# Patient Record
Sex: Male | Born: 1935 | Race: Black or African American | Hispanic: No | Marital: Married | Smoking: Never smoker
Health system: Southern US, Community
[De-identification: ages and names within clinical notes are randomized; demographics above are authoritative.]

## PROBLEM LIST (undated history)

## (undated) DIAGNOSIS — E785 Hyperlipidemia, unspecified: Secondary | ICD-10-CM

## (undated) DIAGNOSIS — N400 Enlarged prostate without lower urinary tract symptoms: Secondary | ICD-10-CM

## (undated) DIAGNOSIS — Z8679 Personal history of other diseases of the circulatory system: Secondary | ICD-10-CM

## (undated) DIAGNOSIS — F419 Anxiety disorder, unspecified: Secondary | ICD-10-CM

## (undated) DIAGNOSIS — I4892 Unspecified atrial flutter: Secondary | ICD-10-CM

## (undated) DIAGNOSIS — I471 Supraventricular tachycardia: Secondary | ICD-10-CM

## (undated) DIAGNOSIS — D649 Anemia, unspecified: Secondary | ICD-10-CM

## (undated) DIAGNOSIS — F0150 Vascular dementia without behavioral disturbance: Secondary | ICD-10-CM

## (undated) DIAGNOSIS — D132 Benign neoplasm of duodenum: Secondary | ICD-10-CM

## (undated) DIAGNOSIS — E119 Type 2 diabetes mellitus without complications: Secondary | ICD-10-CM

## (undated) DIAGNOSIS — T7840XA Allergy, unspecified, initial encounter: Secondary | ICD-10-CM

## (undated) DIAGNOSIS — Z9889 Other specified postprocedural states: Secondary | ICD-10-CM

## (undated) DIAGNOSIS — I10 Essential (primary) hypertension: Secondary | ICD-10-CM

## (undated) DIAGNOSIS — I5031 Acute diastolic (congestive) heart failure: Secondary | ICD-10-CM

## (undated) DIAGNOSIS — K219 Gastro-esophageal reflux disease without esophagitis: Secondary | ICD-10-CM

## (undated) DIAGNOSIS — M1990 Unspecified osteoarthritis, unspecified site: Secondary | ICD-10-CM

## (undated) DIAGNOSIS — I2510 Atherosclerotic heart disease of native coronary artery without angina pectoris: Secondary | ICD-10-CM

## (undated) HISTORY — DX: Benign prostatic hyperplasia without lower urinary tract symptoms: N40.0

## (undated) HISTORY — DX: Benign neoplasm of duodenum: D13.2

## (undated) HISTORY — DX: Hyperlipidemia, unspecified: E78.5

## (undated) HISTORY — PX: ANGIOPLASTY: SHX39

## (undated) HISTORY — DX: Supraventricular tachycardia: I47.1

## (undated) HISTORY — DX: Essential (primary) hypertension: I10

## (undated) HISTORY — DX: Type 2 diabetes mellitus without complications: E11.9

## (undated) HISTORY — DX: Unspecified atrial flutter: I48.92

## (undated) HISTORY — DX: Anxiety disorder, unspecified: F41.9

## (undated) HISTORY — DX: Anemia, unspecified: D64.9

## (undated) HISTORY — DX: Acute diastolic (congestive) heart failure: I50.31

## (undated) HISTORY — DX: Allergy, unspecified, initial encounter: T78.40XA

## (undated) HISTORY — DX: Personal history of other diseases of the circulatory system: Z86.79

## (undated) HISTORY — DX: Unspecified osteoarthritis, unspecified site: M19.90

## (undated) HISTORY — DX: Vascular dementia, unspecified severity, without behavioral disturbance, psychotic disturbance, mood disturbance, and anxiety: F01.50

## (undated) HISTORY — DX: Gastro-esophageal reflux disease without esophagitis: K21.9

## (undated) HISTORY — DX: Atherosclerotic heart disease of native coronary artery without angina pectoris: I25.10

## (undated) HISTORY — DX: Other specified postprocedural states: Z98.890

---

## 1997-05-21 ENCOUNTER — Inpatient Hospital Stay (HOSPITAL_COMMUNITY): Admission: EM | Admit: 1997-05-21 | Discharge: 1997-05-22 | Payer: Self-pay | Admitting: *Deleted

## 1998-03-09 ENCOUNTER — Encounter: Payer: Self-pay | Admitting: Internal Medicine

## 1998-03-09 LAB — CONVERTED CEMR LAB: Hgb A1c MFr Bld: 8.5 %

## 1999-07-01 DIAGNOSIS — I4892 Unspecified atrial flutter: Secondary | ICD-10-CM

## 1999-07-01 HISTORY — DX: Unspecified atrial flutter: I48.92

## 1999-07-16 ENCOUNTER — Inpatient Hospital Stay (HOSPITAL_COMMUNITY): Admission: EM | Admit: 1999-07-16 | Discharge: 1999-07-21 | Payer: Self-pay | Admitting: Emergency Medicine

## 1999-07-16 ENCOUNTER — Encounter: Payer: Self-pay | Admitting: Emergency Medicine

## 1999-07-17 ENCOUNTER — Encounter: Payer: Self-pay | Admitting: Cardiology

## 1999-07-21 ENCOUNTER — Encounter: Payer: Self-pay | Admitting: Cardiology

## 2000-01-31 HISTORY — PX: CORONARY ARTERY BYPASS GRAFT: SHX141

## 2000-02-18 ENCOUNTER — Inpatient Hospital Stay (HOSPITAL_COMMUNITY): Admission: EM | Admit: 2000-02-18 | Discharge: 2000-03-01 | Payer: Self-pay | Admitting: Emergency Medicine

## 2000-02-18 ENCOUNTER — Encounter: Payer: Self-pay | Admitting: *Deleted

## 2000-02-24 ENCOUNTER — Encounter: Payer: Self-pay | Admitting: Thoracic Surgery (Cardiothoracic Vascular Surgery)

## 2000-02-25 ENCOUNTER — Encounter: Payer: Self-pay | Admitting: Thoracic Surgery (Cardiothoracic Vascular Surgery)

## 2000-02-26 ENCOUNTER — Encounter: Payer: Self-pay | Admitting: Thoracic Surgery (Cardiothoracic Vascular Surgery)

## 2000-02-27 ENCOUNTER — Encounter: Payer: Self-pay | Admitting: Thoracic Surgery (Cardiothoracic Vascular Surgery)

## 2000-02-29 ENCOUNTER — Encounter: Payer: Self-pay | Admitting: Thoracic Surgery (Cardiothoracic Vascular Surgery)

## 2000-03-02 DIAGNOSIS — I4719 Other supraventricular tachycardia: Secondary | ICD-10-CM

## 2000-03-02 DIAGNOSIS — I471 Supraventricular tachycardia: Secondary | ICD-10-CM

## 2000-03-02 HISTORY — DX: Other supraventricular tachycardia: I47.19

## 2000-03-02 HISTORY — DX: Supraventricular tachycardia: I47.1

## 2000-03-18 ENCOUNTER — Encounter: Payer: Self-pay | Admitting: *Deleted

## 2000-03-18 ENCOUNTER — Encounter: Payer: Self-pay | Admitting: Cardiology

## 2000-03-18 ENCOUNTER — Inpatient Hospital Stay (HOSPITAL_COMMUNITY): Admission: EM | Admit: 2000-03-18 | Discharge: 2000-03-22 | Payer: Self-pay | Admitting: *Deleted

## 2000-03-28 ENCOUNTER — Encounter: Payer: Self-pay | Admitting: Thoracic Surgery (Cardiothoracic Vascular Surgery)

## 2000-03-28 ENCOUNTER — Encounter
Admission: RE | Admit: 2000-03-28 | Discharge: 2000-03-28 | Payer: Self-pay | Admitting: Thoracic Surgery (Cardiothoracic Vascular Surgery)

## 2000-06-14 ENCOUNTER — Encounter: Payer: Self-pay | Admitting: Internal Medicine

## 2000-06-14 LAB — CONVERTED CEMR LAB: Hgb A1c MFr Bld: 7 %

## 2000-07-28 ENCOUNTER — Encounter: Payer: Self-pay | Admitting: *Deleted

## 2000-07-28 ENCOUNTER — Emergency Department (HOSPITAL_COMMUNITY): Admission: EM | Admit: 2000-07-28 | Discharge: 2000-07-28 | Payer: Self-pay | Admitting: Emergency Medicine

## 2002-03-25 ENCOUNTER — Emergency Department (HOSPITAL_COMMUNITY): Admission: EM | Admit: 2002-03-25 | Discharge: 2002-03-26 | Payer: Self-pay | Admitting: Emergency Medicine

## 2002-03-26 ENCOUNTER — Encounter: Payer: Self-pay | Admitting: Emergency Medicine

## 2002-03-28 ENCOUNTER — Emergency Department (HOSPITAL_COMMUNITY): Admission: EM | Admit: 2002-03-28 | Discharge: 2002-03-29 | Payer: Self-pay | Admitting: Emergency Medicine

## 2002-03-28 ENCOUNTER — Encounter: Payer: Self-pay | Admitting: Emergency Medicine

## 2002-06-10 ENCOUNTER — Encounter: Payer: Self-pay | Admitting: Internal Medicine

## 2002-06-10 LAB — CONVERTED CEMR LAB: Hgb A1c MFr Bld: 11.2 %

## 2002-07-09 ENCOUNTER — Encounter: Payer: Self-pay | Admitting: Internal Medicine

## 2002-12-01 ENCOUNTER — Encounter: Payer: Self-pay | Admitting: Internal Medicine

## 2002-12-01 LAB — CONVERTED CEMR LAB: Hgb A1c MFr Bld: 9.8 %

## 2003-06-05 ENCOUNTER — Encounter: Payer: Self-pay | Admitting: Internal Medicine

## 2003-06-05 LAB — CONVERTED CEMR LAB: Hgb A1c MFr Bld: 9 %

## 2003-07-24 ENCOUNTER — Inpatient Hospital Stay (HOSPITAL_BASED_OUTPATIENT_CLINIC_OR_DEPARTMENT_OTHER): Admission: RE | Admit: 2003-07-24 | Discharge: 2003-07-24 | Payer: Self-pay | Admitting: Cardiology

## 2003-07-31 ENCOUNTER — Ambulatory Visit (HOSPITAL_COMMUNITY): Admission: RE | Admit: 2003-07-31 | Discharge: 2003-08-01 | Payer: Self-pay | Admitting: Cardiology

## 2003-07-31 HISTORY — PX: CORONARY STENT PLACEMENT: SHX1402

## 2003-11-24 ENCOUNTER — Inpatient Hospital Stay (HOSPITAL_COMMUNITY): Admission: EM | Admit: 2003-11-24 | Discharge: 2003-11-26 | Payer: Self-pay

## 2003-11-26 ENCOUNTER — Ambulatory Visit: Payer: Self-pay | Admitting: Cardiology

## 2003-12-02 ENCOUNTER — Ambulatory Visit: Payer: Self-pay | Admitting: Internal Medicine

## 2003-12-02 LAB — CONVERTED CEMR LAB: Hgb A1c MFr Bld: 8.8 %

## 2003-12-11 ENCOUNTER — Ambulatory Visit: Payer: Self-pay | Admitting: Cardiology

## 2003-12-15 ENCOUNTER — Ambulatory Visit: Payer: Self-pay | Admitting: *Deleted

## 2003-12-22 ENCOUNTER — Ambulatory Visit: Payer: Self-pay | Admitting: Internal Medicine

## 2003-12-26 ENCOUNTER — Emergency Department (HOSPITAL_COMMUNITY): Admission: EM | Admit: 2003-12-26 | Discharge: 2003-12-26 | Payer: Self-pay | Admitting: Emergency Medicine

## 2003-12-29 ENCOUNTER — Ambulatory Visit: Payer: Self-pay | Admitting: Cardiology

## 2003-12-31 ENCOUNTER — Ambulatory Visit: Payer: Self-pay | Admitting: Cardiology

## 2004-01-05 ENCOUNTER — Ambulatory Visit: Payer: Self-pay | Admitting: Cardiology

## 2004-01-11 ENCOUNTER — Ambulatory Visit: Payer: Self-pay | Admitting: Cardiology

## 2004-01-15 ENCOUNTER — Ambulatory Visit: Payer: Self-pay

## 2004-01-15 ENCOUNTER — Ambulatory Visit: Payer: Self-pay | Admitting: Cardiology

## 2004-01-18 ENCOUNTER — Ambulatory Visit: Payer: Self-pay | Admitting: Internal Medicine

## 2004-01-28 ENCOUNTER — Ambulatory Visit: Payer: Self-pay | Admitting: *Deleted

## 2004-02-03 ENCOUNTER — Ambulatory Visit: Payer: Self-pay | Admitting: Family Medicine

## 2004-02-15 ENCOUNTER — Ambulatory Visit: Payer: Self-pay | Admitting: Cardiovascular Disease

## 2004-03-10 ENCOUNTER — Ambulatory Visit: Payer: Self-pay | Admitting: Cardiology

## 2004-03-14 ENCOUNTER — Ambulatory Visit: Payer: Self-pay | Admitting: Internal Medicine

## 2004-04-14 ENCOUNTER — Ambulatory Visit: Payer: Self-pay | Admitting: Cardiology

## 2004-04-14 ENCOUNTER — Ambulatory Visit: Payer: Self-pay | Admitting: *Deleted

## 2004-04-19 ENCOUNTER — Ambulatory Visit: Payer: Self-pay | Admitting: Internal Medicine

## 2004-04-19 ENCOUNTER — Ambulatory Visit: Payer: Self-pay | Admitting: Cardiology

## 2004-04-20 ENCOUNTER — Inpatient Hospital Stay (HOSPITAL_BASED_OUTPATIENT_CLINIC_OR_DEPARTMENT_OTHER): Admission: RE | Admit: 2004-04-20 | Discharge: 2004-04-20 | Payer: Self-pay | Admitting: Cardiology

## 2004-04-20 ENCOUNTER — Ambulatory Visit: Payer: Self-pay | Admitting: Cardiology

## 2004-04-27 ENCOUNTER — Ambulatory Visit: Payer: Self-pay | Admitting: *Deleted

## 2004-05-09 ENCOUNTER — Ambulatory Visit: Payer: Self-pay | Admitting: Cardiology

## 2004-05-17 ENCOUNTER — Ambulatory Visit: Payer: Self-pay | Admitting: Internal Medicine

## 2004-05-23 ENCOUNTER — Ambulatory Visit: Payer: Self-pay | Admitting: Cardiology

## 2004-06-01 ENCOUNTER — Ambulatory Visit: Payer: Self-pay | Admitting: Cardiology

## 2004-06-13 ENCOUNTER — Ambulatory Visit: Payer: Self-pay | Admitting: Cardiology

## 2004-06-28 ENCOUNTER — Ambulatory Visit: Payer: Self-pay | Admitting: *Deleted

## 2004-07-21 ENCOUNTER — Ambulatory Visit: Payer: Self-pay | Admitting: Cardiology

## 2004-08-22 ENCOUNTER — Ambulatory Visit: Payer: Self-pay | Admitting: Cardiology

## 2004-09-08 ENCOUNTER — Ambulatory Visit: Payer: Self-pay | Admitting: Internal Medicine

## 2004-09-19 ENCOUNTER — Ambulatory Visit: Payer: Self-pay | Admitting: Cardiology

## 2004-09-29 ENCOUNTER — Ambulatory Visit: Payer: Self-pay | Admitting: Internal Medicine

## 2004-10-20 ENCOUNTER — Ambulatory Visit: Payer: Self-pay | Admitting: Cardiology

## 2004-10-28 ENCOUNTER — Ambulatory Visit: Payer: Self-pay | Admitting: Cardiology

## 2004-11-25 ENCOUNTER — Ambulatory Visit: Payer: Self-pay

## 2004-11-25 ENCOUNTER — Ambulatory Visit: Payer: Self-pay | Admitting: Internal Medicine

## 2004-12-12 ENCOUNTER — Ambulatory Visit: Payer: Self-pay | Admitting: Cardiology

## 2004-12-28 ENCOUNTER — Ambulatory Visit: Payer: Self-pay | Admitting: Cardiology

## 2005-01-04 ENCOUNTER — Ambulatory Visit: Payer: Self-pay | Admitting: Cardiology

## 2005-01-13 ENCOUNTER — Ambulatory Visit: Payer: Self-pay | Admitting: Internal Medicine

## 2005-01-19 ENCOUNTER — Ambulatory Visit: Payer: Self-pay | Admitting: Cardiology

## 2005-01-31 ENCOUNTER — Ambulatory Visit: Payer: Self-pay | Admitting: *Deleted

## 2005-02-14 ENCOUNTER — Ambulatory Visit: Payer: Self-pay | Admitting: Cardiology

## 2005-02-14 ENCOUNTER — Inpatient Hospital Stay (HOSPITAL_COMMUNITY): Admission: EM | Admit: 2005-02-14 | Discharge: 2005-02-17 | Payer: Self-pay | Admitting: Emergency Medicine

## 2005-02-20 ENCOUNTER — Ambulatory Visit: Payer: Self-pay | Admitting: Internal Medicine

## 2005-03-01 ENCOUNTER — Ambulatory Visit: Payer: Self-pay | Admitting: Cardiology

## 2005-03-01 ENCOUNTER — Ambulatory Visit: Payer: Self-pay

## 2005-03-16 ENCOUNTER — Ambulatory Visit: Payer: Self-pay | Admitting: Cardiology

## 2005-03-16 LAB — CONVERTED CEMR LAB
BUN: 30 mg/dL — ABNORMAL HIGH (ref 6–23)
CO2: 28 meq/L (ref 19–32)
Calcium: 9.6 mg/dL (ref 8.4–10.5)
Chloride: 102 meq/L (ref 96–112)
Creatinine, Ser: 2.1 mg/dL — ABNORMAL HIGH (ref 0.5–1.7)
Glucose, Bld: 154 mg/dL — ABNORMAL HIGH (ref 70–99)
Potassium: 4.5 meq/L (ref 3.5–5.5)
Sodium: 136 meq/L (ref 135–145)

## 2005-03-17 ENCOUNTER — Ambulatory Visit: Payer: Self-pay | Admitting: Cardiology

## 2005-03-17 ENCOUNTER — Inpatient Hospital Stay (HOSPITAL_COMMUNITY): Admission: EM | Admit: 2005-03-17 | Discharge: 2005-03-21 | Payer: Self-pay | Admitting: Emergency Medicine

## 2005-03-20 ENCOUNTER — Ambulatory Visit: Payer: Self-pay | Admitting: Internal Medicine

## 2005-03-24 ENCOUNTER — Ambulatory Visit: Payer: Self-pay | Admitting: Internal Medicine

## 2005-03-29 ENCOUNTER — Ambulatory Visit: Payer: Self-pay | Admitting: Cardiology

## 2005-04-13 ENCOUNTER — Ambulatory Visit: Payer: Self-pay | Admitting: Cardiology

## 2005-04-21 ENCOUNTER — Ambulatory Visit: Payer: Self-pay | Admitting: Internal Medicine

## 2005-04-26 ENCOUNTER — Encounter: Payer: Self-pay | Admitting: Internal Medicine

## 2005-04-26 ENCOUNTER — Ambulatory Visit: Payer: Self-pay | Admitting: Internal Medicine

## 2005-06-02 ENCOUNTER — Ambulatory Visit: Payer: Self-pay | Admitting: Internal Medicine

## 2005-07-01 ENCOUNTER — Observation Stay (HOSPITAL_COMMUNITY): Admission: EM | Admit: 2005-07-01 | Discharge: 2005-07-01 | Payer: Self-pay | Admitting: Emergency Medicine

## 2005-07-01 ENCOUNTER — Ambulatory Visit: Payer: Self-pay | Admitting: Cardiology

## 2005-07-06 ENCOUNTER — Ambulatory Visit: Payer: Self-pay

## 2005-07-14 ENCOUNTER — Ambulatory Visit: Payer: Self-pay | Admitting: Family Medicine

## 2005-08-11 ENCOUNTER — Ambulatory Visit: Payer: Self-pay | Admitting: Internal Medicine

## 2005-09-22 ENCOUNTER — Ambulatory Visit: Payer: Self-pay | Admitting: Cardiology

## 2005-09-25 ENCOUNTER — Ambulatory Visit: Payer: Self-pay | Admitting: Cardiology

## 2005-09-27 ENCOUNTER — Ambulatory Visit: Payer: Self-pay | Admitting: Cardiology

## 2005-09-28 ENCOUNTER — Ambulatory Visit: Payer: Self-pay | Admitting: Internal Medicine

## 2005-10-20 ENCOUNTER — Ambulatory Visit: Payer: Self-pay | Admitting: Cardiology

## 2005-12-30 HISTORY — PX: CIRCUMCISION: SUR203

## 2006-01-18 ENCOUNTER — Ambulatory Visit: Payer: Self-pay | Admitting: Family Medicine

## 2006-02-08 ENCOUNTER — Ambulatory Visit: Payer: Self-pay | Admitting: Internal Medicine

## 2006-02-09 ENCOUNTER — Encounter: Payer: Self-pay | Admitting: Internal Medicine

## 2006-02-09 LAB — CONVERTED CEMR LAB
ALT: 58 units/L — ABNORMAL HIGH (ref 0–40)
Albumin: 3.3 g/dL — ABNORMAL LOW (ref 3.5–5.2)
BUN: 17 mg/dL (ref 6–23)
CO2: 29 meq/L (ref 19–32)
Calcium: 8.9 mg/dL (ref 8.4–10.5)
Chloride: 111 meq/L (ref 96–112)
Chol/HDL Ratio, serum: 5
Cholesterol: 162 mg/dL (ref 0–200)
Creatinine, Ser: 1.8 mg/dL — ABNORMAL HIGH (ref 0.4–1.5)
GFR calc non Af Amer: 40 mL/min
Glomerular Filtration Rate, Af Am: 48 mL/min/{1.73_m2}
Glucose, Bld: 97 mg/dL (ref 70–99)
HDL: 32.3 mg/dL — ABNORMAL LOW (ref >39.0)
Hgb A1c MFr Bld: 8.1 %
Hgb A1c MFr Bld: 8.1 % — ABNORMAL HIGH (ref 4.6–6.0)
LDL Cholesterol: 104 mg/dL — ABNORMAL HIGH (ref 0–99)
Phosphorus Concentration: 2.7 mg/dL (ref 2.3–4.6)
Potassium: 4.7 meq/L (ref 3.5–5.1)
Sodium: 143 meq/L (ref 135–145)
Triglyceride fasting, serum: 129 mg/dL (ref 0–149)
VLDL: 26 mg/dL (ref 0–40)

## 2006-03-19 ENCOUNTER — Ambulatory Visit: Payer: Self-pay | Admitting: Internal Medicine

## 2006-04-19 ENCOUNTER — Ambulatory Visit: Payer: Self-pay | Admitting: Internal Medicine

## 2006-05-28 ENCOUNTER — Ambulatory Visit: Payer: Self-pay | Admitting: Internal Medicine

## 2006-05-28 DIAGNOSIS — I25810 Atherosclerosis of coronary artery bypass graft(s) without angina pectoris: Secondary | ICD-10-CM | POA: Insufficient documentation

## 2006-05-28 LAB — CONVERTED CEMR LAB
INR: 1.3
INR: 1.3
Prothrombin Time: 13.9 s
Prothrombin Time: 13.9 s

## 2006-05-29 ENCOUNTER — Encounter: Payer: Self-pay | Admitting: Internal Medicine

## 2006-05-29 DIAGNOSIS — D126 Benign neoplasm of colon, unspecified: Secondary | ICD-10-CM

## 2006-05-29 DIAGNOSIS — K219 Gastro-esophageal reflux disease without esophagitis: Secondary | ICD-10-CM | POA: Insufficient documentation

## 2006-05-29 DIAGNOSIS — F411 Generalized anxiety disorder: Secondary | ICD-10-CM | POA: Insufficient documentation

## 2006-05-29 DIAGNOSIS — E119 Type 2 diabetes mellitus without complications: Secondary | ICD-10-CM | POA: Insufficient documentation

## 2006-05-29 DIAGNOSIS — E7800 Pure hypercholesterolemia, unspecified: Secondary | ICD-10-CM

## 2006-05-29 DIAGNOSIS — M479 Spondylosis, unspecified: Secondary | ICD-10-CM | POA: Insufficient documentation

## 2006-05-29 DIAGNOSIS — I10 Essential (primary) hypertension: Secondary | ICD-10-CM | POA: Insufficient documentation

## 2006-05-29 DIAGNOSIS — J309 Allergic rhinitis, unspecified: Secondary | ICD-10-CM | POA: Insufficient documentation

## 2006-06-05 ENCOUNTER — Ambulatory Visit: Payer: Self-pay | Admitting: Internal Medicine

## 2006-06-05 ENCOUNTER — Ambulatory Visit: Payer: Self-pay | Admitting: *Deleted

## 2006-06-05 DIAGNOSIS — I80299 Phlebitis and thrombophlebitis of other deep vessels of unspecified lower extremity: Secondary | ICD-10-CM

## 2006-06-11 ENCOUNTER — Telehealth (INDEPENDENT_AMBULATORY_CARE_PROVIDER_SITE_OTHER): Payer: Self-pay | Admitting: *Deleted

## 2006-06-12 ENCOUNTER — Ambulatory Visit: Payer: Self-pay | Admitting: Internal Medicine

## 2006-06-12 LAB — CONVERTED CEMR LAB
INR: 2.2
Prothrombin Time: 18.3 s

## 2006-06-18 ENCOUNTER — Ambulatory Visit: Payer: Self-pay | Admitting: Internal Medicine

## 2006-06-18 DIAGNOSIS — M1990 Unspecified osteoarthritis, unspecified site: Secondary | ICD-10-CM | POA: Insufficient documentation

## 2006-06-27 ENCOUNTER — Ambulatory Visit: Payer: Self-pay | Admitting: Internal Medicine

## 2006-06-27 LAB — CONVERTED CEMR LAB
INR: 2.6
Prothrombin Time: 19.5 s

## 2006-07-25 ENCOUNTER — Ambulatory Visit: Payer: Self-pay | Admitting: Internal Medicine

## 2006-07-25 LAB — CONVERTED CEMR LAB
INR: 2.3
Prothrombin Time: 18.5 s

## 2006-08-27 ENCOUNTER — Ambulatory Visit: Payer: Self-pay | Admitting: Internal Medicine

## 2006-08-27 LAB — CONVERTED CEMR LAB
INR: 2.6
Prothrombin Time: 19.4 s

## 2006-10-22 ENCOUNTER — Ambulatory Visit: Payer: Self-pay | Admitting: Internal Medicine

## 2006-11-20 ENCOUNTER — Ambulatory Visit: Payer: Self-pay | Admitting: Internal Medicine

## 2006-11-20 LAB — CONVERTED CEMR LAB
INR: 3
Prothrombin Time: 21 s

## 2006-12-07 ENCOUNTER — Ambulatory Visit: Payer: Self-pay | Admitting: Internal Medicine

## 2006-12-17 ENCOUNTER — Ambulatory Visit: Payer: Self-pay | Admitting: Internal Medicine

## 2006-12-17 LAB — CONVERTED CEMR LAB
INR: 2.3
Prothrombin Time: 18.5 s

## 2007-01-21 ENCOUNTER — Ambulatory Visit: Payer: Self-pay | Admitting: Internal Medicine

## 2007-01-21 LAB — CONVERTED CEMR LAB
INR: 2.5
Prothrombin Time: 19.3 s

## 2007-01-23 LAB — CONVERTED CEMR LAB
ALT: 109 units/L — ABNORMAL HIGH (ref 0–53)
Albumin: 3.4 g/dL — ABNORMAL LOW (ref 3.5–5.2)
BUN: 19 mg/dL (ref 6–23)
CO2: 29 meq/L (ref 19–32)
Calcium: 9.2 mg/dL (ref 8.4–10.5)
Chloride: 104 meq/L (ref 96–112)
Cholesterol: 181 mg/dL (ref 0–200)
Creatinine, Ser: 1.4 mg/dL (ref 0.4–1.5)
GFR calc non Af Amer: 53 mL/min
Hgb A1c MFr Bld: 8 % — ABNORMAL HIGH (ref 4.6–6.0)
Phosphorus: 2.7 mg/dL (ref 2.3–4.6)
Total CHOL/HDL Ratio: 4.8

## 2007-02-11 ENCOUNTER — Ambulatory Visit: Payer: Self-pay | Admitting: Internal Medicine

## 2007-02-11 DIAGNOSIS — R945 Abnormal results of liver function studies: Secondary | ICD-10-CM | POA: Insufficient documentation

## 2007-02-12 ENCOUNTER — Encounter (INDEPENDENT_AMBULATORY_CARE_PROVIDER_SITE_OTHER): Payer: Self-pay | Admitting: *Deleted

## 2007-02-12 LAB — CONVERTED CEMR LAB
ALT: 141 units/L — ABNORMAL HIGH (ref 0–53)
Albumin: 3.5 g/dL (ref 3.5–5.2)
Alkaline Phosphatase: 90 units/L (ref 39–117)
Total Protein: 7.1 g/dL (ref 6.0–8.3)

## 2007-02-13 ENCOUNTER — Ambulatory Visit: Payer: Self-pay | Admitting: Cardiology

## 2007-02-22 ENCOUNTER — Ambulatory Visit: Payer: Self-pay | Admitting: Cardiology

## 2007-03-01 ENCOUNTER — Ambulatory Visit: Payer: Self-pay | Admitting: Internal Medicine

## 2007-03-01 LAB — CONVERTED CEMR LAB: INR: 2

## 2007-03-04 ENCOUNTER — Encounter: Payer: Self-pay | Admitting: Internal Medicine

## 2007-03-29 ENCOUNTER — Ambulatory Visit: Payer: Self-pay | Admitting: Internal Medicine

## 2007-03-29 LAB — CONVERTED CEMR LAB
INR: 1.5
Prothrombin Time: 15.1 s

## 2007-04-08 ENCOUNTER — Ambulatory Visit: Payer: Self-pay | Admitting: Internal Medicine

## 2007-04-08 LAB — CONVERTED CEMR LAB: INR: 1.8

## 2007-04-15 ENCOUNTER — Ambulatory Visit: Payer: Self-pay | Admitting: Internal Medicine

## 2007-04-15 ENCOUNTER — Telehealth (INDEPENDENT_AMBULATORY_CARE_PROVIDER_SITE_OTHER): Payer: Self-pay | Admitting: *Deleted

## 2007-04-22 ENCOUNTER — Telehealth (INDEPENDENT_AMBULATORY_CARE_PROVIDER_SITE_OTHER): Payer: Self-pay | Admitting: *Deleted

## 2007-05-01 LAB — HM DIABETES EYE EXAM: HM Diabetic Eye Exam: NORMAL

## 2007-05-31 ENCOUNTER — Ambulatory Visit: Payer: Self-pay | Admitting: Internal Medicine

## 2007-06-01 LAB — CONVERTED CEMR LAB
Albumin: 3.9 g/dL (ref 3.5–5.2)
BUN: 17 mg/dL (ref 6–23)
CO2: 23 meq/L (ref 19–32)
Chloride: 106 meq/L (ref 96–112)
Creatinine, Ser: 1.76 mg/dL — ABNORMAL HIGH (ref 0.40–1.50)
Phosphorus: 2.8 mg/dL (ref 2.3–4.6)

## 2007-06-03 LAB — CONVERTED CEMR LAB
ALT: 81 units/L — ABNORMAL HIGH (ref 0–53)
AST: 82 units/L — ABNORMAL HIGH (ref 0–37)
Albumin: 3.9 g/dL (ref 3.5–5.2)
Alkaline Phosphatase: 85 units/L (ref 39–117)
Hgb A1c MFr Bld: 8 % — ABNORMAL HIGH (ref 4.6–6.1)
Total Bilirubin: 0.4 mg/dL (ref 0.3–1.2)

## 2007-06-27 ENCOUNTER — Telehealth (INDEPENDENT_AMBULATORY_CARE_PROVIDER_SITE_OTHER): Payer: Self-pay | Admitting: *Deleted

## 2007-07-01 ENCOUNTER — Telehealth (INDEPENDENT_AMBULATORY_CARE_PROVIDER_SITE_OTHER): Payer: Self-pay | Admitting: *Deleted

## 2007-07-19 ENCOUNTER — Encounter: Payer: Self-pay | Admitting: Internal Medicine

## 2007-08-15 ENCOUNTER — Ambulatory Visit: Payer: Self-pay | Admitting: Family Medicine

## 2007-08-30 ENCOUNTER — Ambulatory Visit: Payer: Self-pay | Admitting: Cardiology

## 2007-08-30 LAB — CONVERTED CEMR LAB
AST: 60 units/L — ABNORMAL HIGH (ref 0–37)
Alkaline Phosphatase: 81 units/L (ref 39–117)
Bilirubin, Direct: 0.1 mg/dL (ref 0.0–0.3)
Lipase: 32 units/L (ref 11.0–59.0)
Total CHOL/HDL Ratio: 6.8
VLDL: 23 mg/dL (ref 0–40)

## 2007-09-02 ENCOUNTER — Ambulatory Visit (HOSPITAL_COMMUNITY): Admission: RE | Admit: 2007-09-02 | Discharge: 2007-09-02 | Payer: Self-pay | Admitting: Cardiology

## 2007-09-02 ENCOUNTER — Ambulatory Visit: Payer: Self-pay | Admitting: Cardiology

## 2007-09-02 ENCOUNTER — Inpatient Hospital Stay (HOSPITAL_COMMUNITY): Admission: EM | Admit: 2007-09-02 | Discharge: 2007-09-06 | Payer: Self-pay | Admitting: Emergency Medicine

## 2007-09-12 DIAGNOSIS — R1011 Right upper quadrant pain: Secondary | ICD-10-CM

## 2007-09-12 DIAGNOSIS — K5730 Diverticulosis of large intestine without perforation or abscess without bleeding: Secondary | ICD-10-CM | POA: Insufficient documentation

## 2007-09-12 DIAGNOSIS — N259 Disorder resulting from impaired renal tubular function, unspecified: Secondary | ICD-10-CM | POA: Insufficient documentation

## 2007-09-13 ENCOUNTER — Ambulatory Visit: Payer: Self-pay | Admitting: Family Medicine

## 2007-09-13 LAB — CONVERTED CEMR LAB: Prothrombin Time: 13.6 s

## 2007-09-17 ENCOUNTER — Ambulatory Visit: Payer: Self-pay | Admitting: Internal Medicine

## 2007-10-03 ENCOUNTER — Ambulatory Visit: Payer: Self-pay | Admitting: Internal Medicine

## 2007-10-09 ENCOUNTER — Ambulatory Visit: Payer: Self-pay | Admitting: Cardiology

## 2007-10-17 ENCOUNTER — Ambulatory Visit: Payer: Self-pay | Admitting: Internal Medicine

## 2007-10-17 LAB — CONVERTED CEMR LAB
INR: 1.5
Prothrombin Time: 15.1 s

## 2007-10-25 ENCOUNTER — Ambulatory Visit: Payer: Self-pay | Admitting: Internal Medicine

## 2007-10-31 ENCOUNTER — Ambulatory Visit: Payer: Self-pay | Admitting: Internal Medicine

## 2007-10-31 LAB — CONVERTED CEMR LAB: Prothrombin Time: 22 s

## 2007-11-28 ENCOUNTER — Ambulatory Visit: Payer: Self-pay | Admitting: Internal Medicine

## 2007-11-28 LAB — CONVERTED CEMR LAB: INR: 2.2

## 2007-12-30 ENCOUNTER — Ambulatory Visit: Payer: Self-pay | Admitting: Internal Medicine

## 2007-12-30 LAB — CONVERTED CEMR LAB
INR: 2.1
Prothrombin Time: 17.8 s

## 2007-12-31 DIAGNOSIS — D132 Benign neoplasm of duodenum: Secondary | ICD-10-CM

## 2007-12-31 HISTORY — DX: Benign neoplasm of duodenum: D13.2

## 2008-01-07 ENCOUNTER — Ambulatory Visit: Payer: Self-pay | Admitting: Internal Medicine

## 2008-01-21 ENCOUNTER — Ambulatory Visit: Payer: Self-pay | Admitting: Internal Medicine

## 2008-01-21 ENCOUNTER — Encounter: Payer: Self-pay | Admitting: Internal Medicine

## 2008-01-21 LAB — HM COLONOSCOPY

## 2008-01-22 ENCOUNTER — Encounter: Payer: Self-pay | Admitting: Internal Medicine

## 2008-01-22 ENCOUNTER — Telehealth: Payer: Self-pay | Admitting: Internal Medicine

## 2008-01-22 ENCOUNTER — Telehealth (INDEPENDENT_AMBULATORY_CARE_PROVIDER_SITE_OTHER): Payer: Self-pay | Admitting: *Deleted

## 2008-01-27 ENCOUNTER — Ambulatory Visit: Payer: Self-pay | Admitting: Internal Medicine

## 2008-01-27 ENCOUNTER — Telehealth (INDEPENDENT_AMBULATORY_CARE_PROVIDER_SITE_OTHER): Payer: Self-pay | Admitting: *Deleted

## 2008-01-27 ENCOUNTER — Telehealth: Payer: Self-pay | Admitting: Internal Medicine

## 2008-01-27 LAB — CONVERTED CEMR LAB
INR: 1.7
Prothrombin Time: 15.9 s

## 2008-02-03 ENCOUNTER — Ambulatory Visit: Payer: Self-pay | Admitting: Internal Medicine

## 2008-02-07 ENCOUNTER — Ambulatory Visit: Payer: Self-pay | Admitting: Internal Medicine

## 2008-02-07 LAB — CONVERTED CEMR LAB: INR: 2.1

## 2008-02-12 ENCOUNTER — Ambulatory Visit: Payer: Self-pay | Admitting: Internal Medicine

## 2008-02-13 ENCOUNTER — Encounter: Payer: Self-pay | Admitting: Internal Medicine

## 2008-02-13 LAB — CONVERTED CEMR LAB
AST: 51 units/L — ABNORMAL HIGH (ref 0–37)
Alkaline Phosphatase: 74 units/L (ref 39–117)
Basophils Absolute: 0 10*3/uL (ref 0.0–0.1)
Basophils Relative: 0.6 % (ref 0.0–3.0)
CO2: 29 meq/L (ref 19–32)
Calcium: 9.6 mg/dL (ref 8.4–10.5)
Creatinine, Ser: 1.6 mg/dL — ABNORMAL HIGH (ref 0.4–1.5)
Direct LDL: 172.4 mg/dL
Glucose, Bld: 198 mg/dL — ABNORMAL HIGH (ref 70–99)
HDL: 37.2 mg/dL — ABNORMAL LOW (ref >39.0)
Hgb A1c MFr Bld: 8.7 % — ABNORMAL HIGH (ref 4.6–6.0)
Lymphocytes Relative: 21.2 % (ref 12.0–46.0)
MCHC: 34.8 g/dL (ref 30.0–36.0)
Neutrophils Relative %: 67.3 % (ref 43.0–77.0)
RBC: 4.24 M/uL (ref 4.22–5.81)
Total Bilirubin: 0.7 mg/dL (ref 0.3–1.2)
Total CHOL/HDL Ratio: 6.5
VLDL: 29 mg/dL (ref 0–40)

## 2008-02-21 ENCOUNTER — Ambulatory Visit: Payer: Self-pay | Admitting: Internal Medicine

## 2008-02-21 ENCOUNTER — Telehealth: Payer: Self-pay | Admitting: Internal Medicine

## 2008-02-21 LAB — CONVERTED CEMR LAB
INR: 2.6
Prothrombin Time: 19.5 s

## 2008-03-02 ENCOUNTER — Telehealth: Payer: Self-pay | Admitting: Internal Medicine

## 2008-03-03 ENCOUNTER — Encounter: Payer: Self-pay | Admitting: Internal Medicine

## 2008-03-13 ENCOUNTER — Encounter (INDEPENDENT_AMBULATORY_CARE_PROVIDER_SITE_OTHER): Payer: Self-pay | Admitting: *Deleted

## 2008-03-27 ENCOUNTER — Ambulatory Visit: Payer: Self-pay | Admitting: Internal Medicine

## 2008-04-05 DIAGNOSIS — I5032 Chronic diastolic (congestive) heart failure: Secondary | ICD-10-CM

## 2008-04-06 ENCOUNTER — Ambulatory Visit: Payer: Self-pay | Admitting: Cardiology

## 2008-04-06 ENCOUNTER — Encounter: Payer: Self-pay | Admitting: Cardiology

## 2008-04-24 ENCOUNTER — Telehealth: Payer: Self-pay | Admitting: Internal Medicine

## 2008-04-24 ENCOUNTER — Ambulatory Visit: Payer: Self-pay | Admitting: Internal Medicine

## 2008-05-25 ENCOUNTER — Telehealth: Payer: Self-pay | Admitting: Internal Medicine

## 2008-05-25 ENCOUNTER — Ambulatory Visit: Payer: Self-pay | Admitting: Internal Medicine

## 2008-05-26 ENCOUNTER — Encounter: Payer: Self-pay | Admitting: Internal Medicine

## 2008-06-04 ENCOUNTER — Encounter: Payer: Self-pay | Admitting: Internal Medicine

## 2008-06-08 ENCOUNTER — Ambulatory Visit: Payer: Self-pay | Admitting: Internal Medicine

## 2008-06-08 LAB — CONVERTED CEMR LAB
INR: 4.9
Prothrombin Time: 26.7 s

## 2008-06-12 ENCOUNTER — Ambulatory Visit: Payer: Self-pay | Admitting: Internal Medicine

## 2008-06-15 LAB — CONVERTED CEMR LAB
Albumin: 3.4 g/dL — ABNORMAL LOW (ref 3.5–5.2)
BUN: 19 mg/dL (ref 6–23)
CO2: 31 meq/L (ref 19–32)
Chloride: 107 meq/L (ref 96–112)
Cholesterol: 154 mg/dL (ref 0–200)
Total CHOL/HDL Ratio: 4
Triglycerides: 67 mg/dL (ref 0.0–149.0)

## 2008-07-01 ENCOUNTER — Ambulatory Visit: Payer: Self-pay | Admitting: Internal Medicine

## 2008-07-01 LAB — CONVERTED CEMR LAB: Prothrombin Time: 22 s

## 2008-07-14 ENCOUNTER — Ambulatory Visit: Payer: Self-pay | Admitting: Family Medicine

## 2008-07-14 LAB — CONVERTED CEMR LAB
Basophils Relative: 0 % (ref 0.0–3.0)
Eosinophils Relative: 1.3 % (ref 0.0–5.0)
GFR calc non Af Amer: 37.68 mL/min (ref >60)
Glucose, Bld: 98 mg/dL (ref 70–99)
Glucose, Urine, Semiquant: NEGATIVE
HCT: 35.9 % — ABNORMAL LOW (ref 39.0–52.0)
Hemoglobin: 12.3 g/dL — ABNORMAL LOW (ref 13.0–17.0)
Ketones, urine, test strip: NEGATIVE
Lymphocytes Relative: 22.8 % (ref 12.0–46.0)
Lymphs Abs: 1.3 10*3/uL (ref 0.7–4.0)
Monocytes Relative: 9.7 % (ref 3.0–12.0)
Neutro Abs: 3.9 10*3/uL (ref 1.4–7.7)
Potassium: 4.3 meq/L (ref 3.5–5.1)
RBC: 4.02 M/uL — ABNORMAL LOW (ref 4.22–5.81)
RDW: 14.4 % (ref 11.5–14.6)
Sodium: 140 meq/L (ref 135–145)
Urobilinogen, UA: 0.2
WBC: 5.9 10*3/uL (ref 4.5–10.5)
pH: 6

## 2008-07-15 ENCOUNTER — Encounter: Payer: Self-pay | Admitting: Family Medicine

## 2008-07-20 ENCOUNTER — Ambulatory Visit: Payer: Self-pay | Admitting: Internal Medicine

## 2008-07-20 ENCOUNTER — Telehealth: Payer: Self-pay | Admitting: Internal Medicine

## 2008-07-20 LAB — CONVERTED CEMR LAB
Bilirubin Urine: NEGATIVE
Ketones, urine, test strip: NEGATIVE
Protein, U semiquant: NEGATIVE
RBC / HPF: 0
WBC, UA: 0 cells/hpf

## 2008-07-30 ENCOUNTER — Ambulatory Visit: Payer: Self-pay | Admitting: Internal Medicine

## 2008-07-30 LAB — CONVERTED CEMR LAB: Prothrombin Time: 20.5 s

## 2008-08-07 ENCOUNTER — Ambulatory Visit: Payer: Self-pay | Admitting: Family Medicine

## 2008-08-07 LAB — CONVERTED CEMR LAB: Rapid Strep: NEGATIVE

## 2008-08-22 ENCOUNTER — Inpatient Hospital Stay (HOSPITAL_COMMUNITY): Admission: EM | Admit: 2008-08-22 | Discharge: 2008-08-24 | Payer: Self-pay | Admitting: Emergency Medicine

## 2008-08-22 ENCOUNTER — Ambulatory Visit: Payer: Self-pay | Admitting: Internal Medicine

## 2008-08-27 ENCOUNTER — Ambulatory Visit: Payer: Self-pay | Admitting: Internal Medicine

## 2008-08-27 LAB — CONVERTED CEMR LAB
INR: 1.9
Prothrombin Time: 16.8 s

## 2008-09-03 ENCOUNTER — Ambulatory Visit: Payer: Self-pay | Admitting: Internal Medicine

## 2008-09-03 LAB — CONVERTED CEMR LAB: INR: 3.5

## 2008-09-17 ENCOUNTER — Encounter: Payer: Self-pay | Admitting: Internal Medicine

## 2008-09-17 ENCOUNTER — Ambulatory Visit: Payer: Self-pay | Admitting: Internal Medicine

## 2008-09-17 LAB — CONVERTED CEMR LAB
INR: 6.7
Prothrombin Time: 69.3 s (ref 9.1–11.7)
Prothrombin Time: 31.2 s

## 2008-09-21 ENCOUNTER — Ambulatory Visit: Payer: Self-pay | Admitting: Internal Medicine

## 2008-09-28 ENCOUNTER — Ambulatory Visit: Payer: Self-pay | Admitting: Internal Medicine

## 2008-10-26 ENCOUNTER — Ambulatory Visit: Payer: Self-pay | Admitting: Cardiology

## 2008-10-26 DIAGNOSIS — I7389 Other specified peripheral vascular diseases: Secondary | ICD-10-CM

## 2008-10-30 ENCOUNTER — Ambulatory Visit: Payer: Self-pay

## 2008-10-30 ENCOUNTER — Encounter: Payer: Self-pay | Admitting: Cardiology

## 2008-11-02 ENCOUNTER — Ambulatory Visit: Payer: Self-pay | Admitting: Internal Medicine

## 2008-11-02 DIAGNOSIS — N400 Enlarged prostate without lower urinary tract symptoms: Secondary | ICD-10-CM | POA: Insufficient documentation

## 2008-11-02 LAB — HM DIABETES FOOT EXAM

## 2008-11-03 LAB — CONVERTED CEMR LAB
Bilirubin, Direct: 0 mg/dL (ref 0.0–0.3)
CO2: 28 meq/L (ref 19–32)
Chloride: 107 meq/L (ref 96–112)
Cholesterol: 163 mg/dL (ref 0–200)
Eosinophils Relative: 1.5 % (ref 0.0–5.0)
GFR calc non Af Amer: 27.37 mL/min (ref >60)
HCT: 33.7 % — ABNORMAL LOW (ref 39.0–52.0)
Hemoglobin: 11.4 g/dL — ABNORMAL LOW (ref 13.0–17.0)
Hgb A1c MFr Bld: 7.7 % — ABNORMAL HIGH (ref 4.6–6.5)
LDL Cholesterol: 106 mg/dL — ABNORMAL HIGH (ref 0–99)
Lymphs Abs: 1.1 10*3/uL (ref 0.7–4.0)
Monocytes Relative: 9.3 % (ref 3.0–12.0)
Neutro Abs: 4.4 10*3/uL (ref 1.4–7.7)
Platelets: 157 10*3/uL (ref 150.0–400.0)
Potassium: 4.6 meq/L (ref 3.5–5.1)
RBC: 3.76 M/uL — ABNORMAL LOW (ref 4.22–5.81)
Sodium: 140 meq/L (ref 135–145)
Total Protein: 7 g/dL (ref 6.0–8.3)
VLDL: 23 mg/dL (ref 0.0–40.0)
WBC: 6.2 10*3/uL (ref 4.5–10.5)

## 2008-11-11 ENCOUNTER — Encounter: Payer: Self-pay | Admitting: Internal Medicine

## 2008-11-16 ENCOUNTER — Ambulatory Visit: Payer: Self-pay | Admitting: Cardiovascular Disease

## 2008-11-16 DIAGNOSIS — I70219 Atherosclerosis of native arteries of extremities with intermittent claudication, unspecified extremity: Secondary | ICD-10-CM | POA: Insufficient documentation

## 2008-11-30 ENCOUNTER — Ambulatory Visit: Payer: Self-pay | Admitting: Internal Medicine

## 2008-11-30 LAB — CONVERTED CEMR LAB: Prothrombin Time: 17.3 s

## 2008-12-18 ENCOUNTER — Ambulatory Visit: Payer: Self-pay | Admitting: Internal Medicine

## 2008-12-18 DIAGNOSIS — M1710 Unilateral primary osteoarthritis, unspecified knee: Secondary | ICD-10-CM | POA: Insufficient documentation

## 2008-12-28 ENCOUNTER — Telehealth: Payer: Self-pay | Admitting: Internal Medicine

## 2008-12-28 ENCOUNTER — Ambulatory Visit: Payer: Self-pay | Admitting: Internal Medicine

## 2008-12-28 LAB — CONVERTED CEMR LAB
Glucose, Bld: 441 mg/dL
Glucose, Urine, Semiquant: >=1000
Specific Gravity, Urine: 1.01
WBC Urine, dipstick: NEGATIVE
pH: 6

## 2008-12-29 ENCOUNTER — Ambulatory Visit: Payer: Self-pay | Admitting: Internal Medicine

## 2009-01-27 ENCOUNTER — Ambulatory Visit: Payer: Self-pay | Admitting: Internal Medicine

## 2009-02-17 ENCOUNTER — Encounter: Payer: Self-pay | Admitting: Internal Medicine

## 2009-03-01 ENCOUNTER — Ambulatory Visit: Payer: Self-pay | Admitting: Internal Medicine

## 2009-03-01 ENCOUNTER — Ambulatory Visit: Payer: Self-pay | Admitting: Family Medicine

## 2009-03-01 DIAGNOSIS — M19019 Primary osteoarthritis, unspecified shoulder: Secondary | ICD-10-CM | POA: Insufficient documentation

## 2009-03-29 ENCOUNTER — Encounter: Payer: Self-pay | Admitting: Internal Medicine

## 2009-04-06 ENCOUNTER — Ambulatory Visit: Payer: Self-pay | Admitting: Family Medicine

## 2009-04-06 ENCOUNTER — Ambulatory Visit: Payer: Self-pay | Admitting: Internal Medicine

## 2009-04-06 LAB — CONVERTED CEMR LAB: INR: 3.7

## 2009-05-05 ENCOUNTER — Ambulatory Visit: Payer: Self-pay | Admitting: Internal Medicine

## 2009-06-02 ENCOUNTER — Ambulatory Visit: Payer: Self-pay | Admitting: Internal Medicine

## 2009-06-02 LAB — CONVERTED CEMR LAB: INR: 3.1

## 2009-06-08 ENCOUNTER — Encounter: Payer: Self-pay | Admitting: Internal Medicine

## 2009-06-30 ENCOUNTER — Encounter: Payer: Self-pay | Admitting: Cardiovascular Disease

## 2009-06-30 ENCOUNTER — Ambulatory Visit: Payer: Self-pay | Admitting: Internal Medicine

## 2009-06-30 LAB — CONVERTED CEMR LAB
INR: 2.5
Prothrombin Time: 29.8 s

## 2009-07-28 ENCOUNTER — Ambulatory Visit: Payer: Self-pay | Admitting: Internal Medicine

## 2009-07-28 LAB — CONVERTED CEMR LAB: INR: 2.7

## 2009-08-27 ENCOUNTER — Ambulatory Visit: Payer: Self-pay | Admitting: Internal Medicine

## 2009-08-27 LAB — CONVERTED CEMR LAB: Prothrombin Time: 51.5 s

## 2009-09-03 ENCOUNTER — Encounter: Payer: Self-pay | Admitting: Internal Medicine

## 2009-09-10 ENCOUNTER — Ambulatory Visit: Payer: Self-pay | Admitting: Internal Medicine

## 2009-09-10 LAB — CONVERTED CEMR LAB: Prothrombin Time: 33 s

## 2009-09-16 ENCOUNTER — Ambulatory Visit: Payer: Self-pay | Admitting: Internal Medicine

## 2009-09-16 ENCOUNTER — Ambulatory Visit: Payer: Self-pay | Admitting: Family Medicine

## 2009-10-08 ENCOUNTER — Telehealth (INDEPENDENT_AMBULATORY_CARE_PROVIDER_SITE_OTHER): Payer: Self-pay | Admitting: *Deleted

## 2009-10-08 ENCOUNTER — Ambulatory Visit: Payer: Self-pay | Admitting: Internal Medicine

## 2009-10-08 LAB — CONVERTED CEMR LAB
INR: 3.1
Prothrombin Time: 37.2 s

## 2009-10-20 ENCOUNTER — Ambulatory Visit: Payer: Self-pay | Admitting: Family Medicine

## 2009-10-20 DIAGNOSIS — M75120 Complete rotator cuff tear or rupture of unspecified shoulder, not specified as traumatic: Secondary | ICD-10-CM | POA: Insufficient documentation

## 2009-11-05 ENCOUNTER — Ambulatory Visit: Payer: Self-pay | Admitting: Internal Medicine

## 2009-12-03 ENCOUNTER — Ambulatory Visit: Payer: Self-pay | Admitting: Internal Medicine

## 2009-12-03 LAB — CONVERTED CEMR LAB: INR: 3.2

## 2009-12-20 ENCOUNTER — Encounter (INDEPENDENT_AMBULATORY_CARE_PROVIDER_SITE_OTHER): Payer: Self-pay | Admitting: *Deleted

## 2009-12-31 ENCOUNTER — Ambulatory Visit: Payer: Self-pay | Admitting: Internal Medicine

## 2009-12-31 LAB — CONVERTED CEMR LAB: INR: 3.9

## 2010-01-03 LAB — CONVERTED CEMR LAB
ALT: 98 units/L — ABNORMAL HIGH (ref 0–53)
AST: 85 units/L — ABNORMAL HIGH (ref 0–37)
Alkaline Phosphatase: 96 units/L (ref 39–117)
Bilirubin, Direct: 0.1 mg/dL (ref 0.0–0.3)
CO2: 28 meq/L (ref 19–32)
Creatinine, Ser: 2.5 mg/dL — ABNORMAL HIGH (ref 0.4–1.5)
Eosinophils Relative: 1.8 % (ref 0.0–5.0)
GFR calc non Af Amer: 31.8 mL/min (ref >60)
HCT: 34.3 % — ABNORMAL LOW (ref 39.0–52.0)
Hgb A1c MFr Bld: 11.4 % — ABNORMAL HIGH (ref 4.6–6.5)
LDL Cholesterol: 119 mg/dL — ABNORMAL HIGH (ref 0–99)
Lymphocytes Relative: 24.2 % (ref 12.0–46.0)
Monocytes Relative: 10.4 % (ref 3.0–12.0)
Neutrophils Relative %: 63.2 % (ref 43.0–77.0)
Phosphorus: 3.2 mg/dL (ref 2.3–4.6)
Platelets: 135 10*3/uL — ABNORMAL LOW (ref 150.0–400.0)
Sodium: 140 meq/L (ref 135–145)
Total Bilirubin: 0.7 mg/dL (ref 0.3–1.2)
Total CHOL/HDL Ratio: 5
VLDL: 24.4 mg/dL (ref 0.0–40.0)
WBC: 5.2 10*3/uL (ref 4.5–10.5)

## 2010-01-14 ENCOUNTER — Ambulatory Visit: Payer: Self-pay | Admitting: Internal Medicine

## 2010-02-11 ENCOUNTER — Ambulatory Visit
Admission: RE | Admit: 2010-02-11 | Discharge: 2010-02-11 | Payer: Self-pay | Source: Home / Self Care | Attending: Internal Medicine | Admitting: Internal Medicine

## 2010-02-11 LAB — CONVERTED CEMR LAB: Prothrombin Time: 18 s

## 2010-02-18 ENCOUNTER — Ambulatory Visit
Admission: RE | Admit: 2010-02-18 | Discharge: 2010-02-18 | Payer: Self-pay | Source: Home / Self Care | Attending: Internal Medicine | Admitting: Internal Medicine

## 2010-02-18 ENCOUNTER — Encounter: Payer: Self-pay | Admitting: Internal Medicine

## 2010-02-18 DIAGNOSIS — R079 Chest pain, unspecified: Secondary | ICD-10-CM | POA: Insufficient documentation

## 2010-02-18 LAB — CONVERTED CEMR LAB
INR: 3
Prothrombin Time: 36.1 s

## 2010-02-27 LAB — CONVERTED CEMR LAB
INR: 1.6
Prothrombin Time: 15.7 s

## 2010-03-03 ENCOUNTER — Ambulatory Visit (INDEPENDENT_AMBULATORY_CARE_PROVIDER_SITE_OTHER): Payer: PRIVATE HEALTH INSURANCE | Admitting: Family Medicine

## 2010-03-03 ENCOUNTER — Encounter: Payer: Self-pay | Admitting: Family Medicine

## 2010-03-03 DIAGNOSIS — J019 Acute sinusitis, unspecified: Secondary | ICD-10-CM

## 2010-03-03 NOTE — Letter (Signed)
Summary: Appointment - Missed  [facility], Main Office  [address]   [place], [place] [address]   Phone: [phone]  Fax: [phone]     December 20, 2009 MRN: [id number]   GAITHER BIEHN [address] [place], [place]  [address]   Dear Mr. GILKISON,  Our records indicate you missed your appointment in October with Dr. Clifton James.                                    It is very important that we reach you to reschedule this appointment. We look forward to participating in your health care needs. Please contact us at the number listed above at your earliest convenience to reschedule this appointment.     Sincerely, Neurosurgeon Team LG

## 2010-03-03 NOTE — Consult Note (Signed)
Summary: [facility]   Imported By: Lanelle Bal 06/11/2009 11:59:18  _____________________________________________________________________  External Attachment:    Type:   Image     Comment:   External Document  Appended Document: [facility] CKD grade 3 which is fairly stable variations with fluid status which seems stable on lower dose of furosemide

## 2010-03-03 NOTE — Assessment & Plan Note (Signed)
Summary: ROA  CYD   Vital Signs:  Patient profile:   77 year old male Height:      70 inches Weight:      231.0 pounds BMI:     33.26 Temp:     97.7 degrees F oral Pulse rate:   60 / minute Pulse rhythm:   regular BP sitting:   130 / 70  (left arm) Cuff size:   large  Vitals Entered By: Benny Lennert CMA Duncan Dull) (October 20, 2009 12:00 PM)  History of Present Illness: Chief complaint return office visit  77 year old male with R shoulder pain f/u: CKD stage IV, CAD, s/p CABG. DM  R shoulder.  pleasant gentleman well-known to me with a complex medical history, who is here in f/u of right-sided shoulder pain, probable rtc tear with a component of adhesive capsulitis. He does have some dull aching pain that goes down his arms as well.  He doesn't have any  known incident of trauma.  I did see him about 6 months ago, and when he was having  significant frozen shoulder as well,  injected his shoulder, and he actually got quite a bit better from then. He went one physical therapy session, however  now he has  diminished range of motion and pain  involving multiple planes of motion and additionally minimal function in abduction.  He is a diabetic  and is on Coumadin.  Allergies: 1)  * Sulfa (Sulfonamides) Group 2)  Buspar 3)  Augmentin  Past History:  Past medical, surgical, family and social histories (including risk factors) reviewed, and no changes noted (except as noted below).  Past Medical History: Reviewed history from 11/02/2008 and no changes required. Allergic rhinitis Anxiety Coronary artery disease---------------------------------------Dr B  Brodie Diabetes mellitus, type II GERD Hypertension Osteoarthritis Duodenal polyp 12/09-------------------------------------------Dr D Brodie Hyperlipidemia AF S/P A flutter ablation Diastolic CHF       Benign prostatic hypertrophy----------------------------------Dr Cope  Past Surgical History: Reviewed history from  05/29/2006 and no changes required. Multiple angioplasties and stents 6/01 Atrial flutter 1/02  CABG 2/02  Atrial tach/chest pain 7/05 RCA stent 12/07 circumcsion  Family History: Reviewed history from 05/29/2006 and no changes required. Mom died @59   DM and ESRD Dad died @67   CVA Sister with CAD, HTN, CAD  Social History: Reviewed history from 11/16/2008 and no changes required. Retired--disabled due to CAD Married--4 children Never Smoked Alcohol use-no--quit 30 years ago No ilicit drugs  Review of Systems       REVIEW OF SYSTEMS  GEN: No systemic complaints, no fevers, chills, sweats, or other acute illnesses MSK: Detailed in the HPI GI: tolerating PO intake without difficulty Neuro: as above Otherwise the pertinent positives of the ROS are noted above.    Physical Exam  General:  GEN: Well-developed,well-nourished,in no acute distress; alert,appropriate and cooperative throughout examination HEENT: Normocephalic and atraumatic without obvious abnormalities. No apparent alopecia or balding. Ears, externally no deformities PULM: Breathing comfortably in no respiratory distress EXT: No clubbing, cyanosis, or edema PSYCH: Normally interactive. Cooperative during the interview. Pleasant. Friendly and conversant. Not anxious or depressed appearing. Normal, full affect.  Msk:  R shoulder: loss of 30 degrees of abduction, loss of EROM approx 25 degrees, with minimal IROM at abduction  severely altered mechanics on R  TTP laterally and at supraspinatus insertion TTP bicipital groove + Neer, hawkins Neg Yergasons and Speeds  + AC crossover  elevation of scapula with abduction, pain with drop test but not true positive  Impression & Recommendations:  Problem # 1:  ROTATOR CUFF TEAR (ICD-727.61) suspect full vs. high grade partial supraspinatus tear.  GFR 20, CKD IV, CAD, s/p CABG, DM, on coumadin -- discussed with the patient and I feel that operative  intervention in this case would pose greater risk and potential death, reasonable return to shoulder function can be expected even if full thickness tear.   Problem # 2:  ADHESIVE CAPSULITIS, RIGHT (ICD-726.0)  frozen shoulder in combination with relatively significant  a.c. and glenohumeral joint arthritis.  Suspected least a high-grade partial tear in the past the supraspinatus.  Patient has many medical problems, and really conservative approach is most appropriate in this case.  I think that physical therapy to maximize his range of motion and functional integrity of the shoulder will likely help as much as anything - but he cannot afford co-pay. I reviewed with the patient a handout from [employer] and [facility] regarding their condition and a set of home exercises. They indicated that they understood what was discussed with them. All questions answered.   Intrarticular Shoulder Injection Verbal consent was obtained from the patient. Risks, benefits, and alternatives were explained. Patient prepped with Betadine and Ethyl Chloride used for anesthesia. An intraarticular shoulder injection was performed using the posterior approach. The patient tolerated the procedure well and had decreased pain post injection. No complications. Injection: 9 cc of Lidocaine 1% and 1cc of Kenalog 40 mg. Needle: 22 gauge   Orders: Physical Therapy Referral (PT)  Orders: Joint Aspirate / Injection, Large (20610) Kenalog 10mg  (4units) (J3301)  Problem # 3:  ARTHRITIS, RIGHT SHOULDER (ICD-716.91)  Xrays: Shoulder series Indication: shoulder pain Findings: No evidence of occult fracture AC joint: severe OA changes, large osteophytes Impingement pathology: Type 2 acromium  Orders: Joint Aspirate / Injection, Large (20610) Kenalog 10mg  (4units) (J3301)  Complete Medication List: 1)  Metoprolol Tartrate 100 Mg Tabs (Metoprolol tartrate) .... Take one tablet by mouth twice a day 2)   Glipizide 10 Mg Tb24 (Glipizide) .... Take 1 tablet by mouth once a day 3)  Potassium Chloride Crys Cr 20 Meq Tbcr (Potassium chloride crys cr) .... Take 1 tablet by mouth two times a day 4)  Warfarin Sodium 5 Mg Tabs (Warfarin sodium) .... Take one by mouth as directed 5)  Isosorbide Mononitrate Cr 30 Mg Xr24h-tab (Isosorbide mononitrate) .... Take one tablet once daily 6)  Plavix 75 Mg Tabs (Clopidogrel bisulfate) .... Take one by mouth daily 7)  Furosemide 80 Mg Tabs (Furosemide) .... Take one tablet two times a day 8)  Amiodarone Hcl 200 Mg Tabs (Amiodarone hcl) .... Take one by mouth daily 9)  Lisinopril 20 Mg Tabs (Lisinopril) .... Take one tablet by mouth daily 10)  Multivitamins Tabs (Multiple vitamin) .... Take one by mouth daily  (caused chest pain) pt stopped 11)  Aspirin 81 Mg Tabs (Aspirin) .... Take one by mouth daily 12)  Nitrostat 0.4 Mg Subl (Nitroglycerin) .... Use sl prn 13)  Tramadol Hcl 50 Mg Tabs (Tramadol hcl) .... Take 1/2-1  by mouth two times a day as needed 14)  Fluticasone Propionate 50 Mcg/act Susp (Fluticasone propionate) .... 2 sprays each nostril daily as needed 15)  Tylenol Extra Strength 500 Mg Tabs (Acetaminophen) .... As needed 16)  Prilosec 20 Mg Cpdr (Omeprazole) .... Take 1 tablet by mouth once a day 17)  Pravastatin Sodium 40 Mg Tabs (Pravastatin sodium) .... Take 1 tablet by mouth once daily 18)  [employer] Strp (  Glucose blood) .... Check blood sugar three times a day 19)  Flomax 0.4 Mg Xr24h-cap (Tamsulosin hcl) .Marland Kitchen.. 1 tab daily 20)  Amlodipine Besylate 10 Mg Tabs (Amlodipine besylate) .... Take one tablet by mouth daily  Current Allergies (reviewed today): * SULFA (SULFONAMIDES) GROUP BUSPAR AUGMENTIN  Appended Document: ROA  CYD correction: 20 mg kenalog used in shoulder injection given DM.

## 2010-03-03 NOTE — Miscellaneous (Signed)
Summary: Orders Update  Clinical Lists Changes  Orders: Added new Test order of Arterial Duplex Lower Extremity (Arterial Duplex Low) - Signed 

## 2010-03-03 NOTE — Letter (Signed)
Summary: [facility]  [facility]   Imported By: Lanelle Bal 03/31/2009 11:30:32  _____________________________________________________________________  External Attachment:    Type:   Image     Comment:   External Document

## 2010-03-03 NOTE — Assessment & Plan Note (Signed)
Summary: 9:15  shoulder pain   Vital Signs:  Patient profile:   77 year old male Height:      70 inches Weight:      229.4 pounds BMI:     33.03 Temp:     97.8 degrees F oral Pulse rate:   60 / minute Pulse rhythm:   regular BP sitting:   130 / 72  (left arm) Cuff size:   large  Vitals Entered By: Benny Lennert CMA Duncan Dull) (March 01, 2009 3:03 PM)  History of Present Illness: Chief complaint shoulder pain in both shoulders  77 year old with CKD, CAD, DM  B shoulder pain: R > L  For about a year or two has been bothering him a lot.   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 The patient denies neck pain or radicular symptoms. Denies dislocation, subluxation, separation of the shoulder. The patient does complain of pain in the overhead plane and has limitation of motion  Medications Tried: unable since on coumadin, amiodarone Tried PT: few visits to [facility] on [address]  Prior shoulder Injury: No Prior surgery: No Prior fracture: No     R > L   Allergies: 1)  * Sulfa (Sulfonamides) Group 2)  Buspar 3)  Augmentin  Past History:  Past medical, surgical, family and social histories (including risk factors) reviewed, and no changes noted (except as noted below).  Past Medical History: Reviewed history from 11/02/2008 and no changes required. Allergic rhinitis Anxiety Coronary artery disease---------------------------------------Dr B  Brodie Diabetes mellitus, type II GERD Hypertension Osteoarthritis Duodenal polyp 12/09-------------------------------------------Dr D Brodie Hyperlipidemia AF S/P A flutter ablation Diastolic CHF       Benign prostatic hypertrophy----------------------------------Dr Cope  Past Surgical History: Reviewed history from 05/29/2006 and no changes required. Multiple angioplasties and stents 6/01 Atrial flutter 1/02  CABG 2/02  Atrial  tach/chest pain 7/05 RCA stent 12/07 circumcsion  Family History: Reviewed history from 05/29/2006 and no changes required. Mom died @59   DM and ESRD Dad died @67   CVA Sister with CAD, HTN, CAD  Social History: Reviewed history from 11/16/2008 and no changes required. Retired--disabled due to CAD Married--4 children Never Smoked Alcohol use-no--quit 30 years ago No ilicit drugs  Review of Systems       REVIEW OF SYSTEMS  GEN: No systemic complaints, no fevers, chills, sweats, or other acute illnesses MSK: Detailed in the HPI GI: tolerating PO intake without difficulty Neuro: No numbness, parasthesias, or tingling associated. Otherwise the pertinent positives of the ROS are noted above.    Physical Exam  General:  alert.  NADwell-developed.   Head:  normocephalic and atraumatic.   Ears:  no external deformities.   Nose:  no external deformity.   Chest Wall:  large post-CABG scar Lungs:  normal respiratory effort.   Msk:  L shoulder: Loss of terminal 20 degrees or abd, loss of 15 degrees EROM, minimal IROM in abduction  R shoulder: loss of 40 degrees of abduction, loss of EROM approx 25 degrees, with minimal IROM at abduction  severely altered mechanics on R  TTP laterally and at supraspinatus insertion TTP bicipital groove + Neer, hawkins Neg Yergasons and Speeds  + AC crossover  elevation of scapula with abduction, pain with drop test but not true positive   Impression & Recommendations:  Problem # 1:  TENDINITIS, SHOULDER (ICD-727.00) R > L  Suspect multiple  MSK issues, Glenohumeral and AC arthritis, tendinopathy, and a component of loss of motion and adhesion  I suspect that he will never be 100% and anything that can make him more comfortable is reasonable. Multiple medical comorbidities, and I do not think he is a surgical candidate.  It is possible that he has an old degenerative cuff tear on R supraspinatus, but this should not be pursued  aggressively.  I am going to go to shoulder rehab at [facility] PT department, who I feel does a better job with complex shoulder cases in [place].  SubAC Injection Verbal consent was obtained from the patient. Risks, benefits, and alternatives were explained. Patient prepped with Betadine and Ethyl Chloride used for anesthesia. The subacromial space was injected using the posterior approach. The patient tolerated the procedure well and had decreased pain post injection. No complications. Injection: 9 cc of Marcaine 0.5% and 1cc of Kenalog 40 mg. Needle: 22 gauge   Orders: Physical Therapy Referral (PT) Joint Aspirate / Injection, Large (20610) Kenalog 10mg  (4units) (J3301)  Problem # 2:  ADHESIVE CAPSULITIS, RIGHT (ICD-726.0)  Orders: Physical Therapy Referral (PT) Joint Aspirate / Injection, Large (20610) Kenalog 10mg  (4units) (J3301)  Problem # 3:  ARTHRITIS, RIGHT SHOULDER (ICD-716.91)  Orders: Physical Therapy Referral (PT) Joint Aspirate / Injection, Large (20610) Kenalog 10mg  (4units) (J3301)  Complete Medication List: 1)  Metoprolol Tartrate 100 Mg Tabs (Metoprolol tartrate) .... Take one tablet by mouth twice a day 2)  Glipizide 10 Mg Tb24 (Glipizide) .... Take 1 tablet by mouth once a day 3)  Potassium Chloride Crys Cr 20 Meq Tbcr (Potassium chloride crys cr) .... Take 1 tablet by mouth two times a day 4)  Warfarin Sodium 5 Mg Tabs (Warfarin sodium) .... Take one by mouth as directed 5)  Isosorbide Dinitrate 30 Mg Tabs (Isosorbide dinitrate) .... Take one by mouth daily 6)  Plavix 75 Mg Tabs (Clopidogrel bisulfate) .... Take one by mouth daily 7)  Furosemide 80 Mg Tabs (Furosemide) .... Take one tablet two times a day 8)  Amiodarone Hcl 200 Mg Tabs (Amiodarone hcl) .... Take one by mouth daily 9)  Lisinopril 20 Mg Tabs (Lisinopril) .... Take one tablet by mouth daily 10)  Multivitamins Tabs (Multiple vitamin) .... Take one by mouth daily  (caused chest pain)  pt stopped 11)  Aspirin 81 Mg Tabs (Aspirin) .... Take one by mouth daily 12)  Nitrostat 0.4 Mg Subl (Nitroglycerin) .... Use sl prn 13)  Tramadol Hcl 50 Mg Tabs (Tramadol hcl) .... Take 1/2-1  by mouth two times a day as needed 14)  Fluticasone Propionate 50 Mcg/act Susp (Fluticasone propionate) .... 2 sprays each nostril daily as needed 15)  Tylenol Extra Strength 500 Mg Tabs (Acetaminophen) .... As needed 16)  Prilosec 20 Mg Cpdr (Omeprazole) .... Take 1 tablet by mouth once a day 17)  Pravastatin Sodium 40 Mg Tabs (Pravastatin sodium) .... Take 1 tablet by mouth once daily 18)  [employer]ayer Contour Test Strp (Glucose blood) .... Check blood sugar three times a day 19)  Flomax 0.4 Mg Xr24h-cap (Tamsulosin hcl) .Marland Kitchen.. 1 tab daily 20)  Amlodipine Besylate 10 Mg Tabs (Amlodipine besylate) .... Take one tablet by mouth daily  Patient Instructions: 1)  Referral Appointment Information 2)  Day/Date: 3)  Time: 4)  Place/MD: 5)  Address: 6)  Phone/Fax: 7)  Patient given appointment information. Information/Orders faxed/mailed.  8)  follow-up 6 weeks  Current Allergies (reviewed today): * SULFA (SULFONAMIDES) GROUP BUSPAR AUGMENTIN

## 2010-03-03 NOTE — Assessment & Plan Note (Signed)
Summary: RIGHT ARM PAIN/CLE   Vital Signs:  Patient profile:   77 year old male Height:      70 inches Weight:      232.8 pounds BMI:     33.52 Temp:     97.5 degrees F oral Pulse rate:   60 / minute Pulse rhythm:   regular BP sitting:   120 / 70  (left arm) Cuff size:   large  Vitals Entered By: Benny Lennert CMA Duncan Dull) (September 16, 2009 11:32 AM)  History of Present Illness: Chief complaint Right arm pain  77 year old male:  Right now having right shoulder and hand pain.   pleasant gentleman well-known to me with a complex medical history, who complains today of right-sided shoulder pain and arm pain. He does have some dull aching pain that goes down his arms as well.  He doesn't have any  known incident of trauma.  I did see him about 6 months ago, and when he was having  significant frozen shoulder as well,  injected his shoulder, and he actually got quite a bit better from then. He went one physical therapy session, however  now he has  diminished range of motion and pain  involving multiple planes of motion.  He is a diabetic  and is on Coumadin.   Some occasional burning and tingling in his hand. no n/v/d  Allergies: 1)  * Sulfa (Sulfonamides) Group 2)  Buspar 3)  Augmentin  Past History:  Past medical, surgical, family and social histories (including risk factors) reviewed, and no changes noted (except as noted below).  Past Medical History: Reviewed history from 11/02/2008 and no changes required. Allergic rhinitis Anxiety Coronary artery disease---------------------------------------Dr B  Brodie Diabetes mellitus, type II GERD Hypertension Osteoarthritis Duodenal polyp 12/09-------------------------------------------Dr D Brodie Hyperlipidemia AF S/P A flutter ablation Diastolic CHF       Benign prostatic hypertrophy----------------------------------Dr Cope  Past Surgical History: Reviewed history from 05/29/2006 and no changes required. Multiple  angioplasties and stents 6/01 Atrial flutter 1/02  CABG 2/02  Atrial tach/chest pain 7/05 RCA stent 12/07 circumcsion  Family History: Reviewed history from 05/29/2006 and no changes required. Mom died @59   DM and ESRD Dad died @67   CVA Sister with CAD, HTN, CAD  Social History: Reviewed history from 11/16/2008 and no changes required. Retired--disabled due to CAD Married--4 children Never Smoked Alcohol use-no--quit 30 years ago No ilicit drugs  Physical Exam  General:  GEN: Well-developed,well-nourished,in no acute distress; alert,appropriate and cooperative throughout examination HEENT: Normocephalic and atraumatic without obvious abnormalities. No apparent alopecia or balding. Ears, externally no deformities PULM: Breathing comfortably in no respiratory distress EXT: No clubbing, cyanosis, or edema PSYCH: Normally interactive. Cooperative during the interview. Pleasant. Friendly and conversant. Not anxious or depressed appearing. Normal, full affect.  Msk:   L shoulder: Loss of terminal 20 degrees or abd, loss of 15 degrees EROM, minimal IROM in abduction  R shoulder: loss of 30 degrees of abduction, loss of EROM approx 25 degrees, with minimal IROM at abduction  severely altered mechanics on R  TTP laterally and at supraspinatus insertion TTP bicipital groove + Neer, hawkins Neg Yergasons and Speeds  + AC crossover  elevation of scapula with abduction, pain with drop test but not true positive   Impression & Recommendations:  Problem # 1:  ADHESIVE CAPSULITIS, RIGHT (ICD-726.0) frozen shoulder in combination with relatively significant  a.c. and glenohumeral joint arthritis.  Suspected least a high-grade partial tear in the past the supraspinatus.  Patient has many medical problems, and really conservative approach is most appropriate in this case.  I think that physical therapy to maximize his range of motion and functional integrity of the shoulder will  likely help as much as anything. I do not think it's realistic that he will be ultimately completely asymptomatic.  Doing things that make him functional for possible including an occasional interarticular  shoulder injection would be reasonable.  Orders: Physical Therapy Referral (PT)  Problem # 2:  ARTHRITIS, RIGHT SHOULDER (ICD-716.91) RIGHT shoulder x-ray series: There is evidence of significant acromioclavicular arts yard changes as well as glenohumeral arthritic changes.  Orders: T-Shoulder Right (73030TC) Physical Therapy Referral (PT)  Complete Medication List: 1)  Metoprolol Tartrate 100 Mg Tabs (Metoprolol tartrate) .... Take one tablet by mouth twice a day 2)  Glipizide 10 Mg Tb24 (Glipizide) .... Take 1 tablet by mouth once a day 3)  Potassium Chloride Crys Cr 20 Meq Tbcr (Potassium chloride crys cr) .... Take 1 tablet by mouth two times a day 4)  Warfarin Sodium 5 Mg Tabs (Warfarin sodium) .... Take one by mouth as directed 5)  Isosorbide Dinitrate 30 Mg Tabs (Isosorbide dinitrate) .... Take one by mouth daily 6)  Plavix 75 Mg Tabs (Clopidogrel bisulfate) .... Take one by mouth daily 7)  Furosemide 80 Mg Tabs (Furosemide) .... Take one tablet two times a day 8)  Amiodarone Hcl 200 Mg Tabs (Amiodarone hcl) .... Take one by mouth daily 9)  Lisinopril 20 Mg Tabs (Lisinopril) .... Take one tablet by mouth daily 10)  Multivitamins Tabs (Multiple vitamin) .... Take one by mouth daily  (caused chest pain) pt stopped 11)  Aspirin 81 Mg Tabs (Aspirin) .... Take one by mouth daily 12)  Nitrostat 0.4 Mg Subl (Nitroglycerin) .... Use sl prn 13)  Tramadol Hcl 50 Mg Tabs (Tramadol hcl) .... Take 1/2-1  by mouth two times a day as needed 14)  Fluticasone Propionate 50 Mcg/act Susp (Fluticasone propionate) .... 2 sprays each nostril daily as needed 15)  Tylenol Extra Strength 500 Mg Tabs (Acetaminophen) .... As needed 16)  Prilosec 20 Mg Cpdr (Omeprazole) .... Take 1 tablet by mouth once a  day 17)  Pravastatin Sodium 40 Mg Tabs (Pravastatin sodium) .... Take 1 tablet by mouth once daily 18)  Bayer Contour Test Strp (Glucose blood) .... Check blood sugar three times a day 19)  Flomax 0.4 Mg Xr24h-cap (Tamsulosin hcl) .Marland Kitchen.. 1 tab daily 20)  Amlodipine Besylate 10 Mg Tabs (Amlodipine besylate) .... Take one tablet by mouth daily  Patient Instructions: 1)  CAPZAICIN CREAM --- PUT ON WITH A SANDWICH BAG OR RUBBER GLOVE, THEN WASH HANDS 2)  ONLY USE AS NEEDED 3)  Referral Appointment Information 4)  Day/Date: 5)  Time: 6)  Place/MD: 7)  Address: 8)  Phone/Fax: 9)  Patient given appointment information. Information/Orders faxed/mailed.   Current Allergies (reviewed today): * SULFA (SULFONAMIDES) GROUP BUSPAR AUGMENTIN

## 2010-03-03 NOTE — Progress Notes (Signed)
----   Converted from flag ---- ---- 10/08/2009 10:45 AM, Mills Koller wrote: Patient notified and scheduled for f/u appt with Dr Patsy Lager.  ---- 10/08/2009 9:52 AM, Hannah Beat MD wrote: Yes - let Daryll know there is a fair amount of arthritis and bone spurs - i am sorry if he never got contacted  ---- 10/08/2009 9:27 AM, Kerby Nora MD wrote: Gavin Potters pt thtat there sre atble arthritis changes and no fractures seen. i will forward to Dr. Patsy Lager for any further comments from him.   ---- 10/08/2009 9:02 AM, Mills Koller wrote: Pt never contacted about his xray results,Terri ------------------------------

## 2010-03-03 NOTE — Assessment & Plan Note (Signed)
Summary: leg pain/alc   Vital Signs:  Patient profile:   77 year old male Height:      70 inches Weight:      231 pounds BMI:     33.26 Temp:     98.0 degrees F oral Pulse rate:   68 / minute Pulse rhythm:   regular BP sitting:   118 / 62  (left arm) Cuff size:   large  Vitals Entered By: Linde Gillis CMA Duncan Dull) (December 31, 2009 8:42 AM) CC: left leg pain   History of Present Illness: Having pain in left low back runs down into the leg makes it hard to walk started about 2 weeks ago No apparent injury tried some exercise---bending and stretching Tylenol not really helping  Having neck pain Right shoulder still hurts --despite injection a couple of months ago  Has been having some sharp left chest pain Mostly when walking Goes away quickly with stopping and rubbing area (upper chest below shoulder)  Hasn't been checking sugars in a while Only glipizide no insulin of late no low sugar reactions  Allergies: 1)  * Sulfa (Sulfonamides) Group 2)  Buspar 3)  Augmentin  Past History:  Past medical, surgical, family and social histories (including risk factors) reviewed for relevance to current acute and chronic problems.  Past Medical History: Reviewed history from 11/02/2008 and no changes required. Allergic rhinitis Anxiety Coronary artery disease---------------------------------------Dr B  Brodie Diabetes mellitus, type II GERD Hypertension Osteoarthritis Duodenal polyp 12/09-------------------------------------------Dr D Brodie Hyperlipidemia AF S/P A flutter ablation Diastolic CHF       Benign prostatic hypertrophy----------------------------------Dr Cope  Past Surgical History: Reviewed history from 05/29/2006 and no changes required. Multiple angioplasties and stents 6/01 Atrial flutter 1/02  CABG 2/02  Atrial tach/chest pain 7/05 RCA stent 12/07 circumcsion  Family History: Reviewed history from 05/29/2006 and no changes required. Mom died  @59   DM and ESRD Dad died @67   CVA Sister with CAD, HTN, CAD  Social History: Reviewed history from 11/16/2008 and no changes required. Retired--disabled due to CAD Married--4 children Never Smoked Alcohol use-no--quit 30 years ago No ilicit drugs  Review of Systems       appetite is fine weight stable still sleeps okay in general---does sleep in chair often though  Physical Exam  General:  alert.  NAD Neck:  supple, no masses, and no thyromegaly.   Lungs:  normal respiratory effort, no intercostal retractions, no accessory muscle use, and normal breath sounds.   Heart:  normal rate, regular rhythm, no murmur, and no gallop.   Msk:  mild lumbar back tenderness -- L>R Slight spine tenderness--nothing focal SLR negative Fair ROM in hips Neurologic:  Gait is slow but not ataxic no focal weakness   Impression & Recommendations:  Problem # 1:  BACK PAIN (ICD-724.5) Assessment Comment Only seems to have strain  discussed heat and local care refill for tramadol  His updated medication list for this problem includes:    Aspirin 81 Mg Tabs (Aspirin) .Marland Kitchen... Take one by mouth daily    Tramadol Hcl 50 Mg Tabs (Tramadol hcl) .Marland Kitchen... Take 1/2-1  by mouth three  times a day as needed    Tylenol Extra Strength 500 Mg Tabs (Acetaminophen) .Marland Kitchen... As needed  Problem # 2:  DIABETES MELLITUS, TYPE II (ICD-250.00) Assessment: Comment Only  overdue for labs seems to be okay  His updated medication list for this problem includes:    Glipizide 10 Mg Tb24 (Glipizide) .Marland Kitchen... Take 1 tablet by mouth  once a day    Lisinopril 20 Mg Tabs (Lisinopril) .Marland Kitchen... Take one tablet by mouth daily    Aspirin 81 Mg Tabs (Aspirin) .Marland Kitchen... Take one by mouth daily  Labs Reviewed: Creat: 2.9 (11/02/2008)     Last Eye Exam: normal (05/01/2007) Reviewed HgBA1c results: 7.7 (11/02/2008)  8.5 (06/12/2008)  Orders: TLB-A1C / Hgb A1C (Glycohemoglobin) (83036-A1C)  Problem # 3:  CORONARY ARTERY DISEASE S/P MI X  2 (ICD-414.00) Assessment: Comment Only  has some chest pain but doesn't seem anginal  His updated medication list for this problem includes:    Metoprolol Tartrate 100 Mg Tabs (Metoprolol tartrate) .Marland Kitchen... Take one tablet by mouth twice a day    Isosorbide Mononitrate Cr 30 Mg Xr24h-tab (Isosorbide mononitrate) .Marland Kitchen... Take one tablet once daily    Plavix 75 Mg Tabs (Clopidogrel bisulfate) .Marland Kitchen... Take one by mouth daily    Furosemide 80 Mg Tabs (Furosemide) .Marland Kitchen... Take one tablet two times a day    Amiodarone Hcl 200 Mg Tabs (Amiodarone hcl) .Marland Kitchen... Take one by mouth daily    Lisinopril 20 Mg Tabs (Lisinopril) .Marland Kitchen... Take one tablet by mouth daily    Aspirin 81 Mg Tabs (Aspirin) .Marland Kitchen... Take one by mouth daily    Nitrostat 0.4 Mg Subl (Nitroglycerin) ..... Use sl prn    Amlodipine Besylate 10 Mg Tabs (Amlodipine besylate) .Marland Kitchen... Take one tablet by mouth daily  Orders: Venipuncture (16109) TLB-Renal Function Panel (80069-RENAL) TLB-CBC Platelet - w/Differential (85025-CBCD) TLB-Hepatic/Liver Function Pnl (80076-HEPATIC) TLB-TSH (Thyroid Stimulating Hormone) (84443-TSH)  Problem # 4:  ATRIAL FIBRILLATION (ICD-427.31)    seems regular now after a flutter ablation on coumadin  His updated medication list for this problem includes:    Metoprolol Tartrate 100 Mg Tabs (Metoprolol tartrate) .Marland Kitchen... Take one tablet by mouth twice a day    Warfarin Sodium 5 Mg Tabs (Warfarin sodium) .Marland Kitchen... Take one by mouth as directed    Plavix 75 Mg Tabs (Clopidogrel bisulfate) .Marland Kitchen... Take one by mouth daily    Amiodarone Hcl 200 Mg Tabs (Amiodarone hcl) .Marland Kitchen... Take one by mouth daily    Aspirin 81 Mg Tabs (Aspirin) .Marland Kitchen... Take one by mouth daily    Amlodipine Besylate 10 Mg Tabs (Amlodipine besylate) .Marland Kitchen... Take one tablet by mouth daily  Complete Medication List: 1)  Metoprolol Tartrate 100 Mg Tabs (Metoprolol tartrate) .... Take one tablet by mouth twice a day 2)  Glipizide 10 Mg Tb24 (Glipizide) .... Take 1 tablet  by mouth once a day 3)  Potassium Chloride Crys Cr 20 Meq Tbcr (Potassium chloride crys cr) .... Take 1 tablet by mouth two times a day 4)  Warfarin Sodium 5 Mg Tabs (Warfarin sodium) .... Take one by mouth as directed 5)  Isosorbide Mononitrate Cr 30 Mg Xr24h-tab (Isosorbide mononitrate) .... Take one tablet once daily 6)  Plavix 75 Mg Tabs (Clopidogrel bisulfate) .... Take one by mouth daily 7)  Furosemide 80 Mg Tabs (Furosemide) .... Take one tablet two times a day 8)  Amiodarone Hcl 200 Mg Tabs (Amiodarone hcl) .... Take one by mouth daily 9)  Lisinopril 20 Mg Tabs (Lisinopril) .... Take one tablet by mouth daily 10)  Multivitamins Tabs (Multiple vitamin) .... Take one by mouth daily  (caused chest pain) pt stopped 11)  Aspirin 81 Mg Tabs (Aspirin) .... Take one by mouth daily 12)  Nitrostat 0.4 Mg Subl (Nitroglycerin) .... Use sl prn 13)  Tramadol Hcl 50 Mg Tabs (Tramadol hcl) .... Take 1/2-1  by mouth three  times  a day as needed 14)  Fluticasone Propionate 50 Mcg/act Susp (Fluticasone propionate) .... 2 sprays each nostril daily as needed 15)  Tylenol Extra Strength 500 Mg Tabs (Acetaminophen) .... As needed 16)  Prilosec 20 Mg Cpdr (Omeprazole) .... Take 1 tablet by mouth once a day 17)  Pravastatin Sodium 40 Mg Tabs (Pravastatin sodium) .... Take 1 tablet by mouth once daily 18)  Bayer Contour Test Strp (Glucose blood) .... Check blood sugar three times a day 19)  Flomax 0.4 Mg Xr24h-cap (Tamsulosin hcl) .Marland Kitchen.. 1 tab daily 20)  Amlodipine Besylate 10 Mg Tabs (Amlodipine besylate) .... Take one tablet by mouth daily  Other Orders: TLB-Lipid Panel (80061-LIPID)  Patient Instructions: 1)  Please schedule a follow-up appointment in 4 months .  Prescriptions: TRAMADOL HCL 50 MG TABS (TRAMADOL HCL) Take 1/2-1  by mouth three  times a day as needed  #90 x 0   Entered and Authorized by:   Cindee Salt MD   Signed by:   Cindee Salt MD on 12/31/2009   Method used:    Electronically to        [facility]  [address] [phone].* (retail)       [address]       [place], [place]  [address]       Ph: [phone] or [phone]       Fax: [phone]   RxID:   [id number]    Orders Added: 1)  Est. Patient Level IV [16606] 2)  TLB-A1C / Hgb A1C (Glycohemoglobin) [83036-A1C] 3)  Venipuncture [30160] 4)  TLB-Renal Function Panel [80069-RENAL] 5)  TLB-CBC Platelet - w/Differential [85025-CBCD] 6)  TLB-Hepatic/Liver Function Pnl [80076-HEPATIC] 7)  TLB-TSH (Thyroid Stimulating Hormone) [84443-TSH] 8)  TLB-Lipid Panel [80061-LIPID]    Current Allergies (reviewed today): * SULFA (SULFONAMIDES) GROUP BUSPAR AUGMENTIN

## 2010-03-03 NOTE — Assessment & Plan Note (Signed)
Summary: COLD/CLE   Vital Signs:  Patient profile:   77 year old male Weight:      229.75 pounds Temp:     98.1 degrees F oral Pulse rate:   60 / minute Pulse rhythm:   regular BP sitting:   130 / 60  (left arm) Cuff size:   large  Vitals Entered By: Selena Batten Dance CMA [facility] Dull) (October 08, 2009 8:24 AM) CC: Sinus pain and pressure and headache   History of Present Illness: CC: cough, HA, sinus pain  78yo diabetic, CAD s/p CABG 2002, HTN, HLD, afib on coumadin, amiodarone, dCHF, presents with 3wk h/o congestion, pressure headache, sinus pain, worse tooth pain on upper right teeth, ear pain, subjective fevers/chills, productive cough (white flegm).  flonase not helping.  + h/o sinus infection, feels same as currently.  no smokers at home.  No abd pain, n/v/d.  This am with sharp chest pain in shower.  Seemed to be brought on by coughing.  Not exertional.  Not reproducible, relieved by sitting down.  s/p CABG 1990s.  takes aspirin, coumadin.  last saw Dr. Juanda Chance 2 months ago.  in chart augmentin allergy, unspecified, however pt says he has tolerated penicillin in past.  Current Medications (verified): 1)  Metoprolol Tartrate 100 Mg Tabs (Metoprolol Tartrate) .... Take One Tablet By Mouth Twice A Day 2)  Glipizide 10 Mg Tb24 (Glipizide) .... Take 1 Tablet By Mouth Once A Day 3)  Potassium Chloride Crys Cr 20 Meq Tbcr (Potassium Chloride Crys Cr) .... Take 1 Tablet By Mouth Two Times A Day 4)  Warfarin Sodium 5 Mg Tabs (Warfarin Sodium) .... Take One By Mouth As Directed 5)  Isosorbide Dinitrate 30 Mg Tabs (Isosorbide Dinitrate) .... Take One By Mouth Daily 6)  Plavix 75 Mg Tabs (Clopidogrel Bisulfate) .... Take One By Mouth Daily 7)  Furosemide 80 Mg  Tabs (Furosemide) .... Take One Tablet Two Times A Day 8)  Amiodarone Hcl 200 Mg Tabs (Amiodarone Hcl) .... Take One By Mouth Daily 9)  Lisinopril 20 Mg Tabs (Lisinopril) .... Take One Tablet By Mouth Daily 10)  Multivitamins  Tabs  (Multiple Vitamin) .... Take One By Mouth Daily  (Caused Chest Pain) Pt Stopped 11)  Aspirin 81 Mg Tabs (Aspirin) .... Take One By Mouth Daily 12)  Nitrostat 0.4 Mg Subl (Nitroglycerin) .... Use Sl Prn 13)  Tramadol Hcl 50 Mg Tabs (Tramadol Hcl) .... Take 1/2-1  By Mouth Two Times A Day As Needed 14)  Fluticasone Propionate 50 Mcg/act  Susp (Fluticasone Propionate) .... 2 Sprays Each Nostril Daily As Needed 15)  Tylenol Extra Strength 500 Mg  Tabs (Acetaminophen) .... As Needed 16)  Prilosec 20 Mg  Cpdr (Omeprazole) .... Take 1 Tablet By Mouth Once A Day 17)  Pravastatin Sodium 40 Mg Tabs (Pravastatin Sodium) .... Take 1 Tablet By Mouth Once Daily 18)  Bayer Contour Test  Strp (Glucose Blood) .... Check Blood Sugar Three Times A Day 19)  Flomax 0.4 Mg Xr24h-Cap (Tamsulosin Hcl) .Marland Kitchen.. 1 Tab Daily 20)  Amlodipine Besylate 10 Mg Tabs (Amlodipine Besylate) .... Take One Tablet By Mouth Daily  Allergies: 1)  * Sulfa (Sulfonamides) Group 2)  Buspar 3)  Augmentin  Past History:  Past Medical History: Last updated: 11/02/2008 Allergic rhinitis Anxiety Coronary artery disease---------------------------------------Dr B  Brodie Diabetes mellitus, type II GERD Hypertension Osteoarthritis Duodenal polyp 12/09-------------------------------------------Dr D Brodie Hyperlipidemia AF S/P A flutter ablation Diastolic CHF       Benign prostatic hypertrophy----------------------------------Dr Achilles Dunk  Review of Systems       per HPI  Physical Exam  General:  Well-developed,well-nourished,in no acute distress; alert,appropriate and cooperative throughout examination Head:  + R max > frontal sinus tenderness Eyes:  No corneal or conjunctival inflammation noted. EOMI. Perrla.  Ears:  External ear exam shows no significant lesions or deformities.  Otoscopic examination reveals clear canals, tympanic membranes are intact bilaterally without bulging, retraction, inflammation or discharge. Hearing is  grossly normal bilaterally.  + cerumen bilaterally Nose:  crusted purulent discharge in nares, swollen turbinates Mouth:  no erythema and no exudates.   Neck:  supple, no masses, and no cervical lymphadenopathy.   Lungs:  Normal respiratory effort, chest expands symmetrically. Lungs are clear to auscultation, no crackles or wheezes. Heart:  normal rate, regular rhythm, no murmur, and no gallop.   Pulses:  2+ rad pulses Extremities:  no edema Skin:  no rashes.     Impression & Recommendations:  Problem # 1:  SINUSITIS - ACUTE-NOS (ICD-461.9) Instructed on treatment. Call if symptoms persist or worsen.  would have liked to treat pt with amox given pt says has tolerated in past, however in chart is allergy to augmentin.  will treat with zpack - pt has tolerated this in past as well.  discussed use of neti pot/nasal saline drops and PO fluids to help mobilize sinus congestion.  His updated medication list for this problem includes:    Fluticasone Propionate 50 Mcg/act Susp (Fluticasone propionate) .Marland Kitchen... 2 sprays each nostril daily as needed    Zithromax Z-pak 250 Mg Tabs (Azithromycin) .Marland Kitchen... Take as directed  Problem # 2:  CHEST PAIN (ICD-786.50) currently without chest pain.  doubt cardiac as assoc with cough and not typical characteristics of cardiac.  however pt with extensive CAD hx.  refilled nitro SL (pt out of) and advised if returns to seek urgent medical care.  Complete Medication List: 1)  Metoprolol Tartrate 100 Mg Tabs (Metoprolol tartrate) .... Take one tablet by mouth twice a day 2)  Glipizide 10 Mg Tb24 (Glipizide) .... Take 1 tablet by mouth once a day 3)  Potassium Chloride Crys Cr 20 Meq Tbcr (Potassium chloride crys cr) .... Take 1 tablet by mouth two times a day 4)  Warfarin Sodium 5 Mg Tabs (Warfarin sodium) .... Take one by mouth as directed 5)  Isosorbide Dinitrate 30 Mg Tabs (Isosorbide dinitrate) .... Take one by mouth daily 6)  Plavix 75 Mg Tabs (Clopidogrel  bisulfate) .... Take one by mouth daily 7)  Furosemide 80 Mg Tabs (Furosemide) .... Take one tablet two times a day 8)  Amiodarone Hcl 200 Mg Tabs (Amiodarone hcl) .... Take one by mouth daily 9)  Lisinopril 20 Mg Tabs (Lisinopril) .... Take one tablet by mouth daily 10)  Multivitamins Tabs (Multiple vitamin) .... Take one by mouth daily  (caused chest pain) pt stopped 11)  Aspirin 81 Mg Tabs (Aspirin) .... Take one by mouth daily 12)  Nitrostat 0.4 Mg Subl (Nitroglycerin) .... Use sl prn 13)  Tramadol Hcl 50 Mg Tabs (Tramadol hcl) .... Take 1/2-1  by mouth two times a day as needed 14)  Fluticasone Propionate 50 Mcg/act Susp (Fluticasone propionate) .... 2 sprays each nostril daily as needed 15)  Tylenol Extra Strength 500 Mg Tabs (Acetaminophen) .... As needed 16)  Prilosec 20 Mg Cpdr (Omeprazole) .... Take 1 tablet by mouth once a day 17)  Pravastatin Sodium 40 Mg Tabs (Pravastatin sodium) .... Take 1 tablet by mouth once daily 18)  Bayer Contour Test Strp (Glucose blood) .... Check blood sugar three times a day 19)  Flomax 0.4 Mg Xr24h-cap (Tamsulosin hcl) .Marland Kitchen.. 1 tab daily 20)  Amlodipine Besylate 10 Mg Tabs (Amlodipine besylate) .... Take one tablet by mouth daily 21)  Zithromax Z-pak 250 Mg Tabs (Azithromycin) .... Take as directed  Patient Instructions: 1)  You may have sinusitis. 2)  Treat with nasal saline drops or neti pot, ensure you drinking plenty of water 3)  Zpack today for sinusitis. 4)  Refilled sublingual nitro. 5)  If not better, or if fevers >101.5, or trouble swallowing or opening mouth, you may need to be seen again.   6)  If chest pain returns, you will need to be seen again. 7)  Plesaure to see you today.  call clinic with questions. Prescriptions: ZITHROMAX Z-PAK 250 MG TABS (AZITHROMYCIN) take as directed  #1 x 0   Entered and Authorized by:   Eustaquio Boyden  MD   Signed by:   Eustaquio Boyden  MD on 10/08/2009   Method used:   [facility] to        [facility]   [address] [phone].* (retail)       [address]       [place], [place]  [address]       Ph: [phone] or [phone]       Fax: [phone]   RxID:   [id number]   Current Allergies (reviewed today): * SULFA (SULFONAMIDES) GROUP BUSPAR AUGMENTIN

## 2010-03-03 NOTE — Consult Note (Signed)
Summary: Mady Haagensen, MD  Mady Haagensen, MD   Imported By: Lanelle Bal 02/23/2009 13:09:28  _____________________________________________________________________  External Attachment:    Type:   Image     Comment:   External Document  Appended Document: Munsoor Cherylann Ratel, MD CKD stage 3 Exacerbation better after cutting furosemide started iron

## 2010-03-03 NOTE — Assessment & Plan Note (Signed)
Summary: diabetes per dee/dlo   Vital Signs:  Patient profile:   76 year old male Weight:      230 pounds Temp:     98.0 degrees F oral Pulse rate:   54 / minute Pulse rhythm:   regular BP sitting:   137 / 49  (left arm) Cuff size:   large  Vitals Entered By: Mervin Hack CMA [facility] Dull) (February 18, 2010 8:59 AM) CC: diabetes/ chest pain x3days   History of Present Illness: Discussed his diabetic control Hasn't made major strides weight down 1#  Disucssed lantus vs [place] Money is definitely an issue was on insulin in past--comfortable with shots Fasting sugars still under 200  Having some chest pain on left "like pins sticking me" Breathing "has been off" Mostly DOE but occ just sitting around  Lies flat in bed No PND but does have some cough  Has electric heat has humidier but hasn't been running it  Allergies: 1)  * Sulfa (Sulfonamides) Group 2)  Buspar 3)  Augmentin  Past History:  Past medical, surgical, family and social histories (including risk factors) reviewed for relevance to current acute and chronic problems.  Past Medical History: Reviewed history from 11/02/2008 and no changes required. Allergic rhinitis Anxiety Coronary artery disease---------------------------------------Dr B  Brodie Diabetes mellitus, type II GERD Hypertension Osteoarthritis Duodenal polyp 12/09-------------------------------------------Dr D Brodie Hyperlipidemia AF S/P A flutter ablation Diastolic CHF       Benign prostatic hypertrophy----------------------------------Dr Cope  Past Surgical History: Reviewed history from 05/29/2006 and no changes required. Multiple angioplasties and stents 6/01 Atrial flutter 1/02  CABG 2/02  Atrial tach/chest pain 7/05 RCA stent 12/07 circumcsion  Family History: Reviewed history from 05/29/2006 and no changes required. Mom died @59   DM and ESRD Dad died @67   CVA Sister with CAD, HTN, CAD  Social History: Reviewed  history from 11/16/2008 and no changes required. Retired--disabled due to CAD Married--4 children Never Smoked Alcohol use-no--quit 30 years ago No ilicit drugs  Review of Systems       some sinus stuff ongoing acid reflux which is bothering him No fever Not really sick  Physical Exam  General:  alert.  NAD Neck:  supple, no masses, and no cervical lymphadenopathy.   Lungs:  normal respiratory effort, no intercostal retractions, and no accessory muscle use.  [place] on right but no crackles or wheezes Heart:  normal rate, regular rhythm, no murmur, and no gallop.   Extremities:  2+ tense edema in calves Psych:  normally interactive, good eye contact, and slightly anxious.     Impression & Recommendations:  Problem # 1:  CHEST PAIN (ICD-786.50) Assessment New  recurrent atypical symptoms EKG shows sinus brady but is otherwise normal Reassured---doesn't sound like ischemia  Orders: EKG w/ Interpretation (93000)  Problem # 2:  GERD (ICD-530.81) Assessment: Deteriorated nighttime cough also will add evening ranitidine discussed humidifier with the dry air  His updated medication list for this problem includes:    Prilosec 20 Mg Cpdr (Omeprazole) .Marland Kitchen... Take 1 tablet by mouth once a day    Ranitidine Hcl 150 Mg Tabs (Ranitidine hcl) .Marland Kitchen... 1 tab at bedtime to prevent acid reflux  Problem # 3:  DIABETES MELLITUS, TYPE II (ICD-250.00) Assessment: Deteriorated will restart insulin lantus--will titrate  His updated medication list for this problem includes:    Glipizide 10 Mg Tb24 (Glipizide) .Marland Kitchen... Take 1 tablet by mouth once a day    Lisinopril 20 Mg Tabs (Lisinopril) .Marland Kitchen... Take one tablet by mouth daily  Aspirin 81 Mg Tabs (Aspirin) .Marland Kitchen... Take one by mouth daily    Lantus Solostar 100 Unit/ml Soln (Insulin glargine) .Marland KitchenMarland KitchenMarland KitchenMarland Kitchen 10 units daily at bedtime. increase as directed  Complete Medication List: 1)  Metoprolol Tartrate 100 Mg Tabs (Metoprolol tartrate) .... Take one  tablet by mouth twice a day 2)  Glipizide 10 Mg Tb24 (Glipizide) .... Take 1 tablet by mouth once a day 3)  Potassium Chloride Crys Cr 20 Meq Tbcr (Potassium chloride crys cr) .... Take 1 tablet by mouth two times a day 4)  Warfarin Sodium 5 Mg Tabs (Warfarin sodium) .... Take one by mouth as directed 5)  Isosorbide Mononitrate Cr 30 Mg Xr24h-tab (Isosorbide mononitrate) .... Take one tablet once daily 6)  Plavix 75 Mg Tabs (Clopidogrel bisulfate) .... Take one by mouth daily 7)  Furosemide 80 Mg Tabs (Furosemide) .... Take one tablet two times a day 8)  Amiodarone Hcl 200 Mg Tabs (Amiodarone hcl) .... Take one by mouth daily 9)  Lisinopril 20 Mg Tabs (Lisinopril) .... Take one tablet by mouth daily 10)  Multivitamins Tabs (Multiple vitamin) .... Take one by mouth daily  (caused chest pain) pt stopped 11)  Aspirin 81 Mg Tabs (Aspirin) .... Take one by mouth daily 12)  Nitrostat 0.4 Mg Subl (Nitroglycerin) .... Use sl prn 13)  Tramadol Hcl 50 Mg Tabs (Tramadol hcl) .... Take 1/2-1  by mouth three  times a day as needed 14)  Fluticasone Propionate 50 Mcg/act Susp (Fluticasone propionate) .... 2 sprays each nostril daily as needed 15)  Tylenol Extra Strength 500 Mg Tabs (Acetaminophen) .... As needed 16)  Prilosec 20 Mg Cpdr (Omeprazole) .... Take 1 tablet by mouth once a day 17)  Pravastatin Sodium 40 Mg Tabs (Pravastatin sodium) .... Take 1 tablet by mouth once daily 18)  Bayer Contour Test Strp (Glucose blood) .... Check blood sugar three times a day 19)  Flomax 0.4 Mg Xr24h-cap (Tamsulosin hcl) .Marland Kitchen.. 1 tab daily 20)  Amlodipine Besylate 10 Mg Tabs (Amlodipine besylate) .... Take one tablet by mouth daily 21)  Ranitidine Hcl 150 Mg Tabs (Ranitidine hcl) .Marland Kitchen.. 1 tab at bedtime to prevent acid reflux 22)  Lantus Solostar 100 Unit/ml Soln (Insulin glargine) .Marland Kitchen.. 10 units daily at bedtime. increase as directed 23)  Pen Needles 5/16" 30g X 8 Mm Misc (Insulin pen needle) .... Use with lantus  solostar  Patient Instructions: 1)  Please start ranitidine 150mg  (over the counter) at bedtime to prevent acid reflux 2)  Please start the lantus tonight at 10 units. If the fasting sugar is still over 180 by Jan 25th, go up to 15 units. If it is still over 180 by Feb 1st, go up to 20 units. Then up to 25 units on Feb 6th if still over 180 3)  Please schedule a follow-up appointment in 2 months.  Prescriptions: PEN NEEDLES 5/16" 30G X 8 MM MISC (INSULIN PEN NEEDLE) use with lantus solostar  #100 x 3   Entered and Authorized by:   Cindee Salt MD   Signed by:   Cindee Salt MD on 02/18/2010   Method used:   Electronically to        [facility]  [address] [phone][facility] (retail)       [address]       [place], [place]  [address]       Ph: [phone] or [phone]       Fax:  [phone]   RxID:   [id number] LANTUS SOLOSTAR 100 UNIT/ML SOLN (INSULIN GLARGINE) 10 units daily at bedtime. Increase as directed  #1000units x 11   Entered and Authorized by:   Cindee Salt MD   Signed by:   Cindee Salt MD on 02/18/2010   Method used:   [facility]ronically to        [facility]  [address] [phone].* (retail)       [address]       [place], [place]  [address]       Ph: [phone] or [phone]       Fax: [phone]   RxID:   [id number]    Orders Added: 1)  Est. Patient Level IV [18841] 2)  EKG w/ Interpretation [93000]    Current Allergies (reviewed today): * SULFA (SULFONAMIDES) GROUP BUSPAR AUGMENTIN

## 2010-03-03 NOTE — Consult Note (Signed)
Summary: [facility]   Imported By: Lanelle Bal 09/13/2009 09:27:29  _____________________________________________________________________  External Attachment:    Type:   Image     Comment:   External Document

## 2010-03-03 NOTE — Assessment & Plan Note (Signed)
Summary: GROIN PAIN/CLE   Vital Signs:  Patient profile:   77 year old male Height:      70 inches Weight:      226 pounds BMI:     32.54 Temp:     97.4 degrees F oral Pulse rate:   60 / minute Pulse rhythm:   regular BP sitting:   130 / 60  (left arm) Cuff size:   large  Vitals Entered By: Benny Lennert CMA [facility] Dull) (April 06, 2009 11:46 AM)  History of Present Illness: Chief complaint groin pain  77 yo male with a history of multiple medical problems: on Coumadin and amiodarone  Groin pain:  Starting on Friday. Started to get some pain in the back and down into his groin. Then the other night, was having some pain in his groin, he has had some mild pain in his back as well.  some cold signs: Nasal congestion and coughing  pain at posterior testicle: on further questioning, the patient does have pain in his posterior testicle, and this was noticed on my examination.on further questioning, the patient does have pain in the posterior testicle, no history of trauma.  ros: the shoulder that are recently saw him for his improved. No active chest pain. No significant edema. No coughing. No bowel incontinence  Allergies: 1)  * Sulfa (Sulfonamides) Group 2)  Buspar 3)  Augmentin  Past History:  Past medical, surgical, family and social histories (including risk factors) reviewed, and no changes noted (except as noted below).  Past Medical History: Reviewed history from 11/02/2008 and no changes required. Allergic rhinitis Anxiety Coronary artery disease---------------------------------------Dr B  Brodie Diabetes mellitus, type II GERD Hypertension Osteoarthritis Duodenal polyp 12/09-------------------------------------------Dr D Brodie Hyperlipidemia AF S/P A flutter ablation Diastolic CHF       Benign prostatic hypertrophy----------------------------------Dr Cope  Past Surgical History: Reviewed history from 05/29/2006 and no changes required. Multiple  angioplasties and stents 6/01 Atrial flutter 1/02  CABG 2/02  Atrial tach/chest pain 7/05 RCA stent 12/07 circumcsion  Family History: Reviewed history from 05/29/2006 and no changes required. Mom died @59   DM and ESRD Dad died @67   CVA Sister with CAD, HTN, CAD  Social History: Reviewed history from 11/16/2008 and no changes required. Retired--disabled due to CAD Married--4 children Never Smoked Alcohol use-no--quit 30 years ago No ilicit drugs  Physical Exam  General:  alert.  NADwell-developed.  well-nourished and well-hydrated.   Head:  normocephalic and atraumatic.  no abnormalities observed and no abnormalities palpated.   Ears:  no external deformities.   Nose:  no external deformity.   Lungs:  Normal respiratory effort, chest expands symmetrically. Lungs are clear to auscultation, no crackles or wheezes. Heart:  normal rate, regular rhythm, no murmur, and no gallop.   Genitalia:  right posterior testes there is some tenderness at the epididymis. Nontender left testicle Msk:  patient does have mild tenderness at the groin medially on the right side.  The pain is hip abduction, no pain with internal and external patient had the articular surface of the right hip. With hip maneuvers, the patient does have some knee pain.  Does have some mild back pain and pain at the SI joints bilaterally.  Strength testing is preserved in the lower extremities.   Impression & Recommendations:  Problem # 1:  GROIN STRAIN, RIGHT (ICD-848.8) Assessment New mild muscle strain, right groin, this is improved over the last few days.  Date of onset, approximately March 22, 2009  Problem # 2:  EPIDIDYMITIS, RIGHT (ICD-604.90) Assessment: New treatment doxycycline, limited on medication choice given interactions with Coumadin and amiodarone.  Recheck INR in one week, 6 days  Complete Medication List: 1)  Metoprolol Tartrate 100 Mg Tabs (Metoprolol tartrate) .... Take one tablet by  mouth twice a day 2)  Glipizide 10 Mg Tb24 (Glipizide) .... Take 1 tablet by mouth once a day 3)  Potassium Chloride Crys Cr 20 Meq Tbcr (Potassium chloride crys cr) .... Take 1 tablet by mouth two times a day 4)  Warfarin Sodium 5 Mg Tabs (Warfarin sodium) .... Take one by mouth as directed 5)  Isosorbide Dinitrate 30 Mg Tabs (Isosorbide dinitrate) .... Take one by mouth daily 6)  Plavix 75 Mg Tabs (Clopidogrel bisulfate) .... Take one by mouth daily 7)  Furosemide 80 Mg Tabs (Furosemide) .... Take one tablet two times a day 8)  Amiodarone Hcl 200 Mg Tabs (Amiodarone hcl) .... Take one by mouth daily 9)  Lisinopril 20 Mg Tabs (Lisinopril) .... Take one tablet by mouth daily 10)  Multivitamins Tabs (Multiple vitamin) .... Take one by mouth daily  (caused chest pain) pt stopped 11)  Aspirin 81 Mg Tabs (Aspirin) .... Take one by mouth daily 12)  Nitrostat 0.4 Mg Subl (Nitroglycerin) .... Use sl prn 13)  Tramadol Hcl 50 Mg Tabs (Tramadol hcl) .... Take 1/2-1  by mouth two times a day as needed 14)  Fluticasone Propionate 50 Mcg/act Susp (Fluticasone propionate) .... 2 sprays each nostril daily as needed 15)  Tylenol Extra Strength 500 Mg Tabs (Acetaminophen) .... As needed 16)  Prilosec 20 Mg Cpdr (Omeprazole) .... Take 1 tablet by mouth once a day 17)  Pravastatin Sodium 40 Mg Tabs (Pravastatin sodium) .... Take 1 tablet by mouth once daily 18)  Bayer Contour Test Strp (Glucose blood) .... Check blood sugar three times a day 19)  Flomax 0.4 Mg Xr24h-cap (Tamsulosin hcl) .Marland Kitchen.. 1 tab daily 20)  Amlodipine Besylate 10 Mg Tabs (Amlodipine besylate) .... Take one tablet by mouth daily 21)  Doxycycline Hyclate 100 Mg Caps (Doxycycline hyclate) .... Take 1 tab twice a day  Patient Instructions: 1)  recheck PT / INR in 1 week on Monday Prescriptions: DOXYCYCLINE HYCLATE 100 MG CAPS (DOXYCYCLINE HYCLATE) Take 1 tab twice a day  #28 x 0   Entered and Authorized by:   Hannah Beat MD   Signed by:    Hannah Beat MD on 04/06/2009   Method used:   [facility] to        [facility]  [address] [phone].* (retail)       [address]       [place], [place]  [address]       Ph: [phone] or [phone]       Fax: [phone]   RxID:   [id number]     Current Allergies (reviewed today): * SULFA (SULFONAMIDES) GROUP BUSPAR AUGMENTIN

## 2010-03-08 ENCOUNTER — Encounter: Payer: Self-pay | Admitting: Internal Medicine

## 2010-03-09 NOTE — Assessment & Plan Note (Signed)
Summary: CONGESTION,COUGH,HA/CLE  TODAYS OPTIONS   Vital Signs:  Patient profile:   77 year old male Weight:      229.75 pounds Temp:     98.3 degrees F oral Pulse rate:   56 / minute Pulse rhythm:   regular BP sitting:   110 / 60  (left arm) Cuff size:   large  Vitals Entered By: Selena Batten Dance CMA [facility] Dull) (March 03, 2010 2:51 PM) CC: Cough/Congestion/Headache x1 week   History of Present Illness: CC: cough, HA, congestion  78yo diabetic, CAD s/p CABG 2002, HTN, HLD, afib on coumadin, amiodarone, dCHF, presents with 1 1/2 wk h/o nose stopped up, bad pain through head, hurting in chest as well - sore with cough.  Coughing up white mucous.  Also body aches.  + chills, can't sleep at night because wakes up coughing.  Feels congestion in head and chest.  + blowing clear mucous out of nose.  + facial pain.  Sinus headache, sharp throughout face.  ear pain as well.  + ST, + PNdrip.  No fevers, abd pain, n/v/d, rashes, tooth pain.  flonase not helping.  + h/o sinus infection.  no smokers at home.  in chart augmentin allergy, unspecified, however pt says he has tolerated penicillin in past.   Current Medications (verified): 1)  Metoprolol Tartrate 100 Mg Tabs (Metoprolol Tartrate) .... Take One Tablet By Mouth Twice A Day 2)  Glipizide 10 Mg Tb24 (Glipizide) .... Take 1 Tablet By Mouth Once A Day 3)  Potassium Chloride Crys Cr 20 Meq Tbcr (Potassium Chloride Crys Cr) .... Take 1 Tablet By Mouth Two Times A Day 4)  Warfarin Sodium 5 Mg Tabs (Warfarin Sodium) .... Take One By Mouth As Directed 5)  Isosorbide Mononitrate Cr 30 Mg Xr24h-Tab (Isosorbide Mononitrate) .... Take One Tablet Once Daily 6)  Plavix 75 Mg Tabs (Clopidogrel Bisulfate) .... Take One By Mouth Daily 7)  Furosemide 80 Mg  Tabs (Furosemide) .... Take One Tablet Two Times A Day 8)  Amiodarone Hcl 200 Mg Tabs (Amiodarone Hcl) .... Take One By Mouth Daily 9)  Lisinopril 20 Mg Tabs (Lisinopril) .... Take One Tablet By Mouth  Daily 10)  Multivitamins  Tabs (Multiple Vitamin) .... Take One By Mouth Daily  (Caused Chest Pain) Pt Stopped 11)  Aspirin 81 Mg Tabs (Aspirin) .... Take One By Mouth Daily 12)  Nitrostat 0.4 Mg Subl (Nitroglycerin) .... Use Sl Prn 13)  Tramadol Hcl 50 Mg Tabs (Tramadol Hcl) .... Take 1/2-1  By Mouth Three  Times A Day As Needed 14)  Fluticasone Propionate 50 Mcg/act  Susp (Fluticasone Propionate) .... 2 Sprays Each Nostril Daily As Needed 15)  Tylenol Extra Strength 500 Mg  Tabs (Acetaminophen) .... As Needed 16)  Prilosec 20 Mg  Cpdr (Omeprazole) .... Take 1 Tablet By Mouth Once A Day 17)  Pravastatin Sodium 40 Mg Tabs (Pravastatin Sodium) .... Take 1 Tablet By Mouth Once Daily 18)  Bayer Contour Test  Strp (Glucose Blood) .... Check Blood Sugar Three Times A Day 19)  Flomax 0.4 Mg Xr24h-Cap (Tamsulosin Hcl) .Marland Kitchen.. 1 Tab Daily 20)  Amlodipine Besylate 10 Mg Tabs (Amlodipine Besylate) .... Take One Tablet By Mouth Daily 21)  Ranitidine Hcl 150 Mg Tabs (Ranitidine Hcl) .Marland Kitchen.. 1 Tab At Bedtime To Prevent Acid Reflux 22)  Lantus Solostar 100 Unit/ml Soln (Insulin Glargine) .Marland Kitchen.. 10 Units Daily At Bedtime. Increase As Directed 23)  Pen Needles 5/16" 30g X 8 Mm Misc (Insulin Pen Needle) .... Use With  Lantus Solostar  Allergies: 1)  * Sulfa (Sulfonamides) Group 2)  Buspar 3)  Augmentin  Past History:  Past Medical History: Last updated: 11/02/2008 Allergic rhinitis Anxiety Coronary artery disease---------------------------------------Dr B  Brodie Diabetes mellitus, type II GERD Hypertension Osteoarthritis Duodenal polyp 12/09-------------------------------------------Dr D Brodie Hyperlipidemia AF S/P A flutter ablation Diastolic CHF       Benign prostatic hypertrophy----------------------------------Dr Cope  Social History: Last updated: 11/16/2008 Retired--disabled due to CAD Married--4 children Never Smoked Alcohol use-no--quit 30 years ago No ilicit drugs  Review of  Systems       per HPI  Physical Exam  General:  alert.  congested Head:  + R max > frontal sinus tenderness but overall tender sinuses bilaterally Eyes:  No corneal or conjunctival inflammation noted. EOMI. Perrla.  Ears:  bilateral canals with cerumen Nose:  some discharge bilaterally, swollen turbinates Mouth:  no erythema and no exudates.   Neck:  supple, no masses, and no cervical lymphadenopathy.   Lungs:  normal respiratory effort, no intercostal retractions, and no accessory muscle use.  no crackles or wheezing Heart:  normal rate, regular rhythm, no murmur, and no gallop.   Pulses:  2+ rad pulses Skin:  no rashes.     Impression & Recommendations:  Problem # 1:  SINUSITIS, ACUTE (ICD-461.9) treat supportively as per instructions.  zpack to fill.  allergy to sulfa.  allergy to augmentin in chart.  pt states can tolerate pcn though.  red flags to return discussed.  His updated medication list for this problem includes:    Fluticasone Propionate 50 Mcg/act Susp (Fluticasone propionate) .Marland Kitchen... 2 sprays each nostril daily as needed    Zithromax Z-pak 250 Mg Tabs (Azithromycin) ..... Use as directed  Complete Medication List: 1)  Metoprolol Tartrate 100 Mg Tabs (Metoprolol tartrate) .... Take one tablet by mouth twice a day 2)  Glipizide 10 Mg Tb24 (Glipizide) .... Take 1 tablet by mouth once a day 3)  Potassium Chloride Crys Cr 20 Meq Tbcr (Potassium chloride crys cr) .... Take 1 tablet by mouth two times a day 4)  Warfarin Sodium 5 Mg Tabs (Warfarin sodium) .... Take one by mouth as directed 5)  Isosorbide Mononitrate Cr 30 Mg Xr24h-tab (Isosorbide mononitrate) .... Take one tablet once daily 6)  Plavix 75 Mg Tabs (Clopidogrel bisulfate) .... Take one by mouth daily 7)  Furosemide 80 Mg Tabs (Furosemide) .... Take one tablet two times a day 8)  Amiodarone Hcl 200 Mg Tabs (Amiodarone hcl) .... Take one by mouth daily 9)  Lisinopril 20 Mg Tabs (Lisinopril) .... Take one tablet  by mouth daily 10)  Multivitamins Tabs (Multiple vitamin) .... Take one by mouth daily  (caused chest pain) pt stopped 11)  Aspirin 81 Mg Tabs (Aspirin) .... Take one by mouth daily 12)  Nitrostat 0.4 Mg Subl (Nitroglycerin) .... Use sl prn 13)  Tramadol Hcl 50 Mg Tabs (Tramadol hcl) .... Take 1/2-1  by mouth three  times a day as needed 14)  Fluticasone Propionate 50 Mcg/act Susp (Fluticasone propionate) .... 2 sprays each nostril daily as needed 15)  Tylenol Extra Strength 500 Mg Tabs (Acetaminophen) .... As needed 16)  Prilosec 20 Mg Cpdr (Omeprazole) .... Take 1 tablet by mouth once a day 17)  Pravastatin Sodium 40 Mg Tabs (Pravastatin sodium) .... Take 1 tablet by mouth once daily 18)  Bayer Contour Test Strp (Glucose blood) .... Check blood sugar three times a day 19)  Flomax 0.4 Mg Xr24h-cap (Tamsulosin hcl) .Marland Kitchen.. 1 tab daily  20)  Amlodipine Besylate 10 Mg Tabs (Amlodipine besylate) .... Take one tablet by mouth daily 21)  Ranitidine Hcl 150 Mg Tabs (Ranitidine hcl) .Marland Kitchen.. 1 tab at bedtime to prevent acid reflux 22)  Lantus Solostar 100 Unit/ml Soln (Insulin glargine) .Marland Kitchen.. 10 units daily at bedtime. increase as directed 23)  Pen Needles 5/16" 30g X 8 Mm Misc (Insulin pen needle) .... Use with lantus solostar 24)  Zithromax Z-pak 250 Mg Tabs (Azithromycin) .... Use as directed  Patient Instructions: 1)  I would do lantus daily. 2)  You have a sinus infection. 3)  Take medicines as prescribed: azithromycin 4)  Take guaifenesin 400mg  IR 1 pill in am and at noon with plenty of fluid to help mobilize mucous or Simple Mucinex 5)  Use nasal saline spray or neti pot to help drainage of sinuses. 6)  Continue flonase 7)  robitussin DM for cough (check with pharmacist about sugar) 8)  If you start having fevers >101.5, trouble breathing, or are worsening instead of improving as expected, you may need to be seen again. 9)  Good to see you today, call clinic with questions.   Prescriptions: ZITHROMAX Z-PAK 250 MG TABS (AZITHROMYCIN) use as directed  #1 x 0   Entered and Authorized by:   Eustaquio Boyden  MD   Signed by:   Eustaquio Boyden  MD on 03/03/2010   Method used:   [facility] to        [facility]  [address] [phone].* (retail)       [address]       [place], [place]  [address]       Ph: [phone] or [phone]       Fax: [phone]   RxID:   [id number]    Orders Added: 1)  Est. Patient Level III [56433]    Current Allergies (reviewed today): * SULFA (SULFONAMIDES) GROUP BUSPAR AUGMENTIN

## 2010-03-16 ENCOUNTER — Encounter: Payer: Self-pay | Admitting: *Deleted

## 2010-03-16 DIAGNOSIS — D132 Benign neoplasm of duodenum: Secondary | ICD-10-CM

## 2010-03-16 DIAGNOSIS — I5031 Acute diastolic (congestive) heart failure: Secondary | ICD-10-CM | POA: Insufficient documentation

## 2010-03-16 DIAGNOSIS — E785 Hyperlipidemia, unspecified: Secondary | ICD-10-CM

## 2010-03-16 DIAGNOSIS — I2510 Atherosclerotic heart disease of native coronary artery without angina pectoris: Secondary | ICD-10-CM

## 2010-03-16 DIAGNOSIS — K219 Gastro-esophageal reflux disease without esophagitis: Secondary | ICD-10-CM | POA: Insufficient documentation

## 2010-03-16 DIAGNOSIS — M1990 Unspecified osteoarthritis, unspecified site: Secondary | ICD-10-CM

## 2010-03-16 DIAGNOSIS — I10 Essential (primary) hypertension: Secondary | ICD-10-CM | POA: Insufficient documentation

## 2010-03-16 DIAGNOSIS — D649 Anemia, unspecified: Secondary | ICD-10-CM | POA: Insufficient documentation

## 2010-03-16 DIAGNOSIS — N400 Enlarged prostate without lower urinary tract symptoms: Secondary | ICD-10-CM | POA: Insufficient documentation

## 2010-03-16 DIAGNOSIS — Z9889 Other specified postprocedural states: Secondary | ICD-10-CM | POA: Insufficient documentation

## 2010-03-16 DIAGNOSIS — F419 Anxiety disorder, unspecified: Secondary | ICD-10-CM

## 2010-03-16 DIAGNOSIS — Z8679 Personal history of other diseases of the circulatory system: Secondary | ICD-10-CM

## 2010-03-18 ENCOUNTER — Encounter: Payer: Self-pay | Admitting: Internal Medicine

## 2010-03-18 ENCOUNTER — Ambulatory Visit (INDEPENDENT_AMBULATORY_CARE_PROVIDER_SITE_OTHER): Payer: PRIVATE HEALTH INSURANCE

## 2010-03-18 DIAGNOSIS — M1710 Unilateral primary osteoarthritis, unspecified knee: Secondary | ICD-10-CM

## 2010-03-18 DIAGNOSIS — Z5181 Encounter for therapeutic drug level monitoring: Secondary | ICD-10-CM

## 2010-03-18 DIAGNOSIS — Z7901 Long term (current) use of anticoagulants: Secondary | ICD-10-CM

## 2010-03-18 DIAGNOSIS — I80299 Phlebitis and thrombophlebitis of other deep vessels of unspecified lower extremity: Secondary | ICD-10-CM

## 2010-03-18 LAB — CONVERTED CEMR LAB: Prothrombin Time: 41.9 s

## 2010-03-23 NOTE — Medication Information (Signed)
Summary: PROTIME/TW   PCP: Cindee Salt MD Indication 1: Deep venous thrombosis PT 41.9 INR RANGE 2.0-3.5           Allergies: 1)  * Sulfa (Sulfonamides) Group 2)  Buspar 3)  Augmentin  Anticoagulation Management History:      Positive risk factors for bleeding include an age of 25 years or older and presence of serious comorbidities.  The bleeding index is 'intermediate risk'.  Positive CHADS2 values include History of CHF, History of HTN, Age > 75 years old, and History of Diabetes.  His last INR was 3.0 and today's INR is 3.5.  Prothrombin time is 41.9.    Anticoagulation Management Assessment/Plan:      The patient's current anticoagulation dose is Warfarin sodium 5 mg tabs: Take one by mouth as directed.  The next INR is due 4 weeks.        Laboratory Results   Blood Tests   Date/Time Recieved: March 18, 2010 9:41 AM  Date/Time Reported: March 18, 2010 9:41 AM   PT: 41.9 s   (Normal Range: 10.6-13.4)  INR: 3.5   (Normal Range: 0.88-1.12   Therap INR: 2.0-3.5)      ANTICOAGULATION RECORD PREVIOUS REGIMEN & LAB RESULTS Anticoagulation Diagnosis:  Deep venous thrombosis on  01/27/2009 Previous INR Goal Range:  2.0-3.5 on  01/27/2009 Previous INR:  3.0 on  02/18/2010 Previous Coumadin Dose(mg):   5mg  qd, 7.5mg  T,TH,SAT on  10/08/2009 Previous Regimen:   5mg  qd, 7.5mg  T,TH  on  02/18/2010 Previous Coagulation Comments:  . on  02/18/2010  NEW REGIMEN & LAB RESULTS Current INR: 3.5 Regimen:  5mg  qd, 7.5mg  T,TH   (no change)  Provider: letvak      Repeat testing in: 4 weeks MEDICATIONS METOPROLOL TARTRATE 100 MG TABS (METOPROLOL TARTRATE) take one tablet by mouth twice a day GLIPIZIDE 10 MG TB24 (GLIPIZIDE) Take 1 tablet by mouth once a day POTASSIUM CHLORIDE CRYS CR 20 MEQ TBCR (POTASSIUM CHLORIDE CRYS CR) Take 1 tablet by mouth two times a day WARFARIN SODIUM 5 MG TABS (WARFARIN SODIUM) Take one by mouth as directed ISOSORBIDE MONONITRATE CR  30 MG XR24H-TAB (ISOSORBIDE MONONITRATE) take one tablet once daily PLAVIX 75 MG TABS (CLOPIDOGREL BISULFATE) Take one by mouth daily FUROSEMIDE 80 MG  TABS (FUROSEMIDE) take one tablet two times a day AMIODARONE HCL 200 MG TABS (AMIODARONE HCL) Take one by mouth daily LISINOPRIL 20 MG TABS (LISINOPRIL) Take one tablet by mouth daily MULTIVITAMINS  TABS (MULTIPLE VITAMIN) Take one by mouth daily  (caused chest pain) PT STOPPED ASPIRIN 81 MG TABS (ASPIRIN) Take one by mouth daily NITROSTAT 0.4 MG SUBL (NITROGLYCERIN) Use SL prn TRAMADOL HCL 50 MG TABS (TRAMADOL HCL) Take 1/2-1  by mouth three  times a day as needed FLUTICASONE PROPIONATE 50 MCG/ACT  SUSP (FLUTICASONE PROPIONATE) 2 sprays each nostril daily as needed TYLENOL EXTRA STRENGTH 500 MG  TABS (ACETAMINOPHEN) as needed PRILOSEC 20 MG  CPDR (OMEPRAZOLE) Take 1 tablet by mouth once a day PRAVASTATIN SODIUM 40 MG TABS (PRAVASTATIN SODIUM) take 1 tablet by mouth once daily BAYER CONTOUR TEST  STRP (GLUCOSE BLOOD) check blood sugar three times a day FLOMAX 0.4 MG XR24H-CAP (TAMSULOSIN HCL) 1 tab daily AMLODIPINE BESYLATE 10 MG TABS (AMLODIPINE BESYLATE) Take one tablet by mouth daily RANITIDINE HCL 150 MG TABS (RANITIDINE HCL) 1 tab at bedtime to prevent acid reflux LANTUS SOLOSTAR 100 UNIT/ML SOLN (INSULIN GLARGINE) 10 units daily at bedtime. Increase as directed PEN NEEDLES 5/16"  30G X 8 MM MISC (INSULIN PEN NEEDLE) use with lantus solostar ZITHROMAX Z-PAK 250 MG TABS (AZITHROMYCIN) use as directed  Dose has been reviewed with patient or caretaker during this visit.  Reviewed by: Allison Quarry  Anticoagulation Visit Questionnaire      Coumadin dose missed/changed:  No      Abnormal Bleeding Symptoms:  No Any diet changes including alcohol intake, vegetables or greens since the last visit:  No Any illnesses or hospitalizations since the last visit:  No Any signs of clotting since the last visit (including chest discomfort, dizziness,  shortness of breath, arm tingling, slurred speech, swelling or redness in leg):  No

## 2010-04-22 ENCOUNTER — Telehealth: Payer: Self-pay | Admitting: *Deleted

## 2010-04-22 ENCOUNTER — Ambulatory Visit (INDEPENDENT_AMBULATORY_CARE_PROVIDER_SITE_OTHER): Payer: PRIVATE HEALTH INSURANCE | Admitting: Internal Medicine

## 2010-04-22 ENCOUNTER — Encounter: Payer: Self-pay | Admitting: Internal Medicine

## 2010-04-22 VITALS — BP 155/67 | HR 51 | Temp 98.1°F | Ht 70.0 in | Wt 228.0 lb

## 2010-04-22 DIAGNOSIS — I82409 Acute embolism and thrombosis of unspecified deep veins of unspecified lower extremity: Secondary | ICD-10-CM

## 2010-04-22 DIAGNOSIS — Z5181 Encounter for therapeutic drug level monitoring: Secondary | ICD-10-CM

## 2010-04-22 DIAGNOSIS — Z7902 Long term (current) use of antithrombotics/antiplatelets: Secondary | ICD-10-CM

## 2010-04-22 DIAGNOSIS — Z7901 Long term (current) use of anticoagulants: Secondary | ICD-10-CM

## 2010-04-22 DIAGNOSIS — E119 Type 2 diabetes mellitus without complications: Secondary | ICD-10-CM

## 2010-04-22 NOTE — Patient Instructions (Signed)
Please start the lantus tonight at 10 units. Check fasting sugar every morning. On March 29th, if sugars still over 160, increase to 15 units. On April 4th, if sugars still above 160, increase to 20 units. On April 9th, if fasting sugars still over 160, increase the lantus to 25 units. On April 14th, if sugars still over 160, increase lantus to 30 units. On May 1st, if sugars still regularly over 160-170 in the morning, please call

## 2010-04-22 NOTE — Telephone Encounter (Signed)
INR monitoring enrollment  

## 2010-04-22 NOTE — Progress Notes (Signed)
  Subjective:    Patient ID: [name], male    DOB: [birth year], 77 y.o.   MRN: [id number]  HPI Didn't fill Rx for lantus due to cost-- was $200 Then finally got it Then lost the instructions after giving one dose and didn't give any more  Discussed diet Bakes food mostly--occ fried chicken Not always healthy breakfasts--counselled  Checks sugars up to 3 times per day Needs to cut back to no more than twice a day at most Still over 200 generally--fasting    Review of Systems     Objective:   Physical Exam        Assessment & Plan:

## 2010-05-08 LAB — DIFFERENTIAL
Basophils Absolute: 0 10*3/uL (ref 0.0–0.1)
Eosinophils Absolute: 0.1 10*3/uL (ref 0.0–0.7)
Eosinophils Relative: 1 % (ref 0–5)
Monocytes Absolute: 0.5 10*3/uL (ref 0.1–1.0)

## 2010-05-08 LAB — POCT CARDIAC MARKERS
CKMB, poc: 1.1 ng/mL (ref 1.0–8.0)
CKMB, poc: 1.5 ng/mL (ref 1.0–8.0)
Myoglobin, poc: 172 ng/mL (ref 12–200)
Myoglobin, poc: 262 ng/mL (ref 12–200)

## 2010-05-08 LAB — GLUCOSE, CAPILLARY
Glucose-Capillary: 108 mg/dL — ABNORMAL HIGH (ref 70–99)
Glucose-Capillary: 162 mg/dL — ABNORMAL HIGH (ref 70–99)
Glucose-Capillary: 214 mg/dL — ABNORMAL HIGH (ref 70–99)

## 2010-05-08 LAB — CBC
HCT: 35.5 % — ABNORMAL LOW (ref 39.0–52.0)
Hemoglobin: 11.6 g/dL — ABNORMAL LOW (ref 13.0–17.0)
Hemoglobin: 11.8 g/dL — ABNORMAL LOW (ref 13.0–17.0)
MCHC: 33.2 g/dL (ref 30.0–36.0)
MCV: 89 fL (ref 78.0–100.0)
MCV: 90.4 fL (ref 78.0–100.0)
Platelets: 141 10*3/uL — ABNORMAL LOW (ref 150–400)
Platelets: 161 10*3/uL (ref 150–400)
RBC: 3.78 MIL/uL — ABNORMAL LOW (ref 4.22–5.81)
RDW: 14.8 % (ref 11.5–15.5)
RDW: 14.9 % (ref 11.5–15.5)
WBC: 4.6 10*3/uL (ref 4.0–10.5)
WBC: 5.4 10*3/uL (ref 4.0–10.5)

## 2010-05-08 LAB — POCT I-STAT, CHEM 8
BUN: 27 mg/dL — ABNORMAL HIGH (ref 6–23)
Creatinine, Ser: <0.2 mg/dL — ABNORMAL LOW (ref 0.4–1.5)
Glucose, Bld: 117 mg/dL — ABNORMAL HIGH (ref 70–99)
Hemoglobin: 11.9 g/dL — ABNORMAL LOW (ref 13.0–17.0)
Sodium: 138 mEq/L (ref 135–145)
TCO2: 13 mmol/L (ref 0–100)

## 2010-05-08 LAB — URINALYSIS, ROUTINE W REFLEX MICROSCOPIC
Bilirubin Urine: NEGATIVE
Hgb urine dipstick: NEGATIVE
Ketones, ur: NEGATIVE mg/dL
Specific Gravity, Urine: 1.006 (ref 1.005–1.030)
Urobilinogen, UA: 1 mg/dL (ref 0.0–1.0)

## 2010-05-08 LAB — BASIC METABOLIC PANEL
BUN: 25 mg/dL — ABNORMAL HIGH (ref 6–23)
Calcium: 8.9 mg/dL (ref 8.4–10.5)
Creatinine, Ser: 2.11 mg/dL — ABNORMAL HIGH (ref 0.4–1.5)
GFR calc non Af Amer: 31 mL/min — ABNORMAL LOW (ref >60)
Glucose, Bld: 64 mg/dL — ABNORMAL LOW (ref 70–99)

## 2010-05-08 LAB — TROPONIN I
Troponin I: 0.01 ng/mL (ref 0.00–0.06)
Troponin I: 0.03 ng/mL (ref 0.00–0.06)

## 2010-05-08 LAB — CK TOTAL AND CKMB (NOT AT ARMC)
CK, MB: 2.2 ng/mL (ref 0.3–4.0)
CK, MB: 2.5 ng/mL (ref 0.3–4.0)
CK, MB: 2.6 ng/mL (ref 0.3–4.0)
Relative Index: 1.3 (ref 0.0–2.5)
Relative Index: 1.4 (ref 0.0–2.5)
Total CK: 182 U/L (ref 7–232)

## 2010-05-08 LAB — RETICULOCYTES: RBC.: 3.85 MIL/uL — ABNORMAL LOW (ref 4.22–5.81)

## 2010-05-08 LAB — LIPID PANEL
Cholesterol: 158 mg/dL (ref 0–200)
LDL Cholesterol: 105 mg/dL — ABNORMAL HIGH (ref 0–99)
Total CHOL/HDL Ratio: 5.3 RATIO

## 2010-05-08 LAB — IRON AND TIBC
Iron: 46 ug/dL (ref 42–135)
TIBC: 291 ug/dL (ref 215–435)
UIBC: 245 ug/dL

## 2010-05-08 LAB — PROTIME-INR
INR: 3.8 — ABNORMAL HIGH (ref 0.00–1.49)
Prothrombin Time: 40.9 seconds — ABNORMAL HIGH (ref 11.6–15.2)

## 2010-05-08 LAB — D-DIMER, QUANTITATIVE: D-Dimer, Quant: 0.32 ug/mL-FEU (ref 0.00–0.48)

## 2010-05-08 LAB — FERRITIN: Ferritin: 49 ng/mL (ref 22–322)

## 2010-05-10 ENCOUNTER — Ambulatory Visit: Payer: Self-pay | Admitting: Internal Medicine

## 2010-05-11 ENCOUNTER — Telehealth: Payer: Self-pay | Admitting: Radiology

## 2010-05-11 DIAGNOSIS — Z7901 Long term (current) use of anticoagulants: Secondary | ICD-10-CM

## 2010-05-11 DIAGNOSIS — I4891 Unspecified atrial fibrillation: Secondary | ICD-10-CM

## 2010-05-11 DIAGNOSIS — Z5181 Encounter for therapeutic drug level monitoring: Secondary | ICD-10-CM

## 2010-05-11 DIAGNOSIS — I80299 Phlebitis and thrombophlebitis of other deep vessels of unspecified lower extremity: Secondary | ICD-10-CM

## 2010-05-11 NOTE — Telephone Encounter (Signed)
Cancel this encounter

## 2010-05-26 ENCOUNTER — Other Ambulatory Visit: Payer: Self-pay | Admitting: Cardiovascular Disease

## 2010-05-26 MED ORDER — ISOSORBIDE MONONITRATE ER 30 MG PO TB24: 30.0000 mg | ORAL_TABLET | Freq: Every day | ORAL | Status: DC

## 2010-05-30 ENCOUNTER — Ambulatory Visit (INDEPENDENT_AMBULATORY_CARE_PROVIDER_SITE_OTHER): Payer: PRIVATE HEALTH INSURANCE | Admitting: Internal Medicine

## 2010-05-30 DIAGNOSIS — Z7901 Long term (current) use of anticoagulants: Secondary | ICD-10-CM

## 2010-05-30 DIAGNOSIS — Z5181 Encounter for therapeutic drug level monitoring: Secondary | ICD-10-CM

## 2010-05-30 DIAGNOSIS — I82409 Acute embolism and thrombosis of unspecified deep veins of unspecified lower extremity: Secondary | ICD-10-CM

## 2010-05-30 LAB — POCT INR: INR: 2.6

## 2010-06-02 ENCOUNTER — Encounter: Payer: Self-pay | Admitting: Cardiovascular Disease

## 2010-06-02 ENCOUNTER — Ambulatory Visit (INDEPENDENT_AMBULATORY_CARE_PROVIDER_SITE_OTHER): Payer: PRIVATE HEALTH INSURANCE | Admitting: Cardiovascular Disease

## 2010-06-02 DIAGNOSIS — E119 Type 2 diabetes mellitus without complications: Secondary | ICD-10-CM

## 2010-06-02 DIAGNOSIS — N259 Disorder resulting from impaired renal tubular function, unspecified: Secondary | ICD-10-CM

## 2010-06-02 DIAGNOSIS — I2510 Atherosclerotic heart disease of native coronary artery without angina pectoris: Secondary | ICD-10-CM

## 2010-06-02 DIAGNOSIS — I5032 Chronic diastolic (congestive) heart failure: Secondary | ICD-10-CM

## 2010-06-02 DIAGNOSIS — R609 Edema, unspecified: Secondary | ICD-10-CM | POA: Insufficient documentation

## 2010-06-02 DIAGNOSIS — I509 Heart failure, unspecified: Secondary | ICD-10-CM

## 2010-06-02 DIAGNOSIS — I7389 Other specified peripheral vascular diseases: Secondary | ICD-10-CM

## 2010-06-02 DIAGNOSIS — I5031 Acute diastolic (congestive) heart failure: Secondary | ICD-10-CM

## 2010-06-02 DIAGNOSIS — I25810 Atherosclerosis of coronary artery bypass graft(s) without angina pectoris: Secondary | ICD-10-CM

## 2010-06-02 DIAGNOSIS — I10 Essential (primary) hypertension: Secondary | ICD-10-CM

## 2010-06-02 DIAGNOSIS — E785 Hyperlipidemia, unspecified: Secondary | ICD-10-CM

## 2010-06-02 DIAGNOSIS — I4891 Unspecified atrial fibrillation: Secondary | ICD-10-CM

## 2010-06-02 MED ORDER — POTASSIUM CHLORIDE CRYS ER 20 MEQ PO TBCR: 20.0000 meq | EXTENDED_RELEASE_TABLET | Freq: Two times a day (BID) | ORAL | Status: DC

## 2010-06-02 NOTE — Assessment & Plan Note (Signed)
His edema is likely multifactorial. If his PA pressures are normal, we will stop the amlodipine and change to an alternate blood pressure medication.

## 2010-06-02 NOTE — Assessment & Plan Note (Signed)
We'll continue pravastatin for now. I did a goal LDL is less than 70.

## 2010-06-02 NOTE — Assessment & Plan Note (Signed)
He has worsening lower extremity claudication type symptoms. We have ordered repeat lower extremity arterial ultrasound.

## 2010-06-02 NOTE — Assessment & Plan Note (Signed)
History of diastolic heart failure. His edema is concerning for fluid overload. We have ordered an echocardiogram to evaluate his PA pressures. This will help guide our Lasix/diuretic regimen.

## 2010-06-02 NOTE — Assessment & Plan Note (Signed)
Maintaining sinus rhythm. Have not changed any of his medications.

## 2010-06-02 NOTE — Progress Notes (Signed)
   Patient ID: [name], male    DOB: 1931/03/07, 77 y.o.   MRN: [id number]  HPI Comments: 77 yo AAM with history of CAD s/p CABG and stent in LIMA in 2007, HTN, DM, paroxysmal atrial fibrillation, history of diastolic heart failure, Hyperlipidemia, CRI,  and known PVD with reduced ABI in the past who Presents today to establish care in the [place] office.  He reports that it has been several years since he has had any cardiac or lower extremity arterial workup. He has worsening leg edema, worsening pain in his legs with walking. He also has shortness of breath. He has low energy and does not walk very far as his legs feel heavy. He has been maintained on Lasix 80 mg b.i.d. Notes indicate chronic renal insufficiency.   He denies any significant chest pain or lightheadedness.   Doppler studies done in 2006 which showed a reduced indices on the right of 0.6. Intervention of the lower extremities was not done previously secondary to chronic renal insufficiency  EKG shows sinus bradycardia with rate 55 beats per minute with no significant ST or T wave changes         Review of Systems  Constitutional: Positive for fatigue.  HENT: Negative.   Eyes: Negative.   Respiratory: Positive for shortness of breath.   Cardiovascular: Positive for leg swelling.  Gastrointestinal: Negative.   Musculoskeletal: Negative.   Skin: Negative.   Neurological: Positive for weakness.  Hematological: Negative.   Psychiatric/Behavioral: Negative.   All other systems reviewed and are negative.   BP 160/72  Pulse 54  Ht 5\' 7"  (1.702 m)  Wt 228 lb (103.42 kg)  BMI 35.71 kg/m2   Physical Exam  Nursing note and vitals reviewed. Constitutional: He is oriented to person, place, and time. He appears well-developed and well-nourished.  HENT:  Head: Normocephalic.  Nose: Nose normal.  Mouth/Throat: Oropharynx is clear and moist.  Eyes: Conjunctivae are normal. Pupils are equal, round, and reactive  to light.  Neck: Normal range of motion. Neck supple. No JVD present.  Cardiovascular: Normal rate, regular rhythm, S1 normal, S2 normal and intact distal pulses.  Exam reveals no gallop and no friction rub.   Murmur heard.  Systolic murmur is present with a grade of 2/6       2+ lower extremity edema to the knees bilaterally  Pulmonary/Chest: Effort normal and breath sounds normal. No respiratory distress. He has no wheezes. He has no rales. He exhibits no tenderness.  Abdominal: Soft. Bowel sounds are normal. He exhibits no distension. There is no tenderness.  Musculoskeletal: Normal range of motion. He exhibits edema. He exhibits no tenderness.  Lymphadenopathy:    He has no cervical adenopathy.  Neurological: He is alert and oriented to person, place, and time. Coordination normal.  Skin: Skin is warm and dry. No rash noted. No erythema.  Psychiatric: He has a normal mood and affect. His behavior is normal. Judgment and thought content normal.           Assessment and Plan

## 2010-06-02 NOTE — Assessment & Plan Note (Signed)
Currently with no symptoms of angina. No further workup at this time. Continue current medication regimen. 

## 2010-06-02 NOTE — Patient Instructions (Addendum)
You are doing well. No medication changes were made. Please call us if you have new issues that need to be addressed before your next appt.   Your physician has requested that you have an echocardiogram. Echocardiography is a painless test that uses sound waves to create images of your heart. It provides your doctor with information about the size and shape of your heart and how well your heart's chambers and valves are working. This procedure takes approximately one hour. There are no restrictions for this procedure.  Your physician has requested that you have a lower extremity arterial exercise duplex. During this test, exercise and ultrasound are used to evaluate arterial blood flow in the legs. Allow one hour for this exam. There are no restrictions or special instructions.  Your physician recommends that you schedule a follow-up appointment in: 1 month

## 2010-06-14 NOTE — Discharge Summary (Signed)
NAME:  [name], [name] NO.:  [id number]   MEDICAL RECORD NO.:  [id number]          PATIENT TYPE:  INP   LOCATION:  2035                         FACILITY:  [facility]   PHYSICIAN:  Everardo Beals. Juanda Chance, MD, FACCDATE OF BIRTH:  03-19-1931   DATE OF ADMISSION:  09/02/2007  DATE OF DISCHARGE:  09/06/2007                               DISCHARGE SUMMARY   PRIMARY CARDIOLOGIST:  Everardo Beals. Juanda Chance, MD, [facility]   PRIMARY CARE Verlan Grotz:  Karie Schwalbe, M.D.   GASTROENTEROLOGIST:  Hedwig Morton. Juanda Chance, M.D.   DISCHARGE DIAGNOSIS:  Chest pain.   SECONDARY DIAGNOSES:  1. Coronary artery disease, status post coronary artery bypass graft      x4 in January 2002, with subsequent stenting of the right coronary      artery in July 2005, and stenting of the left anterior descending      in January 2007.  2. Hypertension.  3. Hyperlipidemia.  4. Type 2 diabetes mellitus.  5. Benign prostatic hypertrophy.  6. History of atrial flutter, status post radiofrequency ablation,      June 2001, on chronic Coumadin.  7. Peripheral vascular disease/claudication.  8. Chronic lower extremity edema, right greater than left.  9. Chronic renal insufficiency.  10.History of elevated liver function tests.  11.Chronic diastolic congestive heart failure.   ALLERGIES:  SULFA.   PROCEDURE:  Left heart cardiac catheterization.   HISTORY OF PRESENT ILLNESS:  A 76 year old African American male with  prior history of CAD, status post CABG, who was in his usual state of  health until August 30, 2007, when he began to experience exertional  dyspnea and diaphoresis, and this progressed to including substernal  chest pressure and tightness.  He had a particularly bad episode of  discomfort on September 02, 2007, prompting him to present to the [facility]  ED.  In the ED, he was treated nitroglycerin, with relief of discomfort.  ECG showed no acute ST or T changes, and the patient was admitted for  further evaluation and  rule-out.   HOSPITAL COURSE:  The patient ruled out for MI.  Given the similarity of  his current symptoms to prior angina, it was felt that he would require  cardiac catheterization.  Coumadin therapy was held, and he was treated  with oral vitamin K on September 04, 2007.  His  INR came down to 1.6 on  September 05, 2007, and left heart cardiac catheterization was performed,  revealing multivessel disease, with a patent LIMA to the LAD, patent  vein graft to the distal RCA, and patent vein graft to the obtuse  marginal and posterolateral.  Previously placed stents in the LIMA to  the LAD and right coronary artery were widely patent.  It was felt that  the patient would benefit from continued medical therapy.  This morning,  he did complain of some right foot pain.  There was no swelling, heat,  or erythema noted.  We have given him a prescription for Voltaren 75 mg  b.i.d. for the next week for presumed arthritic flare.  [name] is  being discharged home  today in good condition.   DISCHARGE LABORATORIES:  Hemoglobin 12.6, hematocrit 37.9, WBC 5.4,  platelets 112, MCV 89.4.  Sodium 141, potassium 4.5, chloride 109, CO2  24, BUN 10, creatinine 1.31, glucose 149.  INR 1.6.  Total bilirubin  0.4, alkaline phosphatase 86, AST 56, ALT 61, albumin 3.5.  Cardiac  markers negative x3.  Calcium 8.1.  Free T4 1.07, TSH 5.447, free T3  2.9.   DISPOSITION:  The patient is being discharged home today in good  condition.   FOLLOW-UP PLANS AND APPOINTMENT:  We have arranged for followup with Dr.  Charlies Constable on October 09, 2007 at 2:15 a.m.Marland Kitchen  At that point, we will  arrange for GI followup with Dr. Lina Sar, given his history of  elevated LFTs and amylase and lipase.  We have asked him to follow up  with Dr. Alphonsus Sias for Coumadin evaluation with the next week.   DISCHARGE MEDICATIONS:  1. Imdur 60 mg daily.  2. Lopressor 100 mg b.i.d.  3. K-Dur 20 mEq b.i.d.  4. Plavix 75 mg daily.  5. Flomax  0.4 mg daily.  6. Metformin 1000 mg b.i.d., to be resumed September 08, 2007.  7. Amiodarone 200 mg daily.  8. Norvasc 5 mg daily.  9. Lisinopril 40 mg daily.  10.Multivitamin 1 daily.  11.Aspirin 81 mg daily.  12.Coumadin as previously prescribed.  13.Lasix 80 mg b.i.d.  14.Glipizide 10 mg daily.  15.Flonase 2 sprays each nostril daily.  16.Nitroglycerin 0.4 mg sublingually p.r.n. chest pain.  17.Voltaren 75 mg b.i.d. x1 week.   OUTSTANDING LABORATORY STUDIES:  None.   DURATION OF DISCHARGE ENCOUNTER:  45 minutes, including physician time.      Nicolasa Ducking, ANP      Bruce R. Juanda Chance, MD, [facility]  Electronically Signed    CB/MEDQ  D:  09/06/2007  T:  09/06/2007  Job:  [id number]   cc:   Karie Schwalbe, MD

## 2010-06-14 NOTE — Discharge Summary (Signed)
NAME:  [name], [name]              ACCOUNT NO.:  [id number]   MEDICAL RECORD NO.:  [id number]          PATIENT TYPE:  INP   LOCATION:  3730                         FACILITY:  [facility]   PHYSICIAN:  Everardo Beals. Juanda Chance, MD, FACCDATE OF BIRTH:  1931-12-03   DATE OF ADMISSION:  08/22/2008  DATE OF DISCHARGE:  08/24/2008                               DISCHARGE SUMMARY   PRIMARY CARDIOLOGIST:  Everardo Beals. Juanda Chance, MD, [facility]   PRIMARY CARE PHYSICIAN:  Karie Schwalbe, MD   DISCHARGE DIAGNOSES:  1. Noncardiac, right arm, shoulder, chest pain.  2. Anemia.      a.     Likely secondary to chronic kidney disease (iron 46, total       iron-binding capacity 291, percent saturation 16, unsaturated iron-       binding capacity 245).   SECONDARY DIAGNOSES:  1. Coronary artery disease.      a.     Status post percutaneous coronary intervention of the left       circumflex and left anterior descending in 1990.      b.     Status post coronary artery bypass graft in January 2005       with left internal mammary artery to the left anterior descending,       saphenous vein graft to obtuse marginal and obtuse marginal 3, and       saphenous vein graft to the right coronary artery.      c.     Subsequently underwent percutaneous coronary intervention to       the right coronary artery on July 31, 2003.      d.     Heart catheterization in January 2007, severe stenosis of       the ostium of the left internal mammary artery with successful       percutaneous coronary intervention.      e.     Last cardiac catheterization September 05, 2007, revealed severe       native coronary artery disease with total occlusion of left       anterior descending, circumflex, and right coronary artery vessel.       The vein graft to the distal right coronary artery was patent with       less than 10% narrowing within the previously placed stents and       the mid distal portion of the graft, 50% ostial stenosis of the       right  coronary artery was noted.  The saphenous vein graft to the       obtuse marginal was patent with 40% stenosis of the distal end of       the graft and 50% narrowing of the distal end of the anastomosis.  Left internal mammary artery graft to the left anterior descending was  patent with 40% in-stent restenosis of the ostium of the graft with  total occlusion of the distal right coronary artery.  The left  ventricular ejection fraction was normal at that time.  1. Hypertension.  2. Diabetes.  3. Hyperlipidemia.  4. Benign prostatic  hypertrophy.  5. Paroxysmal atrial fibrillation, currently anticoagulated on      Coumadin.  6. History of typical atrial flutter, status post cavotricuspid      isthmus ablation in 2001.  7. Deep venous thrombosis with claudication.  8. Chronic lower extremity edema.  9. Chronic renal insufficiency.  10.History of elevated liver function test.  11.Chronic diastolic dysfunction.   ALLERGIES:  SULFA (causes rash).   PROCEDURES:  1. EKG, sinus rhythm at 60 bmp, nonspecific ST-T wave changes which      are unchanged from prior EKG completed in August 2009.  2. Chest x-ray, no acute airspace disease.  3. EKG completed on August 24, 2008, no significant changes from prior      tracing.   HISTORY OF PRESENT ILLNESS:  [name] is a 77 year old American male  with a known history of CAD (please see above), diabetes mellitus,  hypertension, and PAF; admitted for right arm, shoulder, and chest  discomfort.  The patient reports being in his usual state of health  until 05-Sep-2008.  His father-in-law died the previous week, and the  patient has been very active around the home trying to make improvement  in and out if the house.  He has developed a right arm, shoulder, and  right-sided chest discomfort since then.  He describes this discomfort  as sharp, and worse with movement of his right arm and shoulder.  He  also notes that with deep inspiration, he has  precordial chest  discomfort.  The patient feels that his chest discomfort is very  different from his prior angina.  He notes mild shortness of breath with  moderate activity also.  He denies palpitations, presyncope, syncope,  nausea, vomiting, worsening lower extremity edema, jaw pain, left arm  pain, or any other new symptoms.  Presently, he is resting comfortably  without complaints.  However, he notes with movement his right arm and  chest discomfort will return.  The patient took nitroglycerin at home  with no improvement in symptoms.   In the ED, he received morphine with significant improvement in his  arm/chest discomfort.   HOSPITAL COURSE:  The patient was admitted and cardiac enzymes were  cycled.  Point-of-care markers were negative x2 and 3 full sets of  cardiac enzymes were also negative.  D-dimer was negative and the  patient's symptoms resolved in the morning of August 24, 2008.  The  patient's hospital stay was unremarkable except for a minimal  bradycardia of heart rate of 52 (beta-blocker was held).  The patient  was also noted to have mild anemia and stools were checked.  They were  guaiac negative.  Anemia panel was completed, iron 46, total iron-  binding capacity 291, percent saturation is 16 (L), UIBC is 245.  Likely  anemia secondary to CKD.  Also, the patient was noted to have  intermittent hypertension and his BP meds have been slightly modified  with lisinopril decreased from 40 mg to 20 mg p.o. daily and the  addition of Norvasc 10 mg p.o. daily.  At the time of discharge, the  patient will receive his new medication list, prescriptions, and  followup instructions.   DISCHARGE LABORATORY DATA:  WBC 5.4, HGB 11.6. HCT 34.2, and PLT count  150.  Protime 30.9, INR 3.8, D-dimer 0.32.  Sodium 139, potassium 4.3,  chloride 105, CO2 28, BUN 25, creatinine 2.11, glucose 64, and calcium  8.9.  Total cholesterol 168, triglycerides 117, LDL 105, HDL 30,  VLDL  23.   Urinalysis within normal limits.  Urine glucose was 100.   FOLLOWUP PLANS AND APPOINTMENTS:  1. Dr. Karie Schwalbe, 1-2 weeks, the patient to schedule.  2. Dr. Charlies Constable, October 26, 2008, at 7:15 a.m.   DISCHARGE MEDICATIONS:  1. Amlodipine 10 mg p.o. daily.  2. Lisinopril 20 mg p.o. daily.  3. Amiodarone 200 mg p.o. daily.  4. Enteric-coated aspirin 81 mg p.o. daily.  5. Clopidogrel 75 mg p.o. daily.  6. Flomax 0.4 mg p.o. daily.  7. Fluticasone propionate nasal spray, 2 sprays daily.  8. Furosemide 80 mg p.o. b.i.d.  9. Glipizide 10 mg p.o. daily.  10.Imdur SR 30 mg p.o. daily.  11.Metformin 1 g p.o. b.i.d.  12.Metoprolol 100 mg p.o. b.i.d.  13.Multivitamin p.o. daily.  14.Nitroglycerin 0.4 mg sublingual p.r.n. for chest pain.  15.Potassium chloride 20 mEq p.o. b.i.d.  16.Pravastatin 40 mg p.o. daily.  17.Prilosec 40 mg p.o. daily.  18.Tramadol 50 mg p.o. b.i.d.  19.Tylenol 500 mg p.o. q.6 h. p.r.n.  20.Warfarin 5 mg p.o. daily.   Duration of discharge encounter including physician time was 45 minutes.      Jarrett Ables, PAC      Bruce R. Juanda Chance, MD, [facility]  Electronically Signed    MS/MEDQ  D:  08/24/2008  T:  08/25/2008  Job:  [id number]   cc:   Karie Schwalbe, MD  Everardo Beals Juanda Chance, MD, [facility]

## 2010-06-14 NOTE — Assessment & Plan Note (Signed)
[facility]                            CARDIOLOGY OFFICE NOTE   NAME:[name], [name]                       MRN:          [id number]  DATE:08/30/2007                            DOB:          [birth year]    PRIMARY CARE PHYSICIAN:  Karie Schwalbe, MD   CLINICAL HISTORY:  [name] is a 77 year old and returns for followup  management of coronary heart disease.  He had bypass surgery in January  2007 and he has had a bare metal stent to the ostium of the internal  mammary artery to LAD since that time.  He has good LV function.  Also,  he has history of diastolic heart failure.  He also had atrial flutter  ablation.  He has had recurrent atrial fibrillation, for which she is on  Coumadin and amiodarone.   He had been doing fairly well from the standpoint of his heart with only  slight swelling and no chest pain or palpitations.  He does get short of  breath with exertion and is chronic.   His chief complaint today is right upper quadrant pain and tenderness.  He has a history of abnormal liver function tests perhaps related  possibly to Crestor and he is not on a statin now.  He has been told he  had a fatty liver in the past.   PAST MEDICAL HISTORY:  Significant for diabetes, hyperlipidemia, renal  insufficiency, and hypertension.   CURRENT MEDICATIONS:  Metoprolol, K-Dur, Imdur, Plavix, Flomax,  metformin, amiodarone, amlodipine, lisinopril, multivitamins, aspirin,  Coumadin, furosemide, and glipizide.   PHYSICAL EXAMINATION:  VITALS SIGNS:  Blood pressure 150/66 and pulse  57.  His weight was 239, which is down.  NECK:  There was no venous distention.  Carotids pulses were full.  There are no bruits.  CHEST:  Clear.  HEART:  Rhythm is regular.  I can hear no murmurs or gallops.  ABDOMEN:  Soft.  Liver can be felt at lower costal edge and there was  tenderness in this region.  There were no hepatosplenomegaly.  EXTREMITIES:  Trace edema.   Pedal pulses were equal.   Electrocardiogram was normal.   IMPRESSION:  1. Coronary artery disease, status post coronary bypass graft surgery      in 2007 and status post stenting of the ostium of the internal      mammary graft with a bare-metal stent, stable.  2. Diastolic heart failure, now compensated.  3. A good left ventricular systolic function.  4. Status post atrial flutter ablation with recurrent atrial      fibrillation, managed with Coumadin and amiodarone.  5. Right upper quadrant pain and history of abnormal liver function      test possibly related to Crestor, possibly related to fatty liver.  6. Diabetes.  7. Hyperlipidemia.  8. Renal insufficiency.  9. Hypertension.   RECOMMENDATIONS:  I think Mr. Dyar is doing fairly well from the  standpoint of his heart.  I will plan to evaluate his right upper  quadrant pain with the liver function tests, lipase and amylase, and  also get  lipid profile at the same time.  We will get an ultrasound of  the gallbladder and pancreas.  If any of these are abnormal, then we  will refer him to Dr. Alphonsus Sias or Dr. Juanda Chance for followup.  I will plan to  see him back for cardiac follow up in a year.     Bruce Elvera Lennox Juanda Chance, MD, [facility]  Electronically Signed    BRB/MedQ  DD: 08/30/2007  DT: 08/31/2007  Job #: [id number]

## 2010-06-14 NOTE — Assessment & Plan Note (Signed)
[facility]                            CARDIOLOGY OFFICE NOTE   NAME:[name], [name]                       MRN:          [id number]  DATE:02/13/2007                            DOB:          [birth year]    CLINICAL HISTORY:  Mr. Bebout is 77 years old  and returns for follow-up  management of his coronary heart disease.  Had he has had remote bypass  surgery and in January 2007 had had a bare metal stent to the ostium of  the internal mammary artery to the LAD.  He has good LV function. Has  had a history of diastolic heart failure.  He also has had atrial  flutter ablation with recurrent atrial fibrillation and is on Coumadin  and amiodarone for this.   He says he has had been having increasing difficulty with shortness of  breath over the past few months.  He also has had increased edema of the  lower extremities.  Had no chest pain.   PAST MEDICAL HISTORY:  1. Significant for diabetes.  2. Hyperlipidemia.  3. Renal insufficiency.  4. Hypertension.   CURRENT MEDICATIONS:  Include metoprolol, glipizide, Zetia, K-Dur,  Imdur, Plavix, Flomax, furosemide, metformin, amiodarone, amlodipine,  Lisinopril, aspirin and Coumadin.   REVIEW OF SYSTEMS:  Is positive for sinus congestion and drainage.   PHYSICAL EXAMINATION:  Today blood pressure is 163/74.  The pulse 54 and  regular.  There was no venous distension.  The carotid pulses were visible on 1 cm  of the clavicle.  The carotid pulses were full.  There are no bruits.  CHEST:  Was clear.  The cardiac rhythm was regular.  I hear no murmurs or gallops.  The abdomen was soft with normal bowel sounds.  There is no  hepatosplenomegaly.  There was 1-2+ edema of lower extremity; pedal pulses were equal.   Electrocardiogram showed sinus bradycardia and was otherwise normal.   IMPRESSION:  1. Shortness of breath with exertion.  Probably related to diastolic      heart failure with some volume  overload.  2. Coronary artery disease status post coronary bypass graft surgery      and status post percutaneous coronary intervention to the left      internal mammary artery graft with a bare metal stent January 2007.  3. Good left ventricular systolic function.  4. Status post atrial flutter ablation with recurrent atrial      fibrillation, on Coumadin and amiodarone.  5. Venous insufficiency of the lower extremities.  6. Diabetes.  7. Hyperlipidemia.  8. Renal insufficiency.  9. Hypertension.   RECOMMENDATIONS:  I think Mr. Gripp has symptoms of shortness of breath  and has some volume overload, probably related to diastolic heart  failure.  Will plan to increase his Lasix from a 80 mg in the morning  and 40 mg in the afternoon to 80 mg twice a day.  Will plan to get a BMP  on him next week.  I also start him on a Z-Pak for what I think is  sinusitis.  He also was taking some  nasal decongestant. Will plan to see  him back for cardiac follow-up in 6 months.     Bruce Elvera Lennox Juanda Chance, MD, [facility]  Electronically Signed    BRB/MedQ  DD: 02/13/2007  DT: 02/13/2007  Job #: [id number]

## 2010-06-14 NOTE — H&P (Signed)
NAME:  [name], KORNEGAY NO.:  [id number]   MEDICAL RECORD NO.:  [id number]          PATIENT TYPE:  INP   LOCATION:  3730                         FACILITY:  [facility]   PHYSICIAN:  Hillis Range, MD       DATE OF BIRTH:  1931/06/01   DATE OF ADMISSION:  08/22/2008  DATE OF DISCHARGE:                              HISTORY & PHYSICAL   CHIEF COMPLAINT:  Right arm, shoulder, and chest pain.   HISTORY OF PRESENT ILLNESS:  Mr. [name] is a pleasant 77 year old  gentleman with a history of coronary artery disease status post prior  CABG in 2002 and subsequent percutaneous coronary intervention of the  right coronary artery in 2005 with diabetes, hypertension, and  paroxysmal atrial fibrillation, who was admitted with right arm,  shoulder, and chest discomfort.  The patient reports being in his usual  state of health until Monday of this past week.  His father-in-law died  last week and the patient has been very active around the home trying to  make improvement to the home and yard.  He notes being very active this  week and doing multiple chores.  He has developed right arm, shoulder,  and right-sided chest discomfort.  He describes this discomfort as sharp  and worse with movement of his right arm and shoulder.  He also notes  that with deep inspiration that he has precordial chest discomfort.  The  patient feels that his chest discomfort is very different than his prior  anginal equivalent.  He has noted mild shortness of breath with moderate  activities.  He denies palpitations, presyncope, syncope, nausea,  vomiting, worsening lower extremity edema, jaw pain, left arm pain, or  other symptoms.  Presently, he is resting comfortably without complaint.  He notes that with movement of his right arm his chest and arm  discomfort return.   He has tried nitroglycerin for his pain at home without any improvement.  In the emergency room, he received morphine with significant  improvement  in his arm and chest discomfort.   PAST MEDICAL HISTORY:  1. Coronary artery disease status post percutaneous intervention of      the left circumflex and LAD in the 1990s.  He then underwent CABG      in January 2005 with a LIMA to the LAD, SVG to OM and OM 3, and SVG      to the RCA.  He subsequently underwent percutaneous coronary      intervention to the right coronary artery on July 31, 2003.  In      January 2007, he returned with chest discomfort and had a heart      catheterization which revealed severe stenosis at the ostium of the      LIMA which was successfully stented at that time.  His most recent      left heart catheterization was September 05, 2007, and revealed severe      nail for coronary artery disease with total occlusion of the LAD,      circumflex, and RCA vessels.  The vein graft to the distal  RCA was      patent with less than 10% narrowing within the previously placed      stent and in the distal portion of the graft.  A 50% ostial      stenosis of the RCA was noted.  The SVG to OM was patent with 40%      stenosis in the distal limb of the graft and 50% narrowing at the      distal anastomosis.  The LIMA graft to LAD was patent with 40% in-      stent restenosis at the ostium of the graft with total occlusion of      the distal RCA.  The left ventricular ejection fraction was normal      at that time.  2. Hypertension.  3. Diabetes.  4. Hyperlipidemia.  5. Benign prostatic hypertrophy.  6. Paroxysmal atrial fibrillation, chronically anticoagulated with      Coumadin.  7. History of typical atrial flutter status post cavotricuspid isthmus      ablation in 2001.  8. Peripheral vascular disease with claudication.  9. Chronic lower extremity edema.  10.Chronic renal insufficiency.  11.History of elevated LFTs.  12.Chronic diastolic dysfunction.   ALLERGIES:  SULFA causes rash.   HOME MEDICATIONS:  1. Metoprolol 100 mg b.i.d.  2. Glipizide 10 mg  daily.  3. Potassium chloride 20 mEq b.i.d.  4. Coumadin 5 mg daily.  5. Isosorbide dinitrate 30 mg daily.  6. Plavix 75 mg daily.  7. Aspirin 81 mg daily.  8. Lasix 80 mg b.i.d.  9. Metformin 1000 mg b.i.d.  10.Amiodarone 200 mg daily.  11.Lisinopril 40 mg daily.  12.Multivitamin daily.  13.Ultram 1-2 tablets p.r.n.  14.Fluticasone 50 mcg b.i.d.  15.Prilosec 20 mg daily.  16.Pravastatin 40 mg daily.  17.Flomax 0.4 mg daily.   SOCIAL HISTORY:  The patient lives with his spouse in [place].  He is  retired.  He has a history of chewing tobacco, but never smoked.  He has  a prior history of heavy alcohol consumption, but quit many years ago.  He denies drug use.   FAMILY HISTORY:  Notable for diabetes, cerebrovascular disease, and  renal disease.   REVIEW OF SYSTEMS:  All systems are reviewed and negative except as  outlined in the HPI above.   PHYSICAL EXAMINATION:  VITAL SIGNS: Blood pressure 140/58, heart rate  55, respirations 15, sats 100% on room air, and afebrile.  GENERAL:  The patient is a chronically ill, morbidly obese gentleman, in  no acute distress.  He is alert and oriented x3.  HEENT:  Normocephalic and atraumatic.  Sclerae are clear.  Conjunctivae  are pink.  Oropharynx is clear.  NECK:  Supple.  No thyromegaly, JVD, or bruits.  LUNGS:  Clear to auscultation bilaterally.  HEART:  Regular rate and rhythm.  No murmurs, rubs, or gallops.  GI:  Obese, soft, nontender, and nondistended.  Positive bowel sounds.  EXTREMITIES:  No clubbing or cyanosis, 2+ right lower extremity edema  and 1+ left lower extremity edema chronically.  SKIN:  No ecchymoses or lacerations.  MUSCULOSKELETAL:  The patient has marked tenderness to palpation over  the deltoid region of the right arm which reproduces his chest  discomfort.  He also is tender across the precordium and particularly  over the right deltoid and which also reproduces his chest discomfort.  NEURO:  Strength and  sensation are intact.  PSYCH:  Euthymic affect.   EKG today reveals sinus rhythm at 60  beats per minute with nonspecific  ST/T-wave changes which are unchanged from a prior EKG from September 03, 2007.   Chest x-ray, I personally reviewed the patient's chest x-ray today which  reveals no acute airspace disease.   LABORATORY DATA:  Troponin less than 0.5.  CK-MB 1.1.  INR 3.3.  D-dimer  0.32.  Hematocrit 35.  Potassium 4.2, glucose 117, creatinine less than  0.2.  White blood cell count 5.2, hemoglobin 11.8, and platelets 161.   IMPRESSION:  Mr. Mccardle is a very pleasant 77 year old gentleman who is  admitted for further evaluation and management of atypical chest  discomfort.  Though his symptoms seem atypical and are likely  musculoskeletal in their origin, he does have a very significant history  of coronary artery disease with multiple risk factors.  I think that it  would be prudent to admit the patient to telemetry and follow with  serial cardiac markers overnight.  If he rules out for myocardial  infarction and his symptoms of right-sided arm, shoulder, and chest  discomfort improved, then he could likely be discharged without further  cardiac risk stratification.  However, if he rules in for myocardial  infarction or continues to have worsening pain, then further cardiac  studies may be warranted.  We will continue the patient's home medicine  regimen at this time.  I have placed the patient on oxycodone for pain.  I will hold his Coumadin as he has a supratherapeutic INR at this time.  The patient's blood pressure is currently elevated and we will follow  this closely during hospital stay and consider increasing his  antihypertensive regimen if his blood pressure does not improve.  We  will also follow his glycemic control throughout his hospital stay.      Hillis Range, MD  Electronically Signed     JA/MEDQ  D:  08/22/2008  T:  08/23/2008  Job:  [id number]   cc:   Everardo Beals. Juanda Chance, MD, [facility]

## 2010-06-14 NOTE — Discharge Summary (Signed)
NAME:  ISACK, LAVALLEY NO.:  [id number]   MEDICAL RECORD NO.:  [id number]          PATIENT TYPE:  INP   LOCATION:  2035                         FACILITY:  [facility]   PHYSICIAN:  Everardo Beals. Juanda Chance, MD, FACCDATE OF BIRTH:  [birth year]   DATE OF ADMISSION:  09/02/2007  DATE OF DISCHARGE:  09/06/2007                               DISCHARGE SUMMARY   ADDENDUM  Previously dictated discharge summary job [id number].   Please remove Norvasc from the previously dictated discharge medication  list.  The patient had previous intolerance to Norvasc secondary to  lower extremity edema and had been taken off of it a year ago.      Nicolasa Ducking, ANP      Bruce R. Juanda Chance, MD, [facility]  Electronically Signed    CB/MEDQ  D:  09/06/2007  T:  09/07/2007  Job:  [id number]

## 2010-06-14 NOTE — Cardiovascular Report (Signed)
NAME:  JAXDEN, BLYDEN NO.:  [id number]   MEDICAL RECORD NO.:  [id number]          PATIENT TYPE:  INP   LOCATION:  2035                         FACILITY:  [facility]   PHYSICIAN:  Everardo Beals. Juanda Chance, MD, FACCDATE OF BIRTH:  April 30, 1931   DATE OF PROCEDURE:  09/05/2007  DATE OF DISCHARGE:                            CARDIAC CATHETERIZATION   CLINICAL HISTORY:  Mr. Kropf is 77 year old who has had remote bypass  surgery.  He has had a stent placed in the distal portion of the vein  graft to the right coronary artery and has had a bare metal stent placed  at the ostium of the LIMA graft to the LAD.  He was admitted with  recurrent chest pain suggestive of unstable angina.   PROCEDURE:  The procedure was performed with the right femoral artery  and arterial sheath and 5-French preformed coronary catheters.  A front  wall arterial puncture was performed, and Omnipaque contrast was used.  A LIMA catheter used for injection of the LIMA graft.  The patient  tolerated the procedure well and left laboratory in satisfactory  condition.   RESULTS:  The left main coronary artery:  The left main coronary artery  was patent and was free of disease.   Left anterior descending artery:  The left anterior descending artery  was completely occluded in its origin.   The circumflex artery:  The circumflex artery was completely occluded in  its origin.   The right coronary artery:  The right coronary artery was completely  occluded in its origin.   The saphenous vein graft to the marginal and posterolateral branch to  the circumflex artery was patent.  There was 40% narrowing in the  midportion and distal limb and there was 50% narrowing at the distal  anastomosis of the posterolateral branch.   The saphenous vein graft to the distal right coronary artery was patent  and functioned normally.  The stent, which was located in the distal  portion of the graft and extended into the distal  right coronary artery,  had less than 10% narrowing.  The posterior descending branch had a 50%  ostial lesion, which was jailed by the stent.   The LIMA graft to the LAD was patent.  There was 40% narrowing within  the stent in the ostium of the LIMA graft.  The LAD was occluded near  its distal portion.  This appeared to represent some progression from a  subtotal occlusion previously.   The left ventriculogram performed in the RAO projection showed good wall  motion with no areas of hypokinesis.  The estimated ejection fraction  was 60%.   CONCLUSION:  1. Coronary artery disease, status post prior coronary artery bypass      graft surgery.  2. Severe native vessel disease with total occlusion of the left      anterior descending artery, circumflex artery, and right coronary      artery.  3. Patent vein graft to the distal right coronary artery with less      than 10% narrowing in the stent within the distal portion of  the      graft and 50% ostial stenosis of the posterior descending branch of      the right coronary artery, patent sequential vein graft to the      marginal posterolateral branch of the circumflex artery with 40%      narrowing in the distal limb of the graft and 50% narrowing at the      distal anastomosis, and a patent LIMA graft to LAD with 40% in-      stent restenosis at the ostium of the graft with total occlusion of      the distal LAD.  4. Normal LV function.   RECOMMENDATIONS:  The patient's major source of ischemia at the totally  occluded distal LAD, which appears to have progressed slightly from the  previous study.  We will plan continued medical management.      Bruce Elvera Lennox Juanda Chance, MD, [facility]  Electronically Signed     BRB/MEDQ  D:  09/05/2007  T:  09/06/2007  Job:  [id number]   cc:   Karie Schwalbe, MD

## 2010-06-14 NOTE — H&P (Signed)
NAME:  [name], [name] NO.:  [id number]   MEDICAL RECORD NO.:  [id number]          PATIENT TYPE:  INP   LOCATION:  2035                         FACILITY:  [facility]   PHYSICIAN:  Luis Abed, MD, FACCDATE OF BIRTH:  06/01/1931   DATE OF ADMISSION:  09/02/2007  DATE OF DISCHARGE:                              HISTORY & PHYSICAL   PRIMARY CARDIOLOGIST:  Everardo Beals. Juanda Chance, MD, [facility].   PRIMARY CARE Jeda Pardue:  Karie Schwalbe, MD.   GI:  Hedwig Morton. Juanda Chance, MD.   PATIENT PROFILE:  A 77 year old African American male with prior history  of CAD and CABG who presents with unstable angina.   PROBLEMS:  1. Unstable angina/coronary artery disease.      a.     Percutaneous coronary intervention of the left circumflex       and left anterior descending in the 1990s.      b.     February 24, 2000, status post coronary artery bypass graft       x4 with a left internal mammary artery to the left anterior       descending, sequential vein graft to the obtuse marginal 2 and       obtuse marginal 3, and vein graft to the right coronary artery.      c.     July 31, 2003, percutaneous coronary intervention and       stenting of the right coronary artery with a 2.5 x 15 mm ZoMaxx       study stent.      d.     February 16, 2005, cardiac catheterization/percutaneous       coronary intervention.  Left main normal, left anterior descending       100% proximal, 99% at the apex, left internal mammary artery to       the left anterior descending 70% ostial stenosis, treated with a       3.0 x 12 mm Vision bare-metal stent.  Left circumflex 100%       proximal, vein graft to the second and third obtuse marginal, 40%       proximal.  Right coronary artery 100% proximal, vein graft to the       right coronary artery patent.  Distal right coronary artery stent       was widely patent.  Posterior descending artery 80% ostial       stenosis secondary to jailing from stent.  2. Hypertension.  3.  Hyperlipidemia.  4. Type 2 diabetes mellitus.  5. Benign prostatic hypertrophy.  6. History of atrial flutter, status post radiofrequency ablation,      June 2001.  The patient is chronically on Coumadin.  7. Peripheral vascular disease/claudication.  8. Chronic lower extremity edema, right greater than left.  9. Chronic renal insufficiency.  10.History of elevated LFTs.  11.Chronic diastolic congestive heart failure.   HISTORY OF PRESENT ILLNESS:  A 77 year old [place] American male with  history of CAD, CABG, and multiple PCIs who was in his usual state of  health until August 30, 2007, when after seeing Dr.  Brodie in the office,  he began to note dyspnea and diaphoresis with usual activities.  Symptoms progressed over the weekend and included 5/10 substernal chest  pressure and tightness lasting about 5-10 minutes and resolved with rest  and nitro.  When he saw Dr. Juanda Chance, he had complained of some vague  abdominal discomfort and was set up for an abdominal ultrasound, which  was scheduled at [facility] today.  He came to [facility] for that  ultrasound, then afterwards noted chest pressure and tightness similar  to previous angina while walking.  This prompted him to present to the  [facility] ED.  Prior to arrival at the ED, he took 2 nitroglycerins  with complete relief of discomfort in 3-4 minutes.  He is currently pain  free.   ALLERGIES:  SULFA.   HOME MEDICATIONS:  1. Lopressor 100 mg b.i.d.  2. K-Dur 20 mEq b.i.d.  3. Imdur 30 mg daily.  4. Plavix 75 mg daily.  5. Flomax 0.4 mg daily.  6. Metformin 1000 mg b.i.d.  7. Amiodarone 200 mg daily.  8. Norvasc 2.5 mg daily.  9. Lisinopril 40 mg daily.  10.Multivitamin daily.  11.Aspirin 81 mg daily.  12.Coumadin as directed.  13.Lasix 80 mg b.i.d.  14.Glipizide 10 mg daily.  15.Fluticasone nasal inhaler 50 mcg 2 sprays each nostril.   FAMILY HISTORY:  Mother died of complications of diabetes and end-stage  renal  disease at age 76.  Father had a stroke and died of a stroke at  age in the 13s.  He had 8 sisters and 5 brothers.  There is a history of  MI and diabetes in his siblings.   SOCIAL HISTORY:  Lives in [place] with his wife.  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  He  previously chewed tobacco, but never smoked cigarettes.  He previously  used alcohol heavily, but quit this some time ago.  He denies any drug  use.  He walks daily.   REVIEW OF SYSTEMS:  Positive for chronic lower extremity edema, the  venous stasis changes, chest pain, shortness of breath, dyspnea on  exertion, edema, and diaphoresis outlined in the HPI.  He has chronic  constipation.  He has diabetes.  Otherwise, all systems reviewed are  negative.   PHYSICAL EXAMINATION:  VITAL SIGNS:  Temperature 98.2, heart rate 56,  respirations 22, blood pressure 181/82, and pulse ox 100% on room air.  GENERAL:  Pleasant African American male in no acute distress.  Awake,  alert, and oriented x3.  HEENT:  Normal.  SKIN:  Warm and dry without lesions or masses.  NECK:  No bruits or JVD.  LUNGS:  Respirations regular and labored.  Clear to auscultation.  CARDIAC:  Regular S1 and S2.  No S3, S4, or murmurs.  ABDOMEN:  Round, soft, nontender, and nondistended.  Bowel sounds  present x4.  EXTREMITIES:  Warm and dry with 2+ right lower extremity edema, 1+ left  extremity edema.  Dorsalis pedis and posterior tibial pulses 1+  bilaterally.  NEURO:  Grossly intact, nonfocal.   Abdominal ultrasound that was performed earlier today was normal.  Chest  x-ray shows slight prominent heart size and central pulmonary  vasculature without edema or infiltrate.  EKG shows sinus bradycardia  with a normal axis, T-wave inversion in aVL, rate of 57 beats per  minute.   LAB WORK:  Hemoglobin 12.0, hematocrit 36.2, WBC 5.4, and platelets  137.  All other lab work is pending.   ASSESSMENT/PLAN:  1.  Unstable angina/coronary artery disease.  Symptoms concerning and      similar to previous angina.  Plans were made to cycle cardiac      markers.  We will hold his Coumadin, initiate heparin once his INR      is less than 2.0.  Add IV nitroglycerin.  Continue aspirin, Plavix,      beta-blocker, and ACE inhibitor.  Cath and INR at an acceptable      value.  No statin secondary to history of elevated LFTs.  2. Hypertension.  Blood pressure is elevated here in the ED.  Resume      home meds and add IV nitroglycerin.  Adjust as necessary.  3. Hyperlipidemia.  Statin and Zetia have previously been held      secondary to elevated LFTs.  4. Diabetes mellitus.  Hold metformin.  5. Atrial flutter.  Continue amiodarone.  Hold Coumadin and plan to      bridge with heparin.  6. Chronic kidney disease.  Follow creatinine and hydrate prior to      cath.  7. Benign prostatic hypertrophy.  Continue Flomax.      Nicolasa Ducking, ANP      Luis Abed, MD, [facility]  Electronically Signed    CB/MEDQ  D:  09/02/2007  T:  09/03/2007  Job:  [id number]

## 2010-06-16 ENCOUNTER — Other Ambulatory Visit (INDEPENDENT_AMBULATORY_CARE_PROVIDER_SITE_OTHER): Payer: No Typology Code available for payment source | Admitting: *Deleted

## 2010-06-16 ENCOUNTER — Encounter (INDEPENDENT_AMBULATORY_CARE_PROVIDER_SITE_OTHER): Payer: No Typology Code available for payment source | Admitting: *Deleted

## 2010-06-16 DIAGNOSIS — R609 Edema, unspecified: Secondary | ICD-10-CM

## 2010-06-16 DIAGNOSIS — I7389 Other specified peripheral vascular diseases: Secondary | ICD-10-CM

## 2010-06-16 DIAGNOSIS — I739 Peripheral vascular disease, unspecified: Secondary | ICD-10-CM

## 2010-06-16 DIAGNOSIS — I5031 Acute diastolic (congestive) heart failure: Secondary | ICD-10-CM

## 2010-06-16 DIAGNOSIS — I70219 Atherosclerosis of native arteries of extremities with intermittent claudication, unspecified extremity: Secondary | ICD-10-CM

## 2010-06-16 DIAGNOSIS — I509 Heart failure, unspecified: Secondary | ICD-10-CM

## 2010-06-16 DIAGNOSIS — I2510 Atherosclerotic heart disease of native coronary artery without angina pectoris: Secondary | ICD-10-CM

## 2010-06-17 ENCOUNTER — Encounter: Payer: Self-pay | Admitting: Internal Medicine

## 2010-06-17 ENCOUNTER — Ambulatory Visit (INDEPENDENT_AMBULATORY_CARE_PROVIDER_SITE_OTHER): Payer: No Typology Code available for payment source | Admitting: Internal Medicine

## 2010-06-17 VITALS — BP 132/50 | HR 52 | Temp 97.8°F | Ht 70.0 in | Wt 233.0 lb

## 2010-06-17 DIAGNOSIS — N451 Epididymitis: Secondary | ICD-10-CM

## 2010-06-17 DIAGNOSIS — R1031 Right lower quadrant pain: Secondary | ICD-10-CM

## 2010-06-17 DIAGNOSIS — N453 Epididymo-orchitis: Secondary | ICD-10-CM

## 2010-06-17 MED ORDER — DOXYCYCLINE HYCLATE 100 MG PO TABS: 100.0000 mg | ORAL_TABLET | Freq: Two times a day (BID) | ORAL | Status: AC

## 2010-06-17 NOTE — Discharge Summary (Signed)
NAME:  [name], [name] NO.:  [id number]   MEDICAL RECORD NO.:  [id number]          PATIENT TYPE:  INP   LOCATION:  4709                         FACILITY:  [facility]   PHYSICIAN:  Charlies Constable, M.D. [facility] DATE OF BIRTH:  03/20/1934   DATE OF ADMISSION:  03/17/2005  DATE OF DISCHARGE:  03/21/2005                                 DISCHARGE SUMMARY   CARDIOLOGIST:  Rollene Rotunda, M.D.   DISCHARGING CARDIOLOGIST:  Dr. Juanda Chance.   DISCHARGE DIAGNOSES:  Atypical chest pain, most likely musculoskeletal chest  pain versus gastrointestinal discomfort.   SECONDARY DIAGNOSES:  1.  Renal insufficiency, elevated liver function tests.  2.  Right shoulder neck pain.  3.  Constipation.   PROCEDURES PERFORMED DURING THIS HOSPITALIZATION:  Gastrointestinal consult  on March 20, 2005.   ALLERGIES:  SULFA.   HISTORY OF PRESENT ILLNESS:  The patient is a 77 year old male with status  post bare metal stenting of his LIMA to his LAD on February 16, 2005, with a  residual 90% apical LAD.  The patient also has a 40% ostial in his SVG to OM-  1, OM-2, OM-3 with a 50% in his OM-3 and anastomosed patent skin to his RCA,  with a patent distal stent placed in a jailed 80% ostial PDA.  The patient  had presented on March 17, 2005, with some sharp recurrence, mid-sternal  chest pain in his right shoulder and in his arm and has had pain in his  right neck that was exacerbated by movement and palpation.  This pain was  different from his previous chest pain for his stent placement.  The patient  was admitted where cardiac enzymes were cycled and EKG obtained.  EKG showed  a sinus bradycardia with a rate of 56 and no ST changes.  His cardiac  enzymes were negative.  A D-dimer was also obtained, which was negative.  Chest x-ray was obtained, which showed some questionable small granuloma in  the left lung.  The patient did continue to complain of abdominal fullness  and discomfort.  LFTs were  elevated and unclear if these were new.  LFTs  were rechecked and at that point a GI consult was obtained.  LFTs were again  elevated.  Amiodarone was decreased to 200 mg p.o. q.d. for questionable  elevation of TSH to 11.19.  A GI consult was obtained, which again noted  that the patient had some questionable reflux disease and Protonix was again  recommended as this has helped in the past.  The patient also has a history  of chronic constipation and a history of chronic polyps per GI.  He did have  a mild elevation of his transaminase which GI felt was more related to his  amiodarone and possible hepatic liver disease, related to an echodensity on  his ultrasound of the liver in 12/06.  GI recommended that the patient be  put back on a proton pump inhibitor and followed as an outpatient.  The  patient will be followed up by Dr. Lina Sar on April 26, 2005, at 10 a.m.  He will also  see Dr. Charlies Constable on May 04, 2005, at 9:30 a.m.   DISCHARGE MEDICATIONS:  1.  Prilosec over-the-counter 30 minutes before and 2 hours after meals.  2.  Colace as needed for constipation.  3.  Zetia 10 mg daily.  4.  We will discontinue the Crestor, which elevated LFTs.  5.  Decrease amiodarone to 200 mg daily.  6.  Lasix 40 mg b.i.d.  7.  Norvasc 2.5 mg daily.  8.  Enteric-coated aspirin 325 mg daily.  9.  Plavix 75 mg daily.  10. Coumadin 7.5 mg daily, except on 10 mg Tuesdays and Thursdays.  11. Lopressor 100 mg b.i.d.  12. Imdur 30 mg daily.  13. Glipizide 10 mg daily.   DISCHARGE LABORATORY DATA:  Sodium 141, potassium 4.4, chloride 108, CO2 29,  glucose 227, BUN 17, creatinine 1.5.  AST 123, ALT 170.  Hemoglobin A1c was  8.4.  Cardiac markers were negative.  Lipid profile shows a total  cholesterol of 129, triglycerides 65, HDL 37, and an LDL of 79.   DURATION OF DISCHARGE ENCOUNTER:  Approximately 45 minutes including M.D.  and N.P. time.     ______________________________  April  Humphrey, NP    ______________________________  Charlies Constable, M.D. [facility]    AH/MEDQ  D:  04/27/2005  T:  04/27/2005  Job:  [id number]   cc:   Rollene Rotunda, M.D.  [address]  [address]  [place]  [place] [address]   Lina Sar, M.D. [facility]  [address]  [place]  [place] [address]

## 2010-06-17 NOTE — Progress Notes (Signed)
Subjective:    Patient ID: [name], male    DOB: [birth year], 77 y.o.   MRN: [id number]  HPI Has pain in right flank and into groin Noticed it 2 days ago Worse now than then No injury or new task  Slight dysuria No hematuria Long standing right leg pain and weakness Swells since vein harvest for CABG  Just had ultrasound on legs for Dr Mariah Milling  Sugars much better Off below 120 and mostly under 140  Current outpatient prescriptions:acetaminophen (TYLENOL) 500 MG tablet, Take 500 mg by mouth every 6 (six) hours as needed.  , Disp: , Rfl: ;  amiodarone (PACERONE) 200 MG tablet, Take 200 mg by mouth daily.  , Disp: , Rfl: ;  amLODipine (NORVASC) 10 MG tablet, Take 10 mg by mouth daily.  , Disp: , Rfl: ;  aspirin 81 MG tablet, Take 81 mg by mouth daily.  , Disp: , Rfl:  clopidogrel (PLAVIX) 75 MG tablet, Take 75 mg by mouth daily.  , Disp: , Rfl: ;  fluticasone (FLONASE) 50 MCG/ACT nasal spray, 2 sprays by Nasal route as needed.  , Disp: , Rfl: ;  furosemide (LASIX) 80 MG tablet, Take 80 mg by mouth 2 (two) times daily.  , Disp: , Rfl: ;  glipiZIDE (GLUCOTROL) 10 MG tablet, Take 10 mg by mouth daily.  , Disp: , Rfl:  glucose blood (BAYER CONTOUR TEST) test strip, Use as instructed, check blood sugar three times daily , Disp: , Rfl: ;  insulin glargine (LANTUS) 100 UNIT/ML injection, Inject 10 Units into the skin at bedtime.  , Disp: , Rfl: ;  Insulin Pen Needle (PEN NEEDLES 5/16") 30G X 8 MM MISC, As directed , Disp: , Rfl: ;  isosorbide mononitrate (IMDUR) 30 MG 24 hr tablet, Take 1 tablet (30 mg total) by mouth daily., Disp: 30 tablet, Rfl: 0 lisinopril (PRINIVIL,ZESTRIL) 20 MG tablet, Take 20 mg by mouth daily.  , Disp: , Rfl: ;  metoprolol (LOPRESSOR) 100 MG tablet, Take 100 mg by mouth 2 (two) times daily.  , Disp: , Rfl: ;  Multiple Vitamin (MULTIVITAMIN) tablet, Take 1 tablet by mouth daily.  , Disp: , Rfl: ;  nitroGLYCERIN (NITROSTAT) 0.4 MG SL tablet, Place 0.4 mg under the tongue  every 5 (five) minutes as needed.  , Disp: , Rfl:  omeprazole (PRILOSEC) 20 MG capsule, Take 20 mg by mouth daily.  , Disp: , Rfl: ;  potassium chloride SA (K-DUR,KLOR-CON) 20 MEQ tablet, Take 1 tablet (20 mEq total) by mouth 2 (two) times daily., Disp: 60 tablet, Rfl: 6;  pravastatin (PRAVACHOL) 40 MG tablet, Take 40 mg by mouth daily.  , Disp: , Rfl: ;  Tamsulosin HCl (FLOMAX) 0.4 MG CAPS, Take 0.4 mg by mouth.  , Disp: , Rfl:  traMADol (ULTRAM) 50 MG tablet, Take 1/2-1 tablet by mouth three times a day as needed , Disp: , Rfl: ;  warfarin (COUMADIN) 5 MG tablet, Take 5 mg by mouth daily.  , Disp: , Rfl:   Past Medical History  Diagnosis Date  . Allergy   . Anemia   . Diabetes mellitus   . GERD (gastroesophageal reflux disease)   . Hypertension   . Osteoarthritis   . Hyperlipidemia   . BPH (benign prostatic hyperplasia)   . Diastolic CHF, acute   . S/P ablation of atrial fibrillation   . CAD (coronary artery disease)   . Anxiety   . 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 Angioplasty     multiple   . Coronary artery bypass graft 01/2000  . Circumcision 12/2005    Family History  Problem Relation Age of Onset  . Diabetes Mother   . Heart disease Father   . Heart disease Sister   . Hypertension Sister   . Kidney disease Mother     ESRD  . Stroke Father 44    deceased from same    History   Social History  . Marital Status: Married    Spouse Name: N/A    Number of Children: 4  . Years of Education: N/A   Occupational History  . retired     disabled due to CAD   Social History Main Topics  . Smoking status: Never Smoker   . Smokeless tobacco: Former Neurosurgeon    Types: Chew    Quit date: 01/30/1985  . Alcohol Use: No  . Drug Use: No  . Sexually Active: Not on file   Other Topics Concern  . Not on file   Social History Narrative  . No narrative on file   Review of Systems No fever Slight lump at  umbilicus--?hernia Occ nausea Appetite is "fair"    Objective:   Physical Exam  Constitutional: He appears well-developed and well-nourished.  Abdominal: Soft. There is no tenderness. There is no rebound.  Genitourinary:       No hernia but right epididymis and testes are tender and palpation recreates his pain   Musculoskeletal:       Pain with passive internal and external rotation of right hip fari ROM though  Neurological:       Gait slow but not ataxic Normal full weight bearing          Assessment & Plan:

## 2010-06-17 NOTE — H&P (Signed)
NAME:  [name], [name] NO.:  [id number]   MEDICAL RECORD NO.:  [id number]          PATIENT TYPE:  EMS   LOCATION:  MAJO                         FACILITY:  [facility]   PHYSICIAN:  Jonelle Sidle, M.D. LHCDATE OF BIRTH:  03/20/1934   DATE OF ADMISSION:  02/14/2005  DATE OF DISCHARGE:                                HISTORY & PHYSICAL   PRIMARY CARDIOLOGIST:  Charlies Constable, M.D.   PRIMARY CARE PHYSICIAN:  Tillman Abide, M.D.   REASON FOR ADMISSION:  Recurrent chest pain.   HISTORY OF PRESENT ILLNESS:  [name] is a 77 year old male with a history  of hypertension, hyperlipidemia, type 2 diabetes mellitus, claudication,  atrial flutter on Coumadin, and coronary artery disease status post coronary  artery bypass grafting in 2000.  He is also status post placement of a Zomax  study stent in the distal right coronary artery via the vein graft and has  had followup angiography as part of a Zomax trial in March 2006 showing  patent grafts with a 50% ostial left internal mammary artery graft stenosis  that has been managed medically.  Most recently he had a Myoview study in  October 2006, which showed no evidence of ischemia.   [name] presents to the emergency department with a two day history of  intermittent chest discomfort.  He describes this as both blunt and sharp  and somewhat like indigestion.  He states that these symptoms are similar to  prior angina before his bypass surgery.  These symptoms radiate to the neck  and arms and have been associated with mild shortness of breath as well as  headache.  He took sublingual nitroglycerin at home with some help but not  complete resolution and feels much better in the emergency department now on  intravenous nitroglycerin.  The initial cardiac markers are negative, and  his electrocardiogram shows sinus rhythm with nonspecific ST-T wave changes.   ALLERGIES:  SULFA drugs.   CURRENT MEDICATIONS:  1.  Coumadin  10 mg p.o. on Tuesday and Sunday with 7.5 mg on the remaining      days of the week.  2.  Metoprolol 150 mg p.o. b.i.d.  3.  Plavix 75 mg p.o. every day.  4.  Glipizide ER 10 mg every day.  5.  Centrum silver one p.o. every day.  6.  Imdur 30 mg p.o. every day.  7.  Glucophage 100 mg p.o. b.i.d.  8.  Lisinopril 40 mg p.o. every day.  9.  Zomax 0.4 mg p.o. every day.  10. Aspirin 81 mg p.o. every day.  11. Crestor 20 mg p.o. every day.  12. Potassium chloride 20 mEq p.o. every day.  13. Amiodarone 200 mg p.o. b.i.d.  14. Lasix 80 mg p.o. q.a.m. and 40 mg p.o. q.p.m.   PAST MEDICAL HISTORY:  1.  Coronary artery disease as outlined above.  2.  Hypertension.  3.  . Hyperlipidemia.  4.  Diabetes mellitus.  5.  Benign prostatic hypertrophy status post ablation and on chronic      Coumadin therapy as well as amiodarone.  6.  History of lower extremity edema as well as claudication.  7.  History of hepatic echodensity by abdominal ultrasound in December      2006.   SOCIAL HISTORY:  The patient is married and lives in [place].  He denies  any significant tobacco use except for chewing tobacco and no significant  alcohol use.   FAMILY HISTORY:  Significant for cardiovascular disease.   REVIEW OF SYSTEMS:  As described in the history of present illness.  The  patient also states he has a cough which has been nonproductive.  He has had  no fevers or chills.  He has claudication symptoms.  He has had no  palpitations or syncope.  He has had no bleeding problems and follows in the  Coumadin clinic.  Review of systems is otherwise negative.   PHYSICAL EXAMINATION:  VITAL SIGNS:  Blood pressure is 142/83, heart rate is  70 and regular.  GENERAL:  The patient is not hypoxic.  He is well-developed and is  comfortable.  NECK:  Examination of the neck reveals no loud carotid bruits or jugular  venous distention.  LUNGS:  Clear with nonlabored breathing at rest.  CARDIAC:  Exam reveals a  regular rate and rhythm without pericardial rub or  loud murmur.  There is no S3 gallop.  The abdomen is soft and nontender with  no hepatomegaly.  EXTREMITIES:  The extremities show trace edema below the knees.  MUSCULOSKELETAL:  No kyphosis is noted.  NEUROPSYCHIATRIC:  The patient is alert and oriented x3.   LABORATORY DATA:  Hemoglobin 12.9, hematocrit 38.0.  Sodium 140, potassium  3.9, chloride 108, glucose 114, BUN 22, creatinine 1.6.  CK-MB 1.3, troponin  I less than 0.05.  Coagulation studies are pending as is chest x-ray.   IMPRESSION:  1.  Recurrent chest pain, rule out unstable angina.  At this point, symptoms      are reminiscent of prior angina, however, electrocardiogram is not acute      and initial cardiac markers are reassuring.  [name] has been      compliant with his medications and followup and had a myocardial      perfusion study back in October which was negative for ischemia.  2.  Hypertension.  3.  Hyperlipidemia.  4.  Type 2 diabetes.  5.  History of atrial flutter status post ablation and on chronic Coumadin      and amiodarone.  6.  Claudication.   PLAN:  1.  The patient will be admitted to telemetry.  We will continue to cycle      cardiac markers.  2.  Continue home medications with the exception of Coumadin and Glucophage      which will be placed on hold.  We will also treat the patient with      intravenous nitroglycerin.  3.  Follow up on coagulation studies and cardiac markers.  4.  Dr. Juanda Chance will plan to evaluate the patient with question as to whether      repeat angiography would be in the      patient's best interest at this point.  We will obviously need to wait      on his INR to be subtherapeutic.  5.  We will also treat with a proton pump inhibitor.  6.  Further plans to follow.           ______________________________  Jonelle Sidle, M.D. [facility]    SGM/MEDQ  D:  02/14/2005  T:  02/14/2005  Job:  [id number]   cc:   Charlies Constable, M.D. [facility]  [address]  [address]  [place]  [place] [address]   Tillman Abide, M.D.

## 2010-06-17 NOTE — Cardiovascular Report (Signed)
[place]  Patient:    [name], [name]                       MRN: [id number] Proc. Date: 02/21/00 Adm. Date:  [id number] Attending:  Junious Silk CC:         Everardo Beals. Juanda Chance, M.D. [facility]                        Cardiac Catheterization  INDICATIONS:  Positive for Cardiolite study with inferior apical wall ischemia, previous stent to the proximal LAD.  Left main coronary artery had a 30% discrete stenosis.  Left anterior descending artery had diffuse 30% in-stent restenosis proximally.  Distal to the stent there was 70% multiple tubular midvessel lesions.  The distal vessel was subtotally occluded.  Diagonal branches were small and diffusely diseased.  The circumflex coronary artery had a 50 to 60% ostial lesion that appeared stable since 1999.  There was a large branching first obtuse marginal branch which was calcified with 70% bifurcation lesion.  The mid circumflex had 50% multiple discrete lesions.  There was also a 70% distal branch lesion in the first obtuse marginal branch.  Right coronary artery was 100% occluded proximally.  There appeared to be some disease progressing in the ostium with calcification and a 50% stenosis prior to the conus collateral branches.  The conus collateral branches supplied the mid and distal right coronary artery.  RAO ventriculography revealed minimal inferior wall hypokinesis with a normal EF of 55%. There was no MR.  Aortic pressure was 180/76, LV pressure 180/16.  IMPRESSION:  Mr. [name] worst area of circulation continues to be his right coronary artery.  I suspect his collaterals are somewhat less sufficient with his ostial lesion.  His Cardiolite study was positive in this area as well.  However, he does have progression of disease in his obtuse marginal branch and mid LAD and the films will be reviewed with Dr. Juanda Chance.  I suspect that either continued medical therapy or referral for CABG  would be indicated. DD:  02/21/00 TD:  02/21/00 Job: [id number]

## 2010-06-17 NOTE — Cardiovascular Report (Signed)
NAME:  [name], [name] NO.:  [id number]   MEDICAL RECORD NO.:  [id number]          PATIENT TYPE:  INP   LOCATION:  4706                         FACILITY:  [facility]   PHYSICIAN:  Arturo Morton. Riley Kill, M.D. [facility] OF BIRTH:  10-30-1931   DATE OF PROCEDURE:  11/25/2003  DATE OF DISCHARGE:                              CARDIAC CATHETERIZATION   ADDENDUM:  As previously noted, the left anterior descending does insert  into the left anterior descending and this appears to be widely patent.  However, the distal LAD well beyond the graft insertion site demonstrates  subtotal occlusion and diffuse disease involving the apical portion of this  vessel.  This involves diagonal branch as well.  This is a likely source of  ischemia.       TDS/MEDQ  D:  11/25/2003  T:  11/25/2003  Job:  [id number]

## 2010-06-17 NOTE — Cardiovascular Report (Signed)
NAME:  [name], [name] NO.:  [id number]   MEDICAL RECORD NO.:  [id number]                   PATIENT TYPE:  OIB   LOCATION:  6523                                 FACILITY:  [facility]   PHYSICIAN:  Charlies Constable, M.D. [facility]              DATE OF BIRTH:  May 05, 1931   DATE OF PROCEDURE:  07/31/2003  DATE OF DISCHARGE:                              CARDIAC CATHETERIZATION   PROCEDURES PERFORMED:   CARDIOLOGIST:  Charlies Constable, M.D.   CLINICAL HISTORY:  Mr. Jenifer is 77 years old and has had previous bypass  surgery.  He recently developed chest pain and was evaluated in the [facility] lab,  and found to have a tight stenosis just after the anastomosis of the vein  graft to the distal right coronary artery.  He had a patent vein graft to  the marginal and posterolateral branch of the circumflex artery, and patent  internal mammary artery to the LAD and normal LV function.  We brought him  back today for intervention on the lesion in the distal right coronary  artery through the vein graft.  He was enrolled in the Zomax-2 trial.   DESCRIPTION OF PROCEDURE:  The procedure was performed via the right femoral  artery using arterial sheath and  French preformed coronary catheters.  We  had difficulty with the x-ray equipment and had to move rooms twice before  we could get started after we had initially obtained access.  The patient  was given Angiomax bolus and infusion.  We used a right saphenous vein  bypass guide catheter, 6 [place], with side holes and a soft Asahi wire.  We  crossed the lesion through the vein graft into the distal right coronary  artery with the wire without difficulty.  We predilated with a 2.5 x 12 mm  Quantum Maverick performing one inflation up to 10 atmospheres for 30  seconds.  We then deployed  2.5 x 18 mm study stent, deploying this with one  inflation of nine atmospheres for 30 seconds.  We felt higher pressures  would oversize the stent at the  distal margin.  We then post dilated with a  2.75 x 15 mm Quantum Maverick performing one inflation to 14 atmospheres for  30 seconds.   Repeat diagnostic studies were then performed through the guide catheter.   The patient tolerated the procedure well and left the laboratory in  satisfactory condition.   RESULTS:  Initially the stenosis in the distal right coronary artery after  the vein graft insertion site was estimated at 90%.  Following stenting this  improved to 0%.   CONCLUSION:  Successful stenting of  the lesion in the distal right coronary  artery through a vein graft using a Zomax drug-eluting study stent with  improvement in percent narrowing from 90% to 0%.   DISPOSITION:  The patient was returned ______ or further observation.  Charlies Constable, M.D. [facility]    BB/MEDQ  D:  07/31/2003  T:  08/01/2003  Job:  [id number]   cc:   Dr. Orlie Dakin   Cardiopulmonary Laboratory

## 2010-06-17 NOTE — Discharge Summary (Signed)
[place]  Patient:    [name]                       MRN: [id number] Adm. Date:  [id number] Disc. Date: 02/29/00 Attending:  Charlett Lango Dictator:   Loura Pardon, P.A. CCEverardo Beals. Juanda Chance, M.D. [facility]  Tillman Abide, M.D.  Doylene Canning. Ladona Ridgel, M.D. [facility]   Discharge Summary  DATE OF BIRTH:  March 20, 1934  ATTENDING SURGEON:  Salvatore Decent. Dorris Fetch, M.D.  CARDIOLOGISTEverardo Beals Juanda Chance, M.D. [facility].  PRIMARY CAREGIVER:  Tillman Abide, M.D., [place], [place].  Also involved in his care is Doylene Canning. Ladona Ridgel, M.D. [facility]  DISCHARGE DIAGNOSES: 1. Three-vessel atherosclerotic coronary artery disease with atypical angina. 2. Right lower lobe pneumonia developing postoperative day #3 treated with    antibiotics.  SECONDARY DIAGNOSES: 1. Hyperlipidemia. 2. Type 2 diabetes mellitus. 3. History of atrial flutter status post ablation Dr. Lewayne Bunting. 4. Gastroesophageal reflux disease. 5. History of coronary artery disease status post percutaneous transluminal    coronary angioplasty x 3 with stenting of the left anterior descending.  PROCEDURES: 1. February 18, 2000, left heart catheterization, a left ventriculogram and    coronary angiography Dr. Eden Emms.  This study demonstrated that the left    main coronary artery had a 30% discrete stenosis, the left anterior    descending had a diffuse 30% in-stent restenosis, distal to the stent there    was 70% multiple tubular mid vessel lesions, the distal vessel was    subtotally occluded, diagonal branches from the LAD were small and    diffusely diseased, the left circumflex had a 50-60% ostial lesion, the    first obtuse marginal had a 70% bifurcation lesion, the mid circumflex had    50% multiple discrete lesions, there was also a 70% distal branch lesion    in the first obtuse marginal branch, the right coronary artery was 100%    occluded proximally, there appeared to be  some disease progressing in the    ostium with calcification of a 50% stenosis prior to the conus and    collateral branches, there was minimal inferior wall hypokinesis, ejection    fraction 55%, no mitral regurgitation. 2. February 21, 2000, Cardiolite stress test was aborted secondary to chest    pain and electrocardiogram ST depressions in the lateral leads. 3. Carotid duplex ultrasound February 23, 2000 was negative for internal    carotid artery stenosis bilaterally and the patient had palpable pedal    pulses bilaterally. 4. February 24, 2000, coronary artery bypass graft surgery x 4 off pump.    Dr. Charlett Lango, surgeon.  In this procedure, the left internal    mammary artery was connected in an end-to-side fashion to the left anterior    descending coronary artery, a reverse sequential saphenous vein graft was    fashioned from the aorta to the first obtuse marginal and into to the    second obtuse marginal and a reverse saphenous vein graft was fashioned    from the aorta to the distal right coronary artery.  Patient tolerated the    procedure well and was transferred in stable and satisfactory condition    with no blood products given and no inotropes needed.  DISCHARGE DISPOSITION:  [name] is a candidate for discharge on postoperative day #5.  He did have a small short burst eight-beat of atrial  fibrillation on postoperative day #2 but it converted quickly back to a sinus rhythm and has remained in a sinus rhythm ever since.  He has been afebrile except for one fever spike on the evening of postoperative day #3.  White cell count on postoperative day #4 was 6.4.  Chest x-ray reading by radiology was right lower lobe pneumonia/aspiration.  The patient was placed on oral Tequin and will go home on an eight-day course of this.  He was relieved of all supplemental oxygen by postoperative day #2.  He is ambulating independently. His mental status has remained clear in the  postoperative period.  His incisions are healing nicely.  There is no evidence of erythema, swelling, or drainage.  He was gently diuresed in the postoperative period for mild fluid overload.  He goes home on the following medications.  DISCHARGE MEDICATIONS: 1. Percocet 5/325 one to two tabs p.o. q.4-6h. p.r.n. pain. 2. Pravachol 40 mg p.o. b.i.d. 3. Lopressor 50 mg one-half tab every eight hours. 4. Enteric-coated aspirin 325 mg daily. 5. Glucophage 500 mg two tabs twice daily. 6. Tequin 400 mg one daily for eight days.  DISCHARGE ACTIVITY:  Ambulation as tolerated.  He is asked not to lift more than 10 pounds nor to drive for the next six weeks.  DISCHARGE DIET:  Low sodium, low cholesterol, ADA diet.  WOUND CARE:  He may shower daily keeping his incisions clean and dry.  FOLLOWUP:  He has followup with Dr. Juanda Chance in two weeks.  He is asked to call Dr. Delia Chimes office to arrange that appointment and he will see Dr. Dorris Fetch Wednesday, March 14, 2000 at 11:45 a.m.  He is asked to bring the chest x-ray to that visit.  HISTORY OF PRESENT ILLNESS:  [name] is a 77 year old African-American male with a diagnosis of type 2 diabetes mellitus and hyperlipidemia.  He is also status post atrial flutter with radiofrequency ablation.  He has a history of known atherosclerotic coronary artery disease and he is status post prior stenting of the left anterior descending coronary artery.  His left heart catheterization was June 2001 and he presents on February 18, 2000 with episode of atypical chest pain.  He was now pain-free at the time of admission and he was ruled out for myocardial infarction.  He was placed on Lovenox prophylaxis and scheduled for a Cardiolite stress test on the morning of February 20, 2000 and it was aborted secondary to chest pain and D:  02/28/00 TD:  02/29/00 Job: [id number]

## 2010-06-17 NOTE — H&P (Signed)
NAME:  [name], [name] NO.:  [id number]   MEDICAL RECORD NO.:  [id number]          PATIENT TYPE:  INP   LOCATION:  1830                         FACILITY:  [facility]   PHYSICIAN:  Rollene Rotunda, M.D.   DATE OF BIRTH:  03/20/1934   DATE OF ADMISSION:  03/17/2005  DATE OF DISCHARGE:                                HISTORY & PHYSICAL   PRIMARY CARDIOLOGIST:  Dr. Charlies Constable   REASON FOR ADMISSION:  [name] is a 77 year old male with known coronary  artery disease status post recent bare metal stenting of his 70% ostial LIMA-  LAD graft stenosis on January 18 who now returns to the emergency room with  recurrent chest pain.   Patient presented one month ago with chest pain, ruled out for myocardial  infarction, and underwent coronary angiography revealing an 70% ostial LIMA  graft stenosis with residual 99% apical LAD disease, 40% ostial SVG-OM1-OM2-  OM3 stenosis, and 50% stenosis at the OM3 anastomosis site.  There was also  patency of the SVG-RCA graft with a patent distal posterolateral branch  stent and a jailed 80% ostial PDA lesion.   Patient now presents to the emergency room with recurrent pain since his  recent discharge.  However, after much questioning he does state that this  is different from the chest pain which he had prior to the catheterization.  This particular discomfort is recurrent on a daily basis and exacerbated by  movement.  This episode this morning awoke him at 2:00, was described as  sharp and initially in the mid sternum, but then radiated through the upper  right chest into the shoulder and into the neck.  He believes he has some  diaphoresis, but denied any shortness of breath or nausea.   Patient did take one nitroglycerin tablet this morning with no effect  whatsoever.  He did note, however, that his chest pain was exacerbated both  by movement, inspiration, or palpation of the chest wall.   Admission EKG shows normal sinus rhythm  with no changes from his recent EKG.  Initial cardiac markers are negative.   ALLERGIES:  SULFA.   HOME MEDICATIONS:  1.  Plavix 75 daily.  2.  Coated aspirin 325 daily.  3.  Coumadin 5 mg as directed.  4.  Norvasc 2.5 daily.  5.  Lopressor 100 b.i.d.  6.  Imdur 30 daily.  7.  Glipizide ER 10 daily.  8.  Glucophage 1000 b.i.d.  9.  Lisinopril 40 daily.  10. Crestor 20 daily.  11. Amiodarone 200 b.i.d.  12. Lasix 80 b.i.d.  13. K-Dur 20 b.i.d.  14. Flomax 0.4 daily.  15. Nitrostat as directed.   PAST MEDICAL HISTORY:  1.  Coronary artery disease.      1.  Status post bare metal stenting 70% ostial LIMA-LAD graft February 15, 2005.      2.  Residual coronary anatomy as described above.      3.  A history of prior stenting (ZOMAXX) RCA.      4.  Non-ischemic stress  Myoview October 2006.  2.  Chronic Coumadin.  3.  History of atrial flutter.      1.  Status post radiofrequency ablation.      2.  Chronic amiodarone.  4.  Hyperlipidemia.  5.  History of renal insufficiency.  6.  Type 2 diabetes mellitus.  7.  Hypertension.  8.  Hyperlipidemia.  9.  Chronic lower extremity edema.  10. History of hepatic echo density.      1.  Abdominal ultrasound December 2006.   Social, family, and review of systems unchanged since admission February 14, 2005.   PHYSICAL EXAMINATION:  VITAL SIGNS:  Blood pressure 182/79 on admission,  temperature 98.8, pulse 56, respirations 14, saturations 100% on room air.  GENERAL:  77 year old male sitting upright in no apparent distress.  HEENT:  Normocephalic, atraumatic.  NECK:  Preserved bilateral carotid pulses without bruits.  LUNGS:  Clear to auscultation all fields.  HEART:  Regular rate and rhythm (S1, S2).  Significant sternal tenderness  with palpation of the mid sternum and right upper chest.  ABDOMEN:  Soft, nontender with intact bowel sounds, no bruits.  EXTREMITIES:  Palpable bilateral femoral pulses.  Right groin stable  without  hematoma.  Intact distal pulses with trace pedal edema.  NEUROLOGIC:  No focal deficit.   Admission electrocardiogram:  Sinus bradycardia of 56 BPM with normal axis,  no change from EKG February 17, 2005.   LABORATORY DATA:  Hemoglobin 12.1, hematocrit 35, WBC 5.9, platelet 195.  Sodium 138, potassium 4.5, BUN 29, creatinine 2.2 (creatinine 1.4 on February 17, 2005), glucose 138.  INR 4.3.  D-dimer less than 0.22.  Cardiac enzymes  (POC):  MB 2, troponin I less than 0.05.   IMPRESSION:  1.  Recurrent, atypical chest pain.      1.  Reproducible by palpation.  2.  Coronary artery disease.      1.  Status post bare metal stenting ostial left internal mammary artery          graft February 16, 2005.      2.  Otherwise wide patent graft with distal disease treated medically.      3.  History of normal left ventricular function.  3.  Renal insufficiency.      1.  Creatinine currently increased (2.2) from recent discharge level of          1.4.  4.  Type 2 diabetes mellitus.      1.  Glucophage.  5.  Hypertension.   PLAN:  Patient will be admitted for rule out of myocardial infarction with  serial cardiac markers, although he presents with atypical chest pain.  In  the interim he will need adjustment of his current medication regimen.  Glucophage will be placed on hold given the increase in creatinine and we  will also hold Lasix for 24 hours.  Of note, patient was discharged on Lasix  80/40 daily, but states he was taking 80 b.i.d.  He has normal left  ventricular function, no evidence of heart failure by history or  examination, and no significant lower extremity edema.  Therefore, we will  decrease Lasix to 40 b.i.d. to be initiated in 24 hours.  We will also cut  back his potassium to 20 daily.  Serial BMETs will be checked for close  monitoring of electrolytes and renal function.  Patient is currently on lisinopril and we will continue this, again, with close monitoring of  renal  function.  We will also gently hydrate to improve renal perfusion.  Norvasc will be  increased to 5 daily for better blood pressure control.  Coumadin will be  placed on hold for 48 hours to resume at 5 mg daily on Sunday (February 18)  given his elevated INR of 4.3.   Regarding ischemic work-up, no further work-up is recommended if serial  cardiac markers are negative.      Gene Serpe, P.A. [facility]    ______________________________  Rollene Rotunda, M.D.    GS/MEDQ  D:  03/17/2005  T:  03/17/2005  Job:  [id number]   cc:   Tillman Abide, M.D.

## 2010-06-17 NOTE — Op Note (Signed)
[place]  Patient:    [name], [name]                       MRN: [id number] Proc. Date: 07/20/99 Adm. Date:  [id number] Disc. Date: [id number] Attending:  Lenoria Farrier CC:         Dr. [place]. Juanda Chance, M.D. [facility]             Electrophysiology Laboratory                           Operative Report  PREOPERATIVE DIAGNOSIS:  Paroxysmal atrial flutter.  POSTOPERATIVE DIAGNOSIS:  Paroxysmal atrial flutter.  PROCEDURE:  Invasive electrophysiological study, arrhythmia mapping, and radiofrequency catheter ablation.  SURGEON:  Nathen May, M.D., [facility].  ANESTHESIA:  DESCRIPTION OF PROCEDURE:  Following obtaining an informed consent, the patient was brought to the electrophysiology laboratory and placed on the fluoroscopic table in the supine position.  After routine prep and drape, cardiac catheterization was performed with local anesthesia and conscious sedation.  Noninvasive blood pressure monitoring, transcutaneous oxygen saturation monitoring, and end-tidal CO2 monitoring were performed continuously throughout the procedure.  Following the procedure, the catheters were removed.  Hemostasis was obtained and the patient was transferred to the floor in stable condition.  CATHETERS:  A 5-French quadripolar catheter was inserted via the left femoral vein to the A-V junction.  A 7-French duodecapolar catheter was inserted via the right femoral vein to the tricuspid annulus and the coronary sinus.  A 7-French 8 mm deflectable tip catheter was inserted via the right femoral vein via a Schwartz (SR0) sheath to mapping sites in the posteroseptal space.  Surface leads I, aVF, and V1 were monitored continuously throughout the procedure.  Following insertion of the catheters, the stimulation protocol included: Incremental atrial pacing Incremental ventricular pacing Single atrial extrastimuli at a pace cycle length of  600 msec Burst coronary sinus pacing.  RESULTS: Surface Electrocardiogram:  Rhythm is sinus initial and sinus final. Cycle length is 936 msec initial and 955 msec final. P-R interval is 174 msec initial and 172 msec final. QRS duration is 108 msec initial and 100 msec final. Q-T interval is 407 msec initial and 368 msec final. P-wave duration is 157 msec initial and 157 msec final. Bundle branch block is absent initial and absent final. Pre-excitation is absent initial and absent final  A-V Nodal Function: A-H interval is 75 msec initial and 89 msec final. A-V Wenckebach is 360 msec initial, V-A Wenckebach was 350 msec final. The A-V nodal effective refractory period had a pace cycle length of 600 msec and was less than or equal to 300 msec, representing a functional refractory period.  In the atrium, A-V conduction through this coupling interval was continuous.  His-Purkinje System Function: H-V interval was 54 msec initial and 57 msec final His bundle duration was 15 msec initial and 17 msec final  ACCESSORY PATHWAY FUNCTION:  No evidence of an accessory pathway was identified.  Arrhythmia is induced.  Counterclockwise atrial flutter was induced with coronary sinus pacing at a cycle length of 190 msec.  It was terminated subsequently with coronary sinus pacing at 160 msec.  Characteristics for the tachycardia: a. Counterclockwise rotation around the tricuspid annulus. b. ________ the isthmus. c. A cycle length of 219 msec.  Fluoroscopy time:  A total time of 20  minutes of fluoroscopy was utilized at 7.5 to 15 frames per second.  Radiofrequency energy:  A total of 24 minutes and 58 seconds of radiofrequency energy was applied in multiple applications at approximately 5 oclock in the tricuspid annulus.  The first applications were applied during sinus rhythm and because of a dropoff at the tendon of Todaro with an inability to terminate tachycardia, tachycardia was  terminated by pacing.  Thereafter, mapping was undertaken during coronary sinus pacing looking for confluence of, i.e., absence of double potentials.  This was identified at the terminal ramification of the tendon of Todaro into the posterior aspect of the coronary sinus os.  A total of five minutes of radiofrequency energy was applied at this site in multiple applications, as there was recurrence of conduction through this area after initial applications.  Following the final application, conduction across the isthmus was not seen for greater than 30 minutes.  IMPRESSION: 1. Normal sinus function. 2. Abnormal atrial function with prolonged intra-atrial conduction times,    paroxysmal atrial flutter.  Successful radiofrequency catheter ablation was    undertaken of the tricuspid annulus - inferior vena cava isthmus, resulting    in bidirectional isthmus block and the elimination thereby of the substrate    for the patients atrial flutter. 3. Normal atrioventricular nodal function. 4. Normal His-Purkinje system function. 5. No accessory pathway. 6. Normal ventricular response to programmed stimulation as described above.  SUMMARY IN CONCLUSION:  The results of electrophysiological testing confirmed typical counter clockwise flutter as the mechanism underlying the patients tachycardia.  Radiofrequency successfully interrupted conduction along the tricuspid annulus - IVC isthmus, eliminating the substrate for the patients tachycardia.  RECOMMENDATIONS: 1. Observe overnight. 2. Anticipate discharge in the a.m. 3. Aspirin daily for six weeks. 4. Endocarditis prophylaxis for three months. DD:  07/20/99 TD:  07/22/99 Job: [id number]

## 2010-06-17 NOTE — H&P (Signed)
NAME:  [name], [name] NO.:  [id number]   MEDICAL RECORD NO.:  [id number]          PATIENT TYPE:  INP   LOCATION:  3704                         FACILITY:  [facility]   PHYSICIAN:  Lowella Bandy, MD      DATE OF BIRTH:  03/20/1934   DATE OF ADMISSION:  06/30/2005  DATE OF DISCHARGE:                                HISTORY & PHYSICAL   PRIMARY CARDIOLOGIST:  Dr. Juanda Chance   CHIEF COMPLAINT:  Chest pain.   HISTORY OF PRESENT ILLNESS:  Mr. Blaylock is a very pleasant 77 year old  African-American male with coronary artery disease status post coronary  artery bypass grafting and multiple PCI including bare-metal stent to the  LIMA to the LAD in January 2007 who was also readmitted in late February  with noncardiac chest pain, who has been having episodic left-sided chest  pain for approximately 1 week.  These episodes are sharp, last just a few  seconds, and are not exertional.  They appear somewhat worse with movement  of his left arm and he has been active physically at home recently and  thinks that he may have pulled a muscle.  He states that this pain is not  similar to the chest pain he has had previously with his previous  interventions.   PAST MEDICAL HISTORY:  1.  Coronary artery disease status post coronary artery bypass grafts (LIMA      to the LAD, saphenous vein graft to obtuse marginal, saphenous vein      graft to the right coronary artery), status post multiple PCI including      bare-metal stent to the LIMA to the LAD in January 2007, Zomaxx to the      RCA previously.  He has normal left ventricular function.  2.  Paroxysmal atrial flutter.  3.  Hypertension.  4.  Hyperlipidemia.  5.  Type 2 diabetes mellitus.  6.  History of renal insufficiency.  7.  Chronic lower extremity edema.  8.  GERD.  9.  History of elevated liver function tests of unclear etiology, possibly      amiodarone, possibly statin.   MEDICATIONS:  1.  Aspirin 325 mg p.o. once a  day.  2.  Plavix 75 mg p.o. once a day.  3.  Coumadin 10 mg on Sunday and Tuesday, 7.5 mg on other days.  4.  Lopressor 100 mg p.o. b.i.d.  5.  Imdur 30 mg p.o. once a day.  6.  Norvasc 2.5 mg p.o. once a day.  7.  Lasix 80 mg in the morning and 40 mg in the evening.  8.  Potassium chloride, unclear dose.  9.  Zetia 10 mg p.o. once a day.  10. Glucophage 1000 mg p.o. twice a day with meals.  11. Glipizide 10 mg p.o. once a day.   ALLERGIES:  SULFA.   SOCIAL HISTORY:  The patient is a nonsmoker.  He has not had any alcohol in  over 40 years.   FAMILY HISTORY:  He has a sister with coronary artery disease in her 56s who  also has diabetes and hypertension.  REVIEW OF SYSTEMS:  Positive for leg pain and cramping with walking,  frequent headaches, occasional low blood sugars and occasional gum bleeding  when he brushes his teeth.  Otherwise, 10 systems reviewed and negative  other than is noted in the HPI.   PHYSICAL EXAMINATION:  VITAL SIGNS:  Temperature 98.0, blood pressure  132/81, pulse 68.  GENERAL:  The patient is comfortable, chest-pain free, and well-appearing in  no apparent distress.  HEENT:  Normocephalic, atraumatic, oropharynx clear.  NECK:  Supple, no masses, no JVD or carotid bruits.  CARDIOVASCULAR:  Regular rate and rhythm.  No murmurs, rubs, or gallops.  CHEST:  Clear to auscultation bilaterally.  ABDOMEN:  Obese, soft, nontender/nondistended, normal bowel sounds.  EXTREMITIES:  Warm, 3+ lower extremity edema bilaterally (chronic).  NEUROLOGIC:  Cranial nerves grossly intact, moving all extremities well.  PSYCHIATRIC:  Alert and oriented x3.  Affect appropriate.  SKIN:  No rash.  MUSCULOSKELETAL:  No joint effusions.   EKG:  Normal sinus rhythm at 62 beats per minute, normal EKG.   Chest x-ray:  Clear.   LABORATORY:  White blood cell count 4.8, hemoglobin 11.1, hematocrit 33,  platelets 143.  Sodium 139, potassium 4.2, chloride 108, bicarb 24, BUN 14,   creatinine 1.6, glucose 122.  INR 3.2.  Troponin less than 0.05, CK-MB 1.4.   ASSESSMENT AND PLAN:  A 77 year old male with coronary artery disease status  post prior bypass grafting and PCI who presents with atypical chest pain  with a very low suspicion for acute coronary syndrome.  His EKG is normal  and first set of enzymes are negative.  This is likely musculoskeletal in  etiology.   1.  We will admit him to telemetry and complete a rule out of myocardial      infarction.  2.  We will hold his Coumadin for now secondary to his elevated INR and in      the unlikely event that he would require some invasive study during this      hospitalization.  3.  We will decrease his aspirin at present to 81 mg daily given his Plavix      and Coumadin therapy.  4.  We will consider stopping his Norvasc given his chronic lower extremity      edema.  Given his diabetes and chronic renal insufficiency, he might      possibly be a candidate for an ACE inhibitor or ARB therapy in the      future if no medical contraindications.  5.  We will continue the rest of his cardiovascular regimen as he has been      taking as an outpatient.  6.  We will hold his oral anti-diabetic therapy given his relatively low      blood sugar and his current n.p.o. status.  On discharge, he might      require some adjustment of his diabetic therapy given his report of low      blood sugars.  7.  He will likely be able to be discharged later this morning once he rules      out for MI.  Any stress testing could likely be deferred to the      outpatient setting.  8.  An outpatient workup for potential peripheral vascular disease given      symptoms concerning for potential claudication might be indicated.  9.  No DVT prophylaxis necessary given his elevated INR on Coumadin therapy.      Tomma Rakers  Nevin Bloodgood, MD  Electronically Signed    JJC/MEDQ  D:  07/01/2005  T:  07/01/2005  Job:  [id number]

## 2010-06-17 NOTE — Assessment & Plan Note (Signed)
[facility]                              CARDIOLOGY OFFICE NOTE   NAME:Colborn, TRIGG                       MRN:          [id number]  DATE:09/22/2005                            DOB:          [birth year]    PRIMARY CARE PHYSICIAN:  Karie Schwalbe, MD   REFERRING PHYSICIAN:  Dr. Assunta Gambles .   CLINICAL HISTORY:  Mr. Pile is 77 years old and has documented coronary  disease and has previous bypass surgery.  Had a bare metal stent  to the  ostium of left internal mammary artery in January of this year.  The vein  graft to the circumflex system and the vein graft to the right coronary  system were patent.  He had a Myoview scan performed in June, which showed  no evidence of ischemia.  He has been doing well from a cardiac standpoint.  He has had no recent chest pain or shortness of breath.  He has had  occasional palpitations.   He is scheduled for __________ in the near future by Dr. Assunta Gambles, and he  is here for clearance for that today.   He has also had symptoms of edema of both lower extremities, which has  become worse recently but has been present for a number of months to a year.  He has been on Lasix with some improvement but has had persistent edema.   PAST MEDICAL HISTORY:  1. Abnormal liver function tests, thought to possibly be related to      medication.  2. Dysphagia with no obstructive disease, by barium swallow.  3. Status post radiofrequency ablation for atrial flutter.  4. Hyperlipidemia.  5. Mild renal insufficiency.  6. Hypertension.  7. Diabetes.   For family history and social history, please see old records.   REVIEW OF SYSTEMS:  Positive for low back pain.   PHYSICAL EXAMINATION:  VITAL SIGNS:  On examination today, blood pressure  121/73, pulse 60 and regular.  NECK:  There is no venous distention.  The carotid pulses are full without  bruits.  CHEST:  Clear without rales or rhonchi.  HEART:  Rhythm is  regular.  Heart sounds were normal.  There were no  murmurs, gallops or rubs.  ABDOMEN:  Quite protuberant.  I could not tell if this is fluid.  There is  no definite hepatosplenomegaly.  EXTREMITIES:  There was 3+ edema bilaterally, slightly worse on the right.  The pedal pulses were equal.   Electrocardiogram showed nonspecific ST/T changes.   IMPRESSION:  1. Preoperative evaluation prior to circumcision.  2. Coronary artery disease, status post coronary artery bypass graft      surgery and status post stenting of the ostium  of the left internal      mammary artery graft to the left anterior descending artery using a      bare metal stent in January, 2007, now stable with negative Cardiolite      scan.  3. Good left ventricular function.  4. Status post atrial flutter ablation.  5. Edema of the lower extremities, probably related  to venous      insufficiency, rule out deep venous thrombophlebitis.  6. Diabetes.  7. Hyperlipidemia.  8. Renal insufficiency.  9. Hypertension.   RECOMMENDATIONS:  I think Mr. Schrader should be okay to have circumcision.  I  think it is alright for him to stop his Coumadin, aspirin, and Plavix five  days before his surgery.  He has a bare metal stent, and it has been eight  months since his stent was put in.   I am concerned about his lower extremity edema.  We will get Doppler studies  to rule out deep venous thrombophlebitis, and we will increase his Lasix  from 40 to 80 b.i.d.  Will need to get a follow-up renal profile when he  comes in for his Dopplers.  I will put him on six month followup.  We will  see him sooner if needed.                               Bruce Elvera Lennox Juanda Chance, MD, [facility]    BRB/MedQ  DD:  09/22/2005  DT:  09/22/2005  Job #:  [id number]   cc:   Assunta Gambles, MD

## 2010-06-17 NOTE — Cardiovascular Report (Signed)
NAME:  [name], [name] NO.:  [id number]   MEDICAL RECORD NO.:  [id number]                   PATIENT TYPE:  OIB   LOCATION:  6501                                 FACILITY:  [facility]   PHYSICIAN:  Charlies Constable, M.D. [facility]              DATE OF BIRTH:  09-Nov-1931   DATE OF PROCEDURE:  07/24/2003  DATE OF DISCHARGE:  07/24/2003                              CARDIAC CATHETERIZATION   CLINICAL HISTORY:  [name] is 77 years old and had bypass surgery in  2002.  Recently, he has had some chest pain which was felt to be atypical  for ischemia but had a scan which showed apical and inferior ischemia.  For  this reason, he was scheduled for evaluation with catheterization in the [facility]  lab.   PROCEDURE:  The procedure was performed via the right femoral artery using  arterial sheath and 4 French preformed coronary catheters. A LIMA catheter  was used for injection of the LIMA graft.  Distal aortogram was performed to  rule out abdominal aortic aneurysm.  The patient tolerated the procedure  well and left the laboratory in satisfactory condition.   RESULTS:  The aortic pressure was 180/82 with a mean of 121.  Left  ventricular pressure is 180/15.   Left main coronary artery:  The left main coronary was free of significant  disease.   Left anterior descending artery:  The left anterior descending artery was  completely occluded near its origin.   Circumflex artery:  The circumflex artery was completely occluded proximally  after a small ramus branch.   Right coronary artery:  The right coronary artery was completely occluded  proximally.   The saphenous vein graft to the distal right coronary artery was patent, but  there was a 90% stenosis just after the anastomosis.   The distal vessel was somewhat small vessel.   The saphenous vein graft to the marginal and posterior lateral branch of the  right coronary artery was patent and functioned normally.   The  LIMA graft to the LAD was patent and functioned normally, but the LAD  was totally occluded near the apex.  There was 70% narrowing proximal to the  vein graft insertion site and there were two diagonal branches which arose  proximal to this.   LEFT VENTRICULOGRAM:  The left ventriculogram performed in the RAO  projection showed good wall motion with no areas of hypokinesis.  The  estimated ejection is 60%.   DISTAL AORTOGRAM:  Distal aortogram was performed which showed patent renal  arteries and no significant aortoiliac obstruction.   CONCLUSION:  1. Coronary artery disease status post coronary artery bypass graft surgery     in 2002.  2. Severe native vessel disease with total occlusion of the left anterior     descending artery, circumflex and right coronary artery.  3. Patent vein graft to the distal right coronary artery with  90% stenosis     after the insertion site, patent vein graft to the marginal and posterior     lateral branch of the circumflex artery, patent left internal mammary     artery graft to the left anterior descending with total occlusion in the     left anterior descending near the apex.  4. Normal left ventricular function.   RECOMMENDATIONS:  The patient has disease in the distal right and distal LAD  consistent with his Cardiolite scans.  The right coronary artery is suitable  for PCI and will plan to bring [name] back next week for intervention.                                               Charlies Constable, M.D. [facility]    BB/MEDQ  D:  07/24/2003  T:  07/25/2003  Job:  [id number]   cc:   Dr. Alphonsus Sias

## 2010-06-17 NOTE — Discharge Summary (Signed)
[facility]  Patient:    [name], [name]                       MRN: [id number] Adm. Date:  [id number] Attending:  Learta Codding Dictator:   Abelino Derrick, P.A.C. [facility] CC:         Salvatore Decent. Dorris Fetch, M.D.  Bruce Elvera Lennox Juanda Chance, M.D. [facility]   Referring Physician Discharge [place]  ADDENDUM:  Please see the previous discharge summary on [name].  He was admitted on March 18, 2000, with chest pain.  He has had previous bypass on February 24, 2000.  He was to be discharged on March 20, 2000, but a CT scan prior to discharge showed a left basilar cardiac effusion.  It was decided to keep him in the hospital to evaluate this further.  He had an echocardiogram done on March 20, 2000, which revealed good LV function and no pericardial effusion.  He was seen in consult by Salvatore Decent. Dorris Fetch, M.D., who evaluated the patient and the CT.  His ultimate impression was that he was not sure of the significance of the flume, but he felt it may be a resolving hematoma from his surgery.  He does not recommend a follow-up CT scan at this point.  He talked at length with the patient regarding his pain medications and encouraged him to take pain medicines as needed.  Salvatore Decent Dorris Fetch, M.D., will see him in follow-up next week.  The patient will see Bruce R. Juanda Chance, M.D., in four to six weeks.  His medications are as dictated in the March 18, 2000, to March 20, 2000, discharge summary and they include Protonix 40 mg a day, Pravachol 40 mg a day, aspirin q.d., Lasix 40 mg q.d., potassium 20 mEq a day, Toprol 75 mg q.8h., Xanax 0.2 mg p.r.n., and Glucophage 1 g b.i.d.  He is set up to get samples at our office for what medications we have there. DD:  03/22/00 TD:  03/22/00 Job: [id number]

## 2010-06-17 NOTE — Cardiovascular Report (Signed)
NAME:  AVIN, GIBBONS NO.:  [id number]   MEDICAL RECORD NO.:  [id number]          PATIENT TYPE:  OIB   LOCATION:  6501                         FACILITY:  [facility]   PHYSICIAN:  Charlies Constable, M.D. [facility] DATE OF BIRTH:  22-Oct-1931   DATE OF PROCEDURE:  04/20/2004  DATE OF DISCHARGE:                              CARDIAC CATHETERIZATION   CLINICAL HISTORY:  Mr. Maciolek is 77 years old and has had prior bypass  surgery and 9 months ago, had stenting of the distal right coronary artery  through a vein graft as part of the Zomax trial using a Zomax study stent.  He returns today for a followup catheterization as part of the study  protocol.   PROCEDURE:  The procedure was performed via the right femoral artery using  arterial sheath and 6-French preformed coronary catheters.  A 6-French  catheter was used to facilitate QCA as part of the Zomax trial  We used his  right bypass graft catheter for injection of the vein graft to the right  coronary artery.  We used a LIMA catheter for injection of a LIMA graft.  The right femoral artery was closed with AngioSeal at the end of the  procedure.  The patient tolerated the procedure well and left the laboratory  in satisfactory condition.   RESULTS:  Left main coronary artery:  The left main coronary artery is free  of significant disease.   Left anterior descending artery:  The left anterior descending artery was  completely occluded its origin.   Circumflex artery:  The circumflex artery was completely occluded at its  origin.   Right coronary artery:  The right coronary artery was completely occluded at  its origin.   The saphenous vein graft to the marginal and posterolateral branch of the  circumflex artery was patent and functioned normally.   The LIMA graft to the LAD was patent.  There was 50% narrowing its ostial  portion.  The LAD was completely occluded distal to the insertion site about  2/3rds the way down the  vessel near the apex.  There was also a 70%  narrowing proximal to the insertion of the LIMA graft which potentially  could compromise the first diagonal branch.   The saphenous vein graft to the distal right coronary artery was patent and  functioned normally.  The stent which was located in the native vessel just  distal to the graft insertion site had 0% stenosis.  This stent crossed the  posterior descending branch and there was 50% narrowing at the ostium of the  posterior descending branch.  This site also consisted of 2 small  posterolateral branches.   LEFT VENTRICULOGRAM:  The left ventriculogram performed in the RAO  projection showed good wall motion with no areas of hypokinesis.  The  estimated ejection fraction is 60%.   The aortic pressure was 144/60 with a mean of 91.  Left ventricular pressure  was 144/15.   CONCLUSION:  1.  Coronary artery disease, status post remote coronary artery bypass graft      surgery and prior percutaneous coronary intervention.  2.  Severe native vessel disease with total occlusion of the left anterior      descending, circumflex and right coronary artery.  3.  Patent vein grafts to the distal right coronary artery with 0% stenosis      at the Zomax study stent site in the distal right coronary artery with      50% ostial narrowing in the posterior descending branch, patent vein      graft to the marginal and posterolateral branch of the circumflex      artery, and 50% ostial stenosis in the left internal mammary artery      graft to the left anterior descending with total occlusion of the distal      left anterior descending.  4.  Normal left ventricular function with estimated ejection fraction of      50%.   RECOMMENDATIONS:  All grafts are patent and the stent is widely patent also.  We will plan reassurance and continue secondary risk factor modification.  We will arrange a PA followup in 1-2 weeks for femoral artery check and I   will plan to see the patient back in 3 months.      BB/MEDQ  D:  04/20/2004  T:  04/20/2004  Job:  [id number]   cc:   Dr. Doristine [facility]   [facility] Cardiopulmonary Lab

## 2010-06-17 NOTE — H&P (Signed)
NAME:  [name], [name] NO.:  [id number]   MEDICAL RECORD NO.:  [id number]          PATIENT TYPE:  EMS   LOCATION:  MAJO                         FACILITY:  [facility]   PHYSICIAN:  Doylene Canning. Ladona Ridgel, M.D.  DATE OF BIRTH:  1931/08/17   DATE OF ADMISSION:  11/24/2003  DATE OF DISCHARGE:                                HISTORY & PHYSICAL   ADMITTING DIAGNOSIS:  Unstable angina.   CHIEF COMPLAINT:  Chest pain.   HISTORY OF PRESENT ILLNESS:  The patient is a very pleasant 77 year old male  with a history of known coronary artery disease, status post bypass surgery  in the past, who underwent angioplasty and stenting back in July 2005.  The  patient was in his usual state of health until this morning when he  developed severe substernal chest pain associated with shortness of breath  and nausea and diaphoresis.  He states the pain was just like it was prior  to his bypass surgery several years ago.  He is admitted for additional  evaluation.  In the emergency department, nitroglycerin resolved his pain.   PAST MEDICAL HISTORY:  1.  Diabetes diagnosed several years ago, for which he is on Glucophage.  2.  Hypertension for many years.  3.  He has known coronary artery disease, as  previously noted.  His bypass      surgery was four years ago.  4.  His past medical history is otherwise unremarkable, except for      orchiectomy several years ago.   FAMILY HISTORY:  Notable for coronary artery disease.   SOCIAL HISTORY:  The patient denies tobacco use at present.  Although he  used to chew tobacco, he never smoked it.  He denies alcohol use.  He is  married and lives here in [place].   REVIEW OF SYSTEMS:  Negative for vision or hearing problems.  He denies  cough, but does have occasional pleuritic pain which he has had since his  bypass surgery.  This was not the pain that brought him into the hospital.  He denies syncope.  He denies palpitations.  He denies dyspnea,  except as  previously noted.  He denies polyuria, polydipsia, heat or cold intolerance,  recent weight changes, skin problems, nausea, vomiting, diarrhea, or  constipation, or any neurologic problems.   PHYSICAL EXAMINATION:  GENERAL:  He is a pleasant 77 year old man in no  distress.  BLOOD PRESSURE:  160/82.  PULSE:  70 and regular.  RESPIRATIONS:  16.  HEENT:  Normocephalic and atraumatic.  The pupils were equal and round.  Oropharynx was moist.  Sclerae were anicteric.  NECK:  Revealed no jugular venous distention.  There was no thyromegaly.  The trachea was midline.  CARDIOVASCULAR:  Regular rate and rhythm, with normal S1 and S2.  There was  an S4 gallop present.  ABDOMEN:  Soft, nontender, nondistended.  There was no organomegaly.  EXTREMITIES:  No cyanosis, clubbing, or edema.  The pulses were 2+ and  symmetric.  NEUROLOGIC:  Alert and oriented x 3, with cranial nerves II-XII grossly  intact.  The strength  was 5/5 and symmetric.   EKG demonstrates sinus rhythm, normal axis and intervals.  There were no  acute ST-T wave changes present.  He did have residual  T wave inversions in  leads aVL and I and borderline criteria for LVH.   IMPRESSION:  1.  Unstable angina.  2.  Coronary artery disease, status post bypass surgery.  3.  Diabetes.  4.  Hypertension.   DISCUSSION:  [name]'s pain is presently resolved.  Will continue  heparin, Plavix, nitroglycerin, continue his prior medications.  Obtain  serial cardiac enzymes, and plan for left heart catheterization.       GWT/MEDQ  D:  11/24/2003  T:  11/24/2003  Job:  [id number]   cc:   Charlies Constable, M.D. [facility]   Tillman Abide, M.D.

## 2010-06-17 NOTE — Cardiovascular Report (Signed)
NAME:  [name], [name] NO.:  [id number]   MEDICAL RECORD NO.:  [id number]          PATIENT TYPE:  INP   LOCATION:  3799                         FACILITY:  [facility]   PHYSICIAN:  Salvadore Farber, M.D. LHCDATE OF BIRTH:  03/20/1934   DATE OF PROCEDURE:  02/16/2005  DATE OF DISCHARGE:                              CARDIAC CATHETERIZATION   .   PROCEDURE:  Left heart catheterization, abdominal aortography, coronary and  bypass graft angiography, bare-metal stenting to the ostium of the left  internal mammary artery graft, intravascular ultrasound of the left internal  mammary artery graft, Star closure of the right common femoral arteriotomy  site.   INDICATIONS:  [name] is a 77 year old gentleman status post coronary  artery bypass grafting in 2000 and subsequent stenting of the distal RCA.  He now presents with chest pain reminiscent of his prior anginal pain.  The  pain recurred in the hospital although he ruled out for myocardial  infarction by serial enzymes and electrocardiograms.  He was referred for  diagnostic angiography and possible percutaneous coronary intervention.  The  angiography was delayed while his INR was allowed to become nontherapeutic.   PROCEDURE TECHNIQUE:  Informed consent was obtained.  Under 1% lidocaine and  local anesthesia, a 5-French sheath was placed in the right common femoral  arteries in the modified Seldinger technique.  Diagnostic angiography was  performed using JL-4, JR-4 catheters for the native coronaries, JR-4 for  each of the vein grafts, JR-4 for the subclavian and LIMA catheter for left  internal mammary artery graft.  Left heart catheterization was performed  using a pigtail catheter.  Ventriculography was not performed to minimize  contrast use.  Abdominal aortography was performed by power injection with  the pigtail catheter positioned in the suprarenal abdominal aorta.   Diagnostic images demonstrated what  appeared to be a severe stenosis of the  ostium of the LIMA.  We decided to perform intravascular ultrasound  interrogation of this to clarify his stenosis.  Anticoagulation was  initiated with heparin.  ACT was increased to greater than 300 seconds.  A  sheath was upsized over wire to 6-French.  A 6-French LIMA catheter was  advanced into the subclavian and selectively engaged in the left internal  mammary artery.  A Prowater wire was advanced into the graft without  difficulty.  Intravascular ultrasound was performed using automated  pullback.  This confirmed a severe stenosis at the ostium of the LIMA.  I  then predilated the stenosis using a 3.0x12 mm Maverick at six atmospheres.  The lesion yielded readily.  I then stented using a 3.5x12 mm Vision stent  at 14 atmospheres.  I then postdilated the proximal portion of the stent  using a 4.0x12 mm Quantum at 16 atmospheres.  Final angiography demonstrated  no residual stenosis and TIMI-3 flow distally.  There was no dissection.  Repeat intravascular ultrasound demonstrated the stent to protrude  approximately 1 mm into the subclavian.  It was well expanded and fully  opposed.   The arteriotomy was then closed using a Star close device.  Complete  hemostasis was obtained.  He was then transferred to the holding room in  stable condition.   COMPLICATIONS:  None.   FINDINGS:  1.  LV:  168/10/15.  2.  No aortic stenosis on pullback.  3.  There was normal single renal arteries bilaterally.  4.  Left main:  Angiographically normal.  5.  LAD:  The vessel was occluded proximally at the site of occlusive in-      stent restenosis.  There is a LIMA to the LAD which is a LIMA graft to      the mid LAD which is patent but had an ostial 70% stenosis which was      stented to no residual using a bare-metal stent.  The LAD has a 99%      stenosis at the apex over a long segment.  The vessel distal to this is      quite small, however, the mid  vessel and much of the distal vessel are      free of significant disease.  6.  Circumflex:  The vessel was occluded proximally.  There is a sequential      vein graft supplying three marginals.  This vein graft has an ostial 40%      stenosis.  The anastomosis with the third marginal has a 50% stenosis.  7.  RCA:  The vessel was occluded proximally.  There is a patent vein graft      to the distal RCA.  Distal to the touch down of this graft there is a      stent extending into the posterior left ventricular branch and jailing a      small PDA.  The small jailed PDA has an 80% ostial stenosis.   IMPRESSION/RECOMMENDATIONS:  Successful percutaneous intervention of the  severe stenosis at the ostium of the LIMA.  There remains severe native  vessel disease of the small most distal portion of the LAD as well as an 80%  stenosis of a small jailed PDA.  Will plan on medical management of these.  Blood pressure control will be important.   Will plan on continuing him on a combination of aspirin, Plavix, and  Coumadin for 30 days.  At that point, the Plavix will be discontinued and he  will be maintained on low-dose aspirin plus Coumadin indefinitely.      Salvadore Farber, M.D. [facility]  Electronically Signed     WED/MEDQ  D:  02/16/2005  T:  02/16/2005  Job:  [id number]   cc:   [name], M.D. [facility]  [address]  [address]  [place]  [place] [address]   Dr. Tyrone Gross (?)

## 2010-06-17 NOTE — Discharge Summary (Signed)
. [facility]  Patient:    [name]                       MRN: [id number] Adm. Date:  [id number] Disc. Date: 07/21/99 Attending:  Lenoria Farrier Dictator:   Lavella Hammock, P.A. CC:         Tillman Abide, M.D.             Bruce Elvera Lennox Juanda Chance, M.D. [facility]                           Discharge Summary  DATE OF BIRTH:  March 20, 1934  PROCEDURES:  1. Cardiac catheterization.  2. Coronary arteriogram.  3. Left ventriculogram.  4. Electrophysiology study with radiofrequency catheter ablation.  HOSPITAL COURSE:  [name] is a 77 year old male with known coronary artery disease who had an episode of atrial flutter which decreased to approximately 100 with Cardizem.  He was admitted for further management and evaluation.  He ruled out for an MI and had a cardiac catheterization which showed an LAD, with approximately 30% in stent restenosis and a 50% distal stenosis.  The circumflex had a 50-70% and two 40% proximal stenoses.  The RCA, was totalled with collateral circulation.  The left ventricular function was normal with an ejection fraction of about 50%.  It was felt that medical therapy was indicated, and because of the atrial flutter he was scheduled for ablation.  He had electrophysiology study with radiofrequency catheter ablation on July 20, 1999, and there was successful ablation of a bidirectional isthmus block.  The patient tolerated the procedure well and the sheaths were removed without difficulty.  On July 21, 1999, he was complaining of a cough requiring cough medicine, a productive cough with yellowish-greenish sputum.  He was started on Cipro.  A chest x-ray was obtained which showed stable cardiac enlargement with no active disease.  It was felt that this was bronchitis.  They also had an EKG checked which showed no significant changes.  He was having some pain in his side and there was some concern about shingles but there  were no lesions that were demonstrating this and it was felt that he should follow up with his primary care physician if he continued to have pain or any further problems. He was considered stable for discharge on July 21, 1999.  CHEST X-RAY:  Stable cardiac enlargement with no active disease.  LABORATORY DATA:  Hemoglobin 15.8, hematocrit 4.0, WBCs 9.2, platelets 201. Sodium 141, potassium 4.4, chloride 103, CO2 29, BUN 13, creatinine 1.0, glucose 148.  TSH 4.583.  CK-MB #1:  225/2.1; CK-MB #2: 205/6.3; CK-MB #3 171/4.1.  DISCHARGE CONDITION:  Improved.  CONSULTATIONS:  Electrophysiology.  COMPLICATIONS:  None.  DISCHARGE DIAGNOSES:  1. Atrial flutter, status post radiofrequency catheter ablation.  2. Chest pain, status post catheterization this admission showing total RCA     with collaterals and otherwise nonobstructive coronary artery disease.  3. Non-insulin dependent diabetes mellitus.  4. Hyperlipidemia.  5. Gastroesophageal reflux disease.  6. Preserved left ventricular function.  7. Right flank/abdominal pain, possible shingles.  8. Productive cough, possible bronchitis.  DISCHARGE INSTRUCTIONS:  ACTIVITY:  His activity level is to include nothing strenuous but is to be as tolerated.  DIET:  He is to stick to a low fat diabetic diet.  He is to call the office for bleeding, swelling or drainage at the catheterization site.  He is not to take Cardizem or Soma for now.  FOLLOWUP:  he is to followup with Dr. Alphonsus Sias p.r.n.  He has an appointment with Dr. Juanda Chance on August 09, 1999, at 3 p.m.  DISCHARGE MEDICATIONS:  1. Nitroglycerin p.r.n.  2. Glucophage 1000 mg b.i.d. restart July 22, 1999.  3. Imdur 60 mg q.d.  4. Prevacid 30 mg q.d.  5. Pravachol 20 mg b.i.d.  6. Atenolol 50 mg q.d.  7. Tussionex 1 tsp q.12h. p.r.n.  8. Multivitamin q. day.  9. Aspirin 325 mg q. day. 10. Cipro 500 mg b.i.d. x 10 days. 11. Voltaren 75 mg b.i.d. for 1 month. DD:  07/21/99 TD:   07/21/99 Job: [id number]

## 2010-06-17 NOTE — Discharge Summary (Signed)
NAME:  [name], [name] NO.:  [id number]   MEDICAL RECORD NO.:  [id number]          PATIENT TYPE:  INP   LOCATION:  6529                         FACILITY:  [facility]   PHYSICIAN:  [name], M.D. LHCDATE OF BIRTH:  03/20/1934   DATE OF ADMISSION:  02/14/2005  DATE OF DISCHARGE:  02/17/2005                                 DISCHARGE SUMMARY   PRIMARY CARDIOLOGIST:  [name], M.D.   PRINCIPAL DIAGNOSES:  1.  Coronary artery disease progression.      1.  Normal left ventricular function.  2.  Coronary artery disease.      1.  Status post coronary artery bypass grafting in 2000.      2.  Status post prior stenting (ZOMAXX) right coronary artery.      3.  A 50% ostial left internal mammary artery stenosis with otherwise          patent grafts by cardiac catheterization March 2006.      4.  Nonischemic stress Myoview October 2006.  3.  Chronic Coumadin.      1.  Chronic Coumadin/amiodarone.      2.  Status post radiofrequency ablation.  4.  Hyperlipidemia.  5.  Mild renal insufficiency.   SECONDARY DIAGNOSES:  1.  Type 2 diabetes mellitus.  2.  Hypertension.   PROCEDURE:  Coronary angiography/bare metal stenting 70% ostial LIMA-LAD  graft February 16, 2005.   REASON FOR ADMISSION:  [name] is a 77 year old male, with known coronary  artery disease followed by Dr. Charlies Gross, who presented with symptoms  worrisome for stable angina pectoris.   HOSPITAL COURSE:  Patient was admitted for further evaluation, ruled out for  myocardial infarction with all serial cardiac markers within normal limits.  Mild renal insufficiency was noted (creatinine clearance 44.4).   However, patient's symptoms were worrisome and suggestive of coronary artery  disease progression and recommendation was to proceed with cardiac  catheterization.  Patient was pretreated with hydration for treatment of  mild renal insufficiency  (creatinine clearance 44.4).  Patient was also  on  chronic Coumadin and presented with an INR of 2.9 - he was treated with  vitamin K reversal, per pharmacy protocol.   Patient underwent diagnostic coronary angiography by Dr. Randa Gross (see  report for full details), revealing a 70% ostial LIMA-LAD graft,  successfully treated with bare mental stenting with no associated  complications.   Patient was kept for overnight observation and cleared for discharge the  following morning in hemodynamically stable condition.  Postoperative  cardiac markers were normal.   MEDICATION ADJUSTMENTS THIS ADMISSION:  Addition of Norvasc.  Of note,  patient was on long-term Plavix prior to this admission.  He will resume the  previous regimen of Plavix, low dose aspirin and Coumadin with early protime  follow-up.   LABORATORY DATA:  Hemoglobin 11, hematocrit 32, wbc 4.9, platelets 148.  INR  1.2.  Sodium 140, potassium 3.9, glucose 142, BUN 10, creatinine 1.4.   OUTSTANDING LABORATORIES:  Serial enzymes all within normal limits.  Lipid  profile, total cholesterol 114, triglycerides 88, HDL  34 and LDL 62.  INR  2.9 on admission.  Mildly elevated AST 55/ALT 71 on admission.  Decreased  albumin 3.3.  BNP 334 on admission.   Admission chest x-ray:  No acute findings.   DISCHARGE MEDICATIONS:  1.  Plavix 75 mg daily.  2.  Enteric coated aspirin 81 mg daily.  3.  Coumadin 7.5 mg daily except 10 mg every Tuesday/Sunday.  4.  Norvasc 2.5 mg daily (new).  5.  Lopressor 100 mg b.i.d.  6.  Imdur 30 mg daily.  7.  Glycoside 10 mg daily.  8.  Glucophage 1000 mg b.i.d.  9.  Lisinopril 40 mg daily.  10. Crestor 20 mg daily.  11. Amiodarone 200 mg b.i.d.  12. Lasix 80 mg q.a.m./40 mg q.p.m.  13. K-Dur 20 mEq daily.  14. Flomax 0.4 mg daily.  15. Prilosec 20 mg daily.  16. Nitrostat 0.4 mg as directed.   FOLLOW UP:  1.  [name], M.D./[name], [facility], on March 01, 2005, at 11:15      a.m.  2.  Patient will be rescheduled to follow up  with [name], M.D.,      on March 22, 2005, at 1:45 p.m. for evaluation of intermittent      claudication.  3.  Keep previously scheduled follow-up appointment with the [facility] on Monday at 2:45      p.m.  4.  Resume follow-up with Dr. Tillman Gross, as scheduled.   DURATION OF DISCHARGE ENCOUNTER:  42 minutes.      [name], P.A. [facility]      [name], M.D. [facility]  Electronically Signed    GS/MEDQ  D:  02/17/2005  T:  02/17/2005  Job:  [id number]   cc:   [name], M.D.   [name], M.D. [facility]  [address]  [address]  [place]  [place] [address]

## 2010-06-17 NOTE — Cardiovascular Report (Signed)
NAME:  [name], SCOUTEN NO.:  [id number]   MEDICAL RECORD NO.:  [id number]          PATIENT TYPE:  INP   LOCATION:  4706                         FACILITY:  [facility]   PHYSICIAN:  Arturo Morton. Riley Kill, M.D. [facility] OF BIRTH:  1931/05/14   DATE OF PROCEDURE:  11/25/2003  DATE OF DISCHARGE:                              CARDIAC CATHETERIZATION   INDICATIONS FOR PROCEDURE:  The patient is a 77 year old who presents with  recurrent chest pain status post coronary artery bypass grafting and status  post percutaneous intervention.  Earlier this year, he underwent  intervention to the distal right coronary artery through the previously  placed saphenous vein graft.  A drug eluting stent was placed. He has had  some recurrent symptoms and therefore repeat cardiac catheterization was  ordered by Dr. Juanda Chance.   PROCEDURES:  1.  Left heart catheterization.  2.  Selective coronary arteriography.  3.  Selective left ventriculography.  4.  Saphenous vein graft angiography times two.  5.  Selective left internal mammary artery angiography times one.   DESCRIPTION OF PROCEDURE:  The patient was brought to the catheterization  laboratory and prepped and draped in the usual fashion. Through an anterior  puncture in the right femoral artery it was entered.  There was some  difficulty getting through the femoral artery due to previous scar tissue.  A 6 French sheath was able to be adequately placed in the femoral vessel.  Following this, views of the left and right coronary arteries were obtained.  Central aortic and left ventricular pressures were recorded and  ventriculography performed.  Vein graft angiography was followed by internal  mammary angiography.  The internal mammary demonstrated some damping with  engagement of the left main with the RCA catheter.  This could represent  spasm although ostial disease could not be excluded and therefore multiple  shots just outside the ostium  were obtained.  The procedure was then  completed and I reviewed the old films. I discussed the case with Dr.  Juanda Chance.  Hemostasis was achieved by direct manual compression on the table.   HEMODYNAMIC DATA:  1.  Central aortic pressure is 178/81.  2.  Left ventricular 167/13.  3.  No gradient on pullback across the aortic valve.   ANGIOGRAPHIC DATA:  1.  Ventriculography was performed in the RAO projection.  The overall      systolic function was preserved.  2.  The left anterior descending artery was totally occluded.  3.  The left circumflex coronary artery was totally occluded.  4.  The right coronary artery was totally occluded proximally.  5.  The internal mammary to the distal LAD appeared to be widely patent.      There was vigorous flow throughout this.  There was some damping on the      initial engagement, but this could be related to spasm and it did appear      somewhat better after nitroglycerin was given just outside the ostium.      Flow through this area appeared to be vigorous.  I could not exclude a  30 to 40% lesion at the ostium, however.  6.  The saphenous vein graft to the two circumflex branches appeared to be      widely patent.  The proximal graft has segmental 40% narrowing and there      is about 40 to 50% narrowing at the insertion site in the second      circumflex branch. However, none of this appeared to be flow limiting.  7.  The saphenous vein graft to the distal right is widely patent as is the      stent. The PDA and PLA fill well.  8.  Ventriculography reveals well preserved global systolic function, no      segmental abnormalities or retractions were identified.   CONCLUSION:  1.  Well preserved left ventricular function.  2.  Continued patency of the internal mammary to the left anterior      descending, question ostial spasm.  3.  Continued patency of the saphenous vein graft sequentially with some      modest ostial disease as previously  noted.  4.  Patent saphenous vein graft to the distal right with no evidence of      restenosis at the previous site of Zomax stent insertion.   DISPOSITION:  I have discussed the case with Dr. Juanda Chance.  He may need an  event monitor.  Other than this, he will be seen in the office by Dr.  Juanda Chance.       TDS/MEDQ  D:  11/25/2003  T:  11/25/2003  Job:  [id number]   cc:   Charlies Constable, M.D. [facility]

## 2010-06-17 NOTE — Discharge Summary (Signed)
[place]. [facility]  Patient:    [name], [name]                       MRN: [id number] Adm. Date:  [id number] Disc. Date: 03/20/00 Attending:  Learta Codding Dictator:   Jacolyn Reedy, P.A.C. CC:         Tillman Abide, M.D.   Referring Physician Discharge [facility]  ADMITTING DIAGNOSIS:  Chest pain and palpitations.  DISCHARGE DIAGNOSES: 1. Chest pain, probably secondary to fall at home and recent coronary artery    bypass grafting. 2. Palpitations, probably secondary to premature atrial contractions and    atrial tachycardia. 3. Status post coronary artery bypass grafting x 4 on February 24, 2000. 4. Post coronary artery bypass grafting pneumonia, resolved. 5. Hypertension. 6. Diabetes 7. Hyperlipidemia. 8. Anxiety.  BRIEF HISTORY AND PHYSICAL:  Please see H&P for details.  This is a 77 year old black male patient of Dr. Raylene Miyamoto and Dr. Vassie Moselle who presents to the emergency room for evaluation of chest pain and palpitations. He has a history of recent CABG x 4 on February 24, 2000 and a prior history of atrial flutter ablation.  He has had two falls at home and hit his left chest and is quite sore.  He also saw Dian Queen for an irregular heart beat and wore a Holter monitor on Tuesday.  The results of that showed sinus bradycardia then sinus tachycardia at a rate of 47-115 beats per minute with rate PVCs, occasional PACs, and two atrial runs of atrial tachycardia at a rate of 150.  HOSPITAL COURSE:  The patients chest pain greatly improved but he continued to complain of palpitations and fluttering in his chest that seemed to really bother him.  Dr. Juanda Chance increased his Toprol to 75 mg q.8h. and added Xanax. Since this morning, he says that his fluttering has calmed down a little bit and he seems stable for discharge.  LABORATORY VALUES:  CKs/MBs and troponins negative.  White count 4.9, hemoglobin 11.3, hematocrit 33.7.  Sodium 133, potassium  3.7, chloride 102, CO2 26, BUN 16, creatinine 1.2, glucose 240.  D-dimer was high at 1.77.  Coags were normal.  CT of the chest showed no CT evidence of pulmonary embolus, postoperative changes from recent bypass surgery.  There was a 7 x 2.5 cm complex fluid collection at the base of the heart that may be extracardial pushing in on the pericardium or may be a loculated pericardial effusion or resolving hematoma. No evidence to suggest abscess.  Short-term follow-up CT is recommended to reevaluate this.  There was cardiac enlargement, left pleural effusion with overlying atelectasis, no acute or significant pulmonary problems.  Lower extremity CT showed no evidence for DVT in lower extremities.  Chest x-ray showed cardiac enlargement and persistent left basilar atelectasis with small left effusion.  EKG with normal sinus rhythm, inferior T waves, and inferolateral T wave inversion - no acute change.  DISPOSITION:  Patient will be discharged home in stable condition on the following medications.  DISCHARGE MEDICATIONS: 1. Protonix 40 mg once a day. 2. Pravachol 40 mg once a day. 3. Coated aspirin once a day. 4. Lasix 40 mg once a day. 5. K-Dur 20 mg a day. 6. Toprol-XL 50 mg one-and-a-half q.8h. 7. Xanax 0.25 mg q.8h. p.r.n. 8. Nitroglycerin p.r.n. 9. Glucophage 1000 mg twice a day.  ACTIVITY:  As tolerated.  He is to do no heavy lifting.  DIET:  He is to  follow a low fat diabetic diet.  FOLLOW-UP:  With Dr. Charlies Constable on April 11, 2000 at 2:15 p.m.DD:  03/20/00 TD:  03/20/00 Job: [id number]

## 2010-06-17 NOTE — Discharge Summary (Signed)
NAME:  [name], GOWAN NO.:  [id number]   MEDICAL RECORD NO.:  [id number]          PATIENT TYPE:  INP   LOCATION:  3704                         FACILITY:  [facility]   PHYSICIAN:  Doylene Canning. Ladona Ridgel, M.D.  DATE OF BIRTH:  03/20/1934   DATE OF ADMISSION:  06/30/2005  DATE OF DISCHARGE:  07/01/2005                                 DISCHARGE SUMMARY   PRIMARY CARDIOLOGIST:  Dr. Charlies Constable.   PRIMARY CARE PHYSICIAN:  Dr. Tillman Abide.   PRINCIPAL DIAGNOSIS:  Atypical chest pain.   OTHER DIAGNOSES:  1.  Coronary artery disease.  2.  Hypertension.  3.  Hyperlipidemia.  4.  Type 2 diabetes mellitus.  5.  Benign prostatic hypertrophy.  6.  History of atrial flutter, status post ablation on chronic      anticoagulation.  7.  Chronic lower extremity edema and claudication with peripheral vascular      disease.  8.  History of elevated LFTs  9.  Renal insufficiency.   ALLERGIES:  SULFA.   PROCEDURES:  None.   HISTORY OF PRESENT ILLNESS:  A 77 year old African-American male with prior  history of CAD status post cabbage who presented to the emergency room on  June 30, 2005 with a 1-week history of episodic sharp left-sided chest pain  that is reproducible with movement of the left arm lasting only a few  seconds and resolving spontaneously.  He was admitted for further  evaluation.   HOSPITAL COURSE:  Mr. [name] has had four sets of negative cardiac markers.  He continues to have mild sharp discomfort in his left chest and arm when  moving the left arm.  He is otherwise comfortable.  The decision has been  made to discharge him home today and he will follow up outpatient adenosine  Myoview in our office in approximately 1 week.  He will be contacted with  this appointment and otherwise he will see Dr. Juanda Chance in approximately 2  weeks.   DISCHARGE LABORATORY:  Hemoglobin 11.6, hematocrit 34.0, WBC 4.8, platelets  143.  PT 33.1, INR 3.2, PTT 39.  Sodium 139,  potassium 4.2, chloride 108,  BUN 14, creatinine 1.6, glucose 102.  CK 214, MB 2.5, troponin I 0.02.   DISPOSITION:  Patient being discharged home today in good condition.   FOLLOWUP PLANS AND APPOINTMENTS:  We will arrange a followup appointment  with Dr. Juanda Chance in approximately 2 weeks.  He will be contacted by our  office for an adenosine Myoview in the coming week.  He is asked to follow  up with his primary care physician Dr. Alphonsus Sias in the next 2 weeks.   DISCHARGE MEDICATIONS:  1.  Aspirin 81 mg every day.  2.  Coumadin as previously prescribed.  3.  Glipizide 10 mg every day.  4.  Glucophage 1000 mg b.i.d.  5.  Imdur 30 mg every day.  6.  Lasix 80 mg in the a.m. 40 mg in the p.m.  7.  Norvasc 2.5 mg every day.  8.  Plavix 75 mg every day.  9.  Zetia 10 mg every day.  10. Potassium as previously prescribed.  11. Nitroglycerin 0.4 mg sublingual p.r.n. chest pain.   OUTSTANDING LABORATORY STUDIES:  None.   Duration of discharge encounter 30 minutes including physician time.      Ok Anis, NP    ______________________________  Doylene Canning. Ladona Ridgel, M.D.    CRB/MEDQ  D:  07/01/2005  T:  07/01/2005  Job:  [id number]   cc:   Varney Baas, MD

## 2010-06-17 NOTE — Discharge Summary (Signed)
NAME:  [name], [name] NO.:  [id number]   MEDICAL RECORD NO.:  [id number]                   PATIENT TYPE:  OIB   LOCATION:  6523                                 FACILITY:  [facility]   PHYSICIAN:  Charlies Constable, M.D. [facility]              DATE OF BIRTH:  09-Nov-1931   DATE OF ADMISSION:  07/31/2003  DATE OF DISCHARGE:  08/01/2003                                 DISCHARGE SUMMARY   DISCHARGE DIAGNOSES:  1. Coronary artery disease.     a. Status post coronary artery bypass graft.     b. Status post Zomax II stent to the native right coronary artery this        admission.  2. Preserved left ventricular function with an ejection fraction of 60%.  3. Diabetes mellitus type 2.  4. Hypertension.  5. Treated dyslipidemia.  6. Sinus bradycardia.  7. History of atrial flutter, status post ablation.  8. History of orchiectomy.   PROCEDURES PERFORMED THIS ADMISSION:  Percutaneous coronary intervention by  Charlies Constable, M.D., with stenting of the RCA as noted above.   HOSPITAL COURSE:  Please see office note from July 16, 2003, for complete  details.  Briefly, the patient is a 77 year old male patient followed by Dr.  Juanda Chance, who had recently had some atypical chest discomfort.  He was set up  for a Cardiolite scan that was positive for ischemia in the anterior wall  and inferior wall.  He underwent cardiac catheterization by Dr. Juanda Chance on  June 24.  This revealed severe native three-vessel disease with total  occlusion of the LAD, circumflex, and RCA, patent vein graft to the distal  RCA, marginal posterior lateral branch of the circumflex, and patent LIMA to  the LAD.  He had distal 90% stenosis in the RCA after graft insertion and  total occlusion fo the LAD near the apex.  His EF was 60%.  He was brought  back in for percutaneous coronary intervention on July 31, 2003.  As noted  above, he underwent Zomax II stent to the native RCA.  He tolerated the  procedure well  and had no immediate complications.  On the morning of August 01, 2003, he was found to be in stable condition.  His groin was sealed with  an Angioseal.  No obvious hematomas or bruits were noted.  His postprocedure  CK-MBs were negative.  He will need to be on Plavix for one year and aspirin  indefinitely.  He was enrolled in the [facility] trial.  He will need follow-up  with Dr. Juanda Chance in the next several weeks.   LABORATORY DATA:  Hemoglobin 12.6, hematocrit 36.8, platelet count 163,000,  white count 5900.  Sodium 136, potassium 3.8, BUN 18, creatinine 1.3,  glucose 282.   DISCHARGE MEDICATIONS:  1. Plavix 75 mg daily for one year.  2. Coated aspirin 325 mg daily.  3. Multivitamin daily.  4.  Glipizide ER 10 mg daily.  5. Glucophage 100 mg b.i.d., the patient knows to restart this on Monday,     August 03, 2003.  6. Metoprolol 75 mg three times a day.  7. Lisinopril/HCT 20/25 mg daily.  8. Lovastatin 40 mg daily.  9. Prilosec OTC daily.  10.      Methocarbamol as taken previously.  11.      Nitroglycerin as needed for chest pain.   PAIN MANAGEMENT:  Tylenol as needed.  Nitroglycerin as needed for chest  pain.  He is to call our office of 911 with recurrent chest pain.   ACTIVITY:  No driving, heavy lifting, exertion, sex, or work for three days.   DIET:  Low-fat, low-sodium diabetic workup.   WOUND CARE:  The patient is to call our office in [facility] for any groin  swelling, bleeding, or bruising.   FOLLOW-UP:  The patient will follow up with Dr. Juanda Chance in the next several  weeks.  The office will contact him with an appointment.  If we have  difficulty getting him in to see Dr. Juanda Chance, then he will see the P.A. in  the next couple of weeks with follow-up with Dr. Juanda Chance after that.   SPECIAL NOTE:  The patient was started on the [facility] trial, which is testing  the Zomax stent versus the Taxus stent.      Tereso Newcomer, P.A.                        Charlies Constable, M.D. [facility]     SW/MEDQ  D:  08/01/2003  T:  08/01/2003  Job:  [id number]   cc:   Dr. Lavonda Jumbo, M.D. [facility]
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
## 2010-06-17 NOTE — Op Note (Signed)
[place]  Patient:    [name], [name]                       MRN: [id number] Proc. Date: 02/24/00 Adm. Date:  [id number] Attending:  Charlett Lango CC:         Madolyn Frieze. Jens Som, M.D. [facility]  Dr. Dierdre Searles, Murray Cowan   Operative Report  PREOPERATIVE DIAGNOSIS:  Three-vessel coronary disease with atypical angina.  POSTOPERATIVE DIAGNOSIS:  Three-vessel coronary disease with atypical angina.  PROCEDURES:  Median sternotomy, off-pump coronary artery bypass grafting x 4 (OPCAB), left internal mammary artery to the left anterior descending coronary artery, saphenous vein graft to the distal right coronary, sequential saphenous vein graft to obtuse marginal #2 and 3).  SURGEON:  Salvatore Decent. Dorris Fetch, M.D.  SURGEONS:  Salvatore Decent. Cornelius Moras, M.D., and Maxwell Marion, R.N.F.A.  ANESTHESIA:  General.  FINDINGS:  Good-quality targets, good-quality conduits.  Left ventricular hypertrophy.  Preserved left ventricular function.  Transient ST elevations during performance of proximal anastomoses, resolved with nitroglycerin.  CLINICAL NOTE:  [name] is a 77 year old gentleman with a prior history of coronary disease and noninsulin-dependent diabetes.  He presented with atypical chest pain and really had several different chest pain syndromes.  It was unclear if this was angina or not.  He underwent a Cardiolite study, which showed ischemia.  He then underwent cardiac catheterization, which showed progression of three-vessel coronary disease, with a total occlusion of his right coronary, which was chronic but filling via right-to-right collaterals, as well as a 70% lesion in the LAD, a 70% lesion at the ostium of OM2, and a 70% circumflex lesion beyond the takeoff of OM2.  Overall left ventricular function was preserved with an ejection fraction of greater than 50%.  The patient was referred for coronary artery bypass grafting.  The  indications, risks, benefits, and alternative treatments were discussed in detail with the patient, as well as the possibility of proceeding with off-pump grafting and the relative advantages and disadvantages of that approach.  The patient understood and accepted the risks and agreed to proceed.  DESCRIPTION OF PROCEDURE:  [name] was brought to the preoperative holding area on 02/24/00.  Lines were placed to monitor arterial, central venous, and pulmonary arterial pressure.  EKG leads were placed for continuous telemetry. The patient was taken to the operating room, anesthetized, and intubated.  A Foley catheter was placed, intravenous antibiotics were administered.  The chest, abdomen, and legs were prepped and draped in the usual fashion.  A median sternotomy was performed, and the left internal mammary artery was harvested in the standard fashion.  The mammary was a 2 mm, good-quality conduit.  Simultaneously, an incision was made in the medial aspect of the right leg, and the greater saphenous vein was harvested from the ankle to the knee.  This also was a good-quality vein.  After harvesting the conduits, the patient was given 20,000 units of heparin before dividing the distal end of the mammary artery.  Initially there was sluggish flow through the end of the mammary artery, and there appeared to be spasm in the vessel.  A 1.5 mm probe as gently passed retrograde in the mammary artery, and then there was excellent flow through the graft.  The mammary artery was placed in a papaverine-soaked sponge and placed into the left pleural space.  The pericardium was opened.  The coronary arteries were inspected.  Through this, the patient was placed in steep  Trendelenburg position and rotated to the right side.  The coronaries were very briefly inspected, and anastomotic sites were chosen so the conduits could be cut to length.  The exposed off-pump bypass system was used for  stabilization of the heart during performance of the distal anastomoses.  The suction device was placed on the apex of the heart and was directed upward and cephalad, exposing the distal right coronary.  The stabilizer then was placed at the site chosen for anastomosis.  An arteriotomy was performed in the distal right coronary.  This was a 1.5 mm, good-quality target.  The saphenous vein was of good quality. There was difficulty passing a probe because of kinking in the vessel, and the bleeding was not excessive; therefore, the anastomosis was performed without a shunt in place.  The patient had no hemodynamic or ST changes suggestive of ischemia during the performance of the anastomosis.  The anastomosis was performed with running 7-0 Prolene suture.  It was probed proximally and distally with a 1.5 mm probe to ensure patency.  There was excellent flow through the graft and good hemostasis.  Next, the apex of the heart was retracted to the right, exposing the posterolateral wall.  The stabilizer was placed at OM2 at the site chosen for anastomosis.  Again the patient tolerated this well hemodynamically after repositioning the stabilizer.  An arteriotomy was performed.  OM2 was a 1.5 mm, good-quality vessel.  A 1.5 mm shunt was placed, and there was excellent visualization and stabilization.  A side-to-side anastomosis was performed off a good-quality saphenous vein graft using a running 7-0 Prolene suture.  After completion of the anastomosis, the shunt was removed.  There was good back-bleeding from the anastomosis, and the graft flushed easily. The vein graft then was cut to an appropriate length to reach OM3.  The stabilizer was replaced, and arteriotomy was performed.  It was lengthened proximally and distally, and again a 1.5 mm shunt was placed, again with good hemostasis and visualization.  This anastomosis was end-to-side with a running 7-0 Prolene suture.  Again, as the shunt  was removed, there was good back-bleeding, and there was excellent flow through the graft.  There was god  hemostasis at both anastomoses.  The patient tolerated this entire portion of the procedure without any evidence of ischemia or hemodynamic deterioration.  Next, the left internal mammary artery was brought through a window in the pericardium anterior to the left phrenic nerve.  The distal end of the mammary was spatulated.  The LAD was stabilized just past the takeoff of the large diagonal branch in a relatively straight portion of the vessel.  The LAD was a 2 mm vessel.  An arteriotomy was performed, and a 1.5 mm shunt was placed within the artery.  There was excellent exposure and stabilization.  A _____ was used for additional visualization.  The end-to-side anastomosis was performed with a running 8-0 Prolene suture.  The mammary and LAD were both good-quality vessels.  At the completion of the mammary-to-LAD anastomosis, the shunt was removed and the suture was tied, the bulldog clamp was removed from the mammary artery.  There was good hemostasis at the anastomosis.  The mammary pedicle was tacked to the epicardial surface of the heart with 6-0 Prolene sutures.  Again, the patient tolerated this anastomosis without any ST changes or evidence of ischemia.  A partial occlusion clamp then was placed on the ascending aorta.  This was done sequentially with each diastole, slowly partially  occluding the aorta. The patient did develop some ST elevations at this point in the procedure, which resolved with intravenous nitroglycerin.  The vein grafts were cut to length, and the proximal vein graft anastomoses were performed to 4.4 mm punch aortotomies with running 6-0 Prolene sutures.  At the completion of the final proximal anastomosis, the patient remained in Trendelenburg position.  Air was allowed to vent as the partial clamp was removed.  Air was aspirated from each of the vein  grafts, bulldog clamps were removed, and flow was restored.  All proximal and distal anastomoses were inspected for hemostasis.  Epicardial pacing wires were placed on the right ventricle and right atrium.  Attempts were then made to keep the patient warm with a bear hugger over the legs once the leg wound was closed and with keeping the room temperature elevated.  His core temperature despite this did get as low as 35 degrees Celsius, where it stabilized.  At this point, a test dose of protamine was administered.  It was well-tolerated.  The remainder of the calculated protamine dose was given to neutralize the initial heparin dose.  This was tolerated without any hemodynamic incident.  As stated previously, the ST changes had resolved with nitroglycerin, and there were no further ST changes or hemodynamic instability throughout the remaining portion of the procedure.  The patient did have a cardiac index of 1.5 L/min. per sq. m initially after performance of the anastomoses, but with volume resuscitation the cardiac index improved to almost 3 L/min. per sq. m, and the patient remained hemodynamically stable thereafter.  Hemostasis was achieved.  The chest was irrigated with 1 L of normal saline containing 1 g of vancomycin.  The pericardium was reapproximated with interrupted 3-0 silk sutures.  It came together easily without tension and without kinking of the underlying vein grafts.  A left pleural and two mediastinal chest tubes were placed through separate subcostal incisions, and the sternum was closed with heavy-gauge interrupted stainless steel wires.  The pectoralis fascia was closed with a running #1 Vicryl suture.  Subcutaneous tissue was closed with a running 2-0 Vicryl suture, and the skin was closed with 3-0 Vicryl subcuticular suture.  All sponge, needle, and instrument counts were correct at the end of the procedure.  The patient tolerated the procedure well and was taken  from the operating room to the surgical intensive care unit intubated, in stable condition. DD:  02/24/00 TD:  02/25/00 Job: [id number]

## 2010-06-17 NOTE — Cardiovascular Report (Signed)
[place]. [facility]  Patient:    [name], [name]                       MRN: [id number] Proc. Date: 07/18/99 Adm. Date:  [id number] Attending:  Lenoria Farrier CC:         Dr. _______             [name]. Juanda Chance, M.D. [facility]             Cardiopulmonary Laboratory                        Cardiac Catheterization  CLINICAL HISTORY:  [name] is 77 years old and has had three interventions in the circumflex artery and a stent placed in the LAD in the remote past and most last studied in 1999, at which time these interventional sites were patent.  He recently was admitted with recurrent paroxysmal atrial flutter associated with chest pain.  His CK-MBs were borderline positive at 6.3 with a troponin of 0.19 and he was scheduled for evaluation with cardiac catheterization.  DESCRIPTION OF PROCEDURE:  The procedure was performed via the right femoral artery using an arterial sheath and 6 French preformed coronary catheters.  A front wall arterial puncture was performed and Omnipaque contrast was used.  A distal aortogram was performed to rule out abdominal aortic aneurysm.  The patient tolerated the procedure well and left the laboratory in satisfactory condition.  RESULTS:  The left main coronary artery:  The left main coronary artery was free of significant disease.  Left anterior descending:  The left anterior descending artery was irregular. There was less than 30% narrowing in the proximal LAD at the previous stent site.  The LAD gave rise to two diagonal branches and three septal perforators.  There was 50% narrowing in the midportion of the vessel and diffuse irregularities but no major obstruction.  Circumflex artery:  The circumflex artery gave rise to a small intermediate branch, a large marginal and a moderately large posterolateral branch.  There was 50-70% ostial stenosis and tandem 40% proximal stenoses in the circumflex artery.  Right  coronary artery:  The right coronary artery was completely occluded near its origin.  There were bridging collaterals which failed the mid and distal right coronary artery and there were also collaterals from the conus branch filling the distal right coronary artery.  The distal right coronary artery consists of a posterior descending and a small posterolateral branch.  LEFT VENTRICULOGRAPHY:  The left ventriculogram was performed in the RAO projection showed good wall motion with no areas of hypokinesis.  The estimated ejection fraction was 60%.  DISTAL AORTOGRAM:  A distal aortogram was performed which showed no renal artery stenosis and no significant aortoiliac obstruction.  CONCLUSIONS:  Coronary artery disease, status post three percutaneous coronary interventions on the circumflex artery and status post previous stenting of the left anterior descending in the remote past, with less than 30% narrowing at the stent site in the proximal LAD and 50% narrowing in the mid LAD, 50-70% stenosis in the ostium of the circumflex artery with 40% narrowing in the midportion of the vessel, and total occlusion of the right coronary artery with filling of the distal vessel via bridging collaterals with good LV function.  RECOMMENDATIONS:  The patient certainly has potential ischemia in the distribution of the right coronary artery.  The lesions on the left side do not appear to  be flow-limiting.  The mild elevation of his cardiac enzymes on admission may be related to increased myocardial demands from his atrial flutter associated with his total occlusion of the right coronary artery which still appears to be supplying viable territory.  I think medical therapy is indicated for treatment of his coronary disease and Dr. Ladona Gross plans an attempt at flutter ablation tomorrow.  ADDENDUM:  The aortic pressure is 160/69 with a mean of 100.  Left ventricular pressure 160/34. DD:  07/18/99 TD:   07/20/99 Job: [id number]

## 2010-06-17 NOTE — Discharge Summary (Signed)
NAME:  [name], [name] NO.:  [id number]   MEDICAL RECORD NO.:  [id number]          PATIENT TYPE:  INP   LOCATION:  4706                         FACILITY:  [facility]   PHYSICIAN:  Charlies Constable, M.D. [facility] DATE OF BIRTH:  [birth year]   DATE OF ADMISSION:  11/24/2003  DATE OF DISCHARGE:  11/26/2003                           DISCHARGE SUMMARY - REFERRING   DISCHARGE DIAGNOSES:  1.  Chest pain.  2.  Known coronary artery disease, status post coronary artery bypass graft.  3.  History of medical noncompliance.  4.  Diabetes mellitus on oral agent.  5.  Hypertension.   HOSPITAL COURSE:  [name] was hospitalized on November 24, 2003, with  chest pain concerning for unstable angina.  The patient underwent cardiac  catheterization and was found to have patent SVG to OM-1 and OM-2.  There  was a 40% osteal lesion, although this was nonobstructive.  The LIMA to the  LAD was patent, but had diffuse, distal disease of the native vessel.  The  saphenous vein graft to the right coronary artery is patent.  Because of the  severe disease in the native LAD, there is a question of this area being the  source of ischemia.  We planned medical treatment.  The patient does have a  history of atrial arrhythmias.  We did go ahead and plan an event monitor  after discharge.   On November 26, 2003, the patient was ready for discharge to home.   DISCHARGE MEDICATIONS:  1.  Lopressor 100 mg 1/2 tablet b.i.d.  2.  Resume Glucophage on November 28, 2003.  3.  Stop Toprol.  4.  Imdur 30 mg a day.  5.  Lovastatin 40 mg q.h.s.  6.  Glipizide 10 mg a day.  7.  Plavix 75 mg a day.  8.  Lisinopril/HCTZ 20/25 one tablet daily.  9.  Prilosec daily.  10. Multivitamin daily.  11. Sublingual nitroglycerin for chest pain.   ACTIVITY:  No strenuous lifting over 10 pounds x1 week.   WOUND CARE:  Clean catheter site with warm soap and water, no scrubbing.   FOLLOW UP:  After discharge today, the patient  is to go to the office for an  event monitor and followup on December 11, 2003, at 3:30 p.m. with [name], P.A. for Dr. Juanda Chance.       LB/MEDQ  D:  11/26/2003  T:  11/26/2003  Job:  [id number]

## 2010-06-17 NOTE — Discharge Summary (Signed)
[place]. [facility]  Patient:    [name], [name]                       MRN: [id number] Adm. Date:  [id number] Disc. Date: 02/29/00 Attending:  Charlett Lango Dictator:   Loura Pardon, P.A. CC:         Dr. Tillman Abide, [place], [place]  Bruce R. Juanda Chance, M.D. [facility]  Doylene Canning. Ladona Ridgel, M.D. [facility]   Discharge Summary  DISCHARGE DIAGNOSES: 1. Three-vessel atherosclerotic coronary artery disease with atypical angina. 2. Right lower lobe pneumonia, developing postoperative day #3 and treated    with antibiotics and Tequin for eight-day period. 3. Hyperlipidemia. 4. Type 2 diabetes. 5. History of atrial flutter, status post radiofrequency ablation by Dr. Lewayne Bunting. 6. Gastroesophageal reflux disease. 7. History of atherosclerotic coronary artery disease, status post    percutaneous transluminal coronary angioplasty x 3 with stenting of the    left anterior descending.  PROCEDURES: 1. Cardiolite stress test February 20, 2000, aborted secondary to chest pain    and ST depressions in the lateral leads. 2. On February 21, 2000, left heart catheterization with left ventriculogram    and coronary angiography by Dr. Charlton Haws.  The study demonstrated a    left main coronary artery with a 30% discrete stenosis.  Left anterior    descending coronary artery had a diffuse 30% end-stent restenosis.  Distal    to the stent was a 70% multiple tubular mid vessel lesion.  The distal left    anterior descending was subtotally occluded.  Diagonal branches were the    left anterior descending were small and diffusely diseased.  The left    circumflex had a 50-60% osteal lesion.  There was a large branching first    obtuse marginal which had a 70% bifurcation lesion, mid circumflex 50%    multiple discrete lesions, 70% distal branch lesion in the first obtuse    marginal branch.  The right coronary artery was 100% occluded proximally.    There was a 50% stenosis prior to  the conus collateral branches which were    supplying the mid and distal right coronary artery.  Ejection fraction 55%    with no mitral regurgitation.  Minimal inferior wall hypokinesis. 3. Carotid duplex ultrasound on February 23, 2000, negative for internal    carotid artery stenosis bilaterally.  Ankle brachial indexes greater than    1.0 bilaterally. 4. On February 24, 2000, coronary artery bypass graft surgery x 4, off pump, by    Dr. Dorris Fetch.  In this procedure, the left internal mammary artery was    connected in an end-to-side fashion to the left anterior descending    coronary artery.  A reverse saphenous vein graft was fashioned from the    aorta sequentially to the first obtuse marginal and then to the second    obtuse marginal and a reverse saphenous vein graft was fashioned from the    aorta to the distal right coronary artery.  The patient was transferred in    stable, satisfactory condition to the intensive care unit.  He did not    receive blood products or inotropic support.  DISPOSITION:  [name] is ready for discharge on postop day #5.  He has had one slight fever on the evening of postop day #3.  He did have some productive sputum and x-ray was read as right lower lobe infiltrate/pneumonia.  He was started  on oral Tequin and sputum was cultured.  He had a short burst of eight beats of atrial fibrillation on postop day #2 which this converted quickly to a sinus rhythm.  He was relieved of all supplemental oxygen by postop day #2. He is ambulating independently.  His mental status was clear in the postop period.  He is taking oral nourishment well.  His incisions are healing nicely.  DISCHARGE MEDICATIONS: 1. Percocet 5/325 one to two tablets p.o. q.4-6h. p.r.n. pain. 2. Pravachol 40 mg p.o. b.i.d. 3. Lopressor 50 mg 1/2 tablet t.i.d. 4. Enteric coated aspirin 325 mg daily. 5. Glucophage 500 mg two tablets b.i.d. 6. Tequin 400 mg q.d. x 8 days.  ACTIVITY:   Ambulation as tolerated.  He is asked not to lift more than 10 pounds and do not drive for the next six weeks.  DIET:  Low sodium, low cholesterol, ADA diet.  WOUND CARE:  He may shower daily keeping his incisions clean and dry.  FOLLOWUP:  Office visit with Dr. Juanda Chance two weeks from discharge.  He is asked to call his office to make appointment.  Chest x-ray will be taken at that time.  Follow-up appointment with Dr. Charlett Lango on Wednesday, March 16, 2000, at 11:45 a.m.  HISTORY OF PRESENT ILLNESS:  [name] is a 77 year old, African-American male with a history of type 2 diabetes mellitus, hyperlipidemia, radiofrequency ablation of atrial flutter last Summer as well as known coronary artery disease with prior left anterior descending coronary artery stenting.  His last catheterization was July 01, 1999.  He was admitted February 18, 2000, with atypical chest pain and ruled out for a myocardial infarction.  He was placed on Lovenox anticoagulation prophylaxis.  HOSPITAL COURSE:  After admission on February 18, 2000, with atypical chest pain and negative elevations in cardiac enzymes, he was scheduled for Cardiolite stress test on January 21.  This study was stopped prior to chest burning as well as ST depressions in the lateral leads.  He was then scheduled for left heart catheterization and this was done on January 22.  The study demonstrated severe three-vessel coronary artery disease as dictated above. He was seen January 23, by Dr. Charlett Lango of the cardiovascular and thoracic surgery service of [facility], who recommended revascularization surgery as his best option.  The patient agreed to undergo the procedure understanding the risks and benefits of this procedure.  The procedure was done off pump on 02/24/00.  Four bypasses were placed by Dr. Charlett Lango.  He was extubated on the date of surgery.  He was achieving 100% oxygen saturations on 2  L of nasal cannula on postop day #1.  By postop day #2, he was relieved of supplemental oxygen.  He had a short burst of atrial fibrillation of eight beats and then returned to a sinus rhythm which he has maintained ever since.  On postop day #3, chest x-ray showed infiltrates in the right lower lobe consistent with pneumonia/aspiration.  White count was normal.  He just had a slight fever on the evening of postop day #3 of 101. He remained afebrile on postop day #4, however, he was producing some sputum and this was cultured.  He was then started on Tequin orally for a nine-day course.  Repeat x-ray was scheduled for the morning of February 29, 2000, as well as a complete blood count.  His incisions were healing nicely and mental status remained clear.  He was ambulating independently and taking oral  nourishment well.  He was ready for discharge on postop day #5, to take Tequin. DD:  02/28/00 TD:  02/29/00 Job: [id number]

## 2010-06-17 NOTE — Patient Instructions (Addendum)
Please set up a protime on Monday to be sure the antibiotic isn't throwing it off

## 2010-06-19 ENCOUNTER — Encounter: Payer: Self-pay | Admitting: Cardiovascular Disease

## 2010-06-20 ENCOUNTER — Ambulatory Visit: Payer: No Typology Code available for payment source

## 2010-06-20 ENCOUNTER — Encounter: Payer: Self-pay | Admitting: Cardiovascular Disease

## 2010-06-21 ENCOUNTER — Ambulatory Visit (INDEPENDENT_AMBULATORY_CARE_PROVIDER_SITE_OTHER): Payer: No Typology Code available for payment source | Admitting: Internal Medicine

## 2010-06-21 ENCOUNTER — Telehealth: Payer: Self-pay | Admitting: *Deleted

## 2010-06-21 DIAGNOSIS — Z7901 Long term (current) use of anticoagulants: Secondary | ICD-10-CM

## 2010-06-21 DIAGNOSIS — I82409 Acute embolism and thrombosis of unspecified deep veins of unspecified lower extremity: Secondary | ICD-10-CM

## 2010-06-21 DIAGNOSIS — Z5181 Encounter for therapeutic drug level monitoring: Secondary | ICD-10-CM

## 2010-06-21 MED ORDER — ISOSORBIDE MONONITRATE ER 30 MG PO TB24: 30.0000 mg | ORAL_TABLET | Freq: Two times a day (BID) | ORAL | Status: DC

## 2010-06-21 NOTE — Telephone Encounter (Signed)
Spoke to pt and notified him of his echo results per Dr. Windell Hummingbird letter: The echocardiogram shows only mild abnormalities with otherwise good heart function. There is normal pressures in the heart suggesting the edema in the legs is not coming from the heart and not from fluid overload.   The edema could be from the amlodipine. I recommend we stop this medication to see if the edema improves. To keep the blood pressure stable, we will need to make a medication change. You could increase the isosorbide to one in the Am and PM and monitor the blood pressure to make sure that it dose not go too high.   Pt will stop his Amlodipine and increase Isosorbide Mononitrate 30mg  to b.i.d. New rx sent to his pharmacy. Pt will monitor his BP for about a week after change and will call office with any concerns. Pt states that he did have a pain in left side of chest this AM that lasted for about and states this has been happening occasionally lately. Pt did not take NTG SL, notified pt if this occurs again to take his NTG per protocol and if pain continues after 3 doses to call 911 but if it resolves after 1 or 2 to call our office. Pt states he will do this.

## 2010-06-21 NOTE — Patient Instructions (Signed)
Continue current dose and return in 4 weeks.  

## 2010-06-22 NOTE — Telephone Encounter (Signed)
Notified pt of msg below. Pt states he would like to have stress test, he will try to do treadmill myoview. Will contact pt with appt date and time and give instructions.

## 2010-06-22 NOTE — Telephone Encounter (Signed)
It has been years since his last stress test. He has CABG. Does he want a stress test to male sure he does not have blockage as a cause of his chest pain? Would do at [facility]

## 2010-06-23 ENCOUNTER — Other Ambulatory Visit: Payer: Self-pay | Admitting: Cardiovascular Disease

## 2010-06-23 DIAGNOSIS — Z951 Presence of aortocoronary bypass graft: Secondary | ICD-10-CM

## 2010-06-23 NOTE — Telephone Encounter (Signed)
Pt scheduled to have myoview 06/27/10 @ 0830  @ [facility]. Pt given verbal instructions to be NPO after MN, hold insulins/diabetic meds AM of procedure, and to hold metoprolol evening prior and AM of procedure. Pt will call office with any problems in the meantime.

## 2010-06-27 ENCOUNTER — Encounter: Payer: Self-pay | Admitting: Cardiovascular Disease

## 2010-06-27 DIAGNOSIS — R079 Chest pain, unspecified: Secondary | ICD-10-CM

## 2010-06-28 ENCOUNTER — Ambulatory Visit: Payer: PRIVATE HEALTH INSURANCE

## 2010-06-28 ENCOUNTER — Encounter: Payer: Self-pay | Admitting: Cardiovascular Disease

## 2010-06-29 ENCOUNTER — Telehealth: Payer: Self-pay | Admitting: *Deleted

## 2010-06-29 NOTE — Telephone Encounter (Signed)
Notified pt that his myoview was normal per Dr. Mariah Milling. Pt will f/u 07/04/10.

## 2010-07-04 ENCOUNTER — Ambulatory Visit: Payer: PRIVATE HEALTH INSURANCE | Admitting: Cardiovascular Disease

## 2010-07-05 ENCOUNTER — Telehealth: Payer: Self-pay | Admitting: *Deleted

## 2010-07-06 ENCOUNTER — Encounter: Payer: Self-pay | Admitting: Internal Medicine

## 2010-07-06 ENCOUNTER — Ambulatory Visit (INDEPENDENT_AMBULATORY_CARE_PROVIDER_SITE_OTHER): Payer: No Typology Code available for payment source | Admitting: Internal Medicine

## 2010-07-06 VITALS — BP 138/60 | HR 60 | Temp 97.9°F | Ht 70.0 in | Wt 234.0 lb

## 2010-07-06 DIAGNOSIS — I25810 Atherosclerosis of coronary artery bypass graft(s) without angina pectoris: Secondary | ICD-10-CM

## 2010-07-06 DIAGNOSIS — E119 Type 2 diabetes mellitus without complications: Secondary | ICD-10-CM

## 2010-07-06 DIAGNOSIS — E7800 Pure hypercholesterolemia, unspecified: Secondary | ICD-10-CM

## 2010-07-06 DIAGNOSIS — I509 Heart failure, unspecified: Secondary | ICD-10-CM

## 2010-07-06 DIAGNOSIS — S161XXA Strain of muscle, fascia and tendon at neck level, initial encounter: Secondary | ICD-10-CM | POA: Insufficient documentation

## 2010-07-06 DIAGNOSIS — I5032 Chronic diastolic (congestive) heart failure: Secondary | ICD-10-CM

## 2010-07-06 DIAGNOSIS — S139XXA Sprain of joints and ligaments of unspecified parts of neck, initial encounter: Secondary | ICD-10-CM

## 2010-07-06 LAB — HEPATIC FUNCTION PANEL
ALT: 83 U/L — ABNORMAL HIGH (ref 0–53)
AST: 82 U/L — ABNORMAL HIGH (ref 0–37)
Albumin: 3.6 g/dL (ref 3.5–5.2)

## 2010-07-06 LAB — CBC WITH DIFFERENTIAL/PLATELET
Eosinophils Relative: 1.4 % (ref 0.0–5.0)
MCV: 85.6 fl (ref 78.0–100.0)
Monocytes Absolute: 0.6 10*3/uL (ref 0.1–1.0)
Monocytes Relative: 10.6 % (ref 3.0–12.0)
Neutrophils Relative %: 65.6 % (ref 43.0–77.0)
Platelets: 132 10*3/uL — ABNORMAL LOW (ref 150.0–400.0)
WBC: 5.3 10*3/uL (ref 4.5–10.5)

## 2010-07-06 LAB — BASIC METABOLIC PANEL
BUN: 28 mg/dL — ABNORMAL HIGH (ref 6–23)
CO2: 27 mEq/L (ref 19–32)
Chloride: 106 mEq/L (ref 96–112)
Glucose, Bld: 252 mg/dL — ABNORMAL HIGH (ref 70–99)
Potassium: 4.7 mEq/L (ref 3.5–5.1)

## 2010-07-06 LAB — LIPID PANEL: Cholesterol: 163 mg/dL (ref 0–200)

## 2010-07-06 LAB — HEMOGLOBIN A1C: Hgb A1c MFr Bld: 10.6 % — ABNORMAL HIGH (ref 4.6–6.5)

## 2010-07-06 MED ORDER — TRAMADOL HCL 50 MG PO TABS: ORAL_TABLET | ORAL | Status: DC

## 2010-07-06 NOTE — Assessment & Plan Note (Signed)
Lab Results  Component Value Date   LDLCALC 119* 12/31/2009   Will recheck labs

## 2010-07-06 NOTE — Assessment & Plan Note (Signed)
Chronic chest pain that doesn't seem ischemic No changes in Rx

## 2010-07-06 NOTE — Assessment & Plan Note (Signed)
Mild chronic dyspnea Seems to be stable fluid status

## 2010-07-06 NOTE — Patient Instructions (Signed)
Cancel appt on June 27th Please try heat and the tramadol for your neck pain. The tramadol pain pill may help your groin as well

## 2010-07-06 NOTE — Progress Notes (Signed)
Subjective:    Patient ID: [name], male    DOB: [birth year], 77 y.o.   MRN: [id number]  HPI Still having some groin pain but hear for neck pain Has a lot of soreness and lump in right posterior neck Noticed the pain and lump 3 days ago Has kept him from sleeping  No fever Pain is fairly constant Worse with movement and neck flexion/extension Tried an ice pack and alcohol without effect  Sugars are much better Hasn't needed extra insulin much Mostly under 140--only once over 300  Heart is fine Does have some chronic "hurting" on his left side Breathing is "off a little"  Current Outpatient Prescriptions on File Prior to Visit  Medication Sig Dispense Refill  . acetaminophen (TYLENOL) 500 MG tablet Take 500 mg by mouth every 6 (six) hours as needed.        Marland Kitchen amiodarone (PACERONE) 200 MG tablet Take 200 mg by mouth daily.        Marland Kitchen aspirin 81 MG tablet Take 81 mg by mouth daily.        . clopidogrel (PLAVIX) 75 MG tablet Take 75 mg by mouth daily.        . fluticasone (FLONASE) 50 MCG/ACT nasal spray 2 sprays by Nasal route as needed.        . furosemide (LASIX) 80 MG tablet Take 80 mg by mouth 2 (two) times daily.        Marland Kitchen glipiZIDE (GLUCOTROL) 10 MG tablet Take 10 mg by mouth daily.        Marland Kitchen glucose blood (BAYER CONTOUR TEST) test strip Use as instructed, check blood sugar three times daily       . insulin glargine (LANTUS) 100 UNIT/ML injection Inject 10 Units into the skin at bedtime.        . Insulin Pen Needle (PEN NEEDLES 5/16") 30G X 8 MM MISC As directed       . isosorbide mononitrate (IMDUR) 30 MG 24 hr tablet Take 1 tablet (30 mg total) by mouth 2 (two) times daily.  60 tablet  6  . lisinopril (PRINIVIL,ZESTRIL) 20 MG tablet Take 20 mg by mouth daily.        . metoprolol (LOPRESSOR) 100 MG tablet Take 100 mg by mouth 2 (two) times daily.        . Multiple Vitamin (MULTIVITAMIN) tablet Take 1 tablet by mouth daily.        . nitroGLYCERIN (NITROSTAT) 0.4 MG SL  tablet Place 0.4 mg under the tongue every 5 (five) minutes as needed.        Marland Kitchen omeprazole (PRILOSEC) 20 MG capsule Take 20 mg by mouth daily.        . potassium chloride SA (K-DUR,KLOR-CON) 20 MEQ tablet Take 1 tablet (20 mEq total) by mouth 2 (two) times daily.  60 tablet  6  . pravastatin (PRAVACHOL) 40 MG tablet Take 40 mg by mouth daily.        . Tamsulosin HCl (FLOMAX) 0.4 MG CAPS Take 0.4 mg by mouth.        . traMADol (ULTRAM) 50 MG tablet Take 1/2-1 tablet by mouth three times a day as needed       . warfarin (COUMADIN) 5 MG tablet Take 5 mg by mouth daily.         Past Medical History  Diagnosis Date  . Allergy   . Anemia   . Diabetes mellitus   . GERD (gastroesophageal reflux disease)   .  Hypertension   . Osteoarthritis   . Hyperlipidemia   . BPH (benign prostatic hyperplasia)   . Diastolic CHF, acute   . S/P ablation of atrial fibrillation   . CAD (coronary artery disease)   . Anxiety   . 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 Angioplasty     multiple   . Coronary artery bypass graft 01/2000  . Circumcision 12/2005    Family History  Problem Relation Age of Onset  . Diabetes Mother   . Heart disease Father   . Heart disease Sister   . Hypertension Sister   . Kidney disease Mother     ESRD  . Stroke Father 98    deceased from same    History   Social History  . Marital Status: Married    Spouse Name: N/A    Number of Children: 4  . Years of Education: N/A   Occupational History  . retired     disabled due to CAD   Social History Main Topics  . Smoking status: Never Smoker   . Smokeless tobacco: Former Neurosurgeon    Types: Chew    Quit date: 01/30/1985  . Alcohol Use: No  . Drug Use: No  . Sexually Active: Not on file   Other Topics Concern  . Not on file   Social History Narrative  . No narrative on file   Review of Systems Pain in both ears--thinks there may be wax in there Pain goes down side of  neck and towards throat    Objective:   Physical Exam  Constitutional: He appears well-developed and well-nourished. No distress.  Neck: No thyromegaly present.       Mild decrease in active flexion and extension Pain with rotation Traps are tight as is spine strap muscles 1 non tender posterior cervical node on the right ~1.5cm  Cardiovascular: Normal rate, regular rhythm and normal heart sounds.  Exam reveals no gallop.   No murmur heard. Pulmonary/Chest: Effort normal and breath sounds normal. No respiratory distress. He has no wheezes. He has no rales.  Psychiatric: His behavior is normal. Judgment and thought content normal.       Mildly anxious as usual Not depressed          Assessment & Plan:

## 2010-07-06 NOTE — Assessment & Plan Note (Signed)
New problem Seems muscular Don't think the single node is related Will try heat and tramadol

## 2010-07-06 NOTE — Assessment & Plan Note (Signed)
Seems to be much better on lantus Will check labs

## 2010-07-08 ENCOUNTER — Encounter: Payer: Self-pay | Admitting: Cardiovascular Disease

## 2010-07-08 ENCOUNTER — Encounter: Payer: Self-pay | Admitting: *Deleted

## 2010-07-08 LAB — HM DIABETES EYE EXAM

## 2010-07-14 ENCOUNTER — Ambulatory Visit: Payer: Self-pay | Admitting: Cardiovascular Disease

## 2010-07-18 ENCOUNTER — Other Ambulatory Visit: Payer: Self-pay | Admitting: Internal Medicine

## 2010-07-19 ENCOUNTER — Ambulatory Visit: Payer: No Typology Code available for payment source

## 2010-07-21 ENCOUNTER — Ambulatory Visit (INDEPENDENT_AMBULATORY_CARE_PROVIDER_SITE_OTHER): Payer: No Typology Code available for payment source | Admitting: Internal Medicine

## 2010-07-21 DIAGNOSIS — I82409 Acute embolism and thrombosis of unspecified deep veins of unspecified lower extremity: Secondary | ICD-10-CM

## 2010-07-21 DIAGNOSIS — Z5181 Encounter for therapeutic drug level monitoring: Secondary | ICD-10-CM

## 2010-07-21 DIAGNOSIS — Z7901 Long term (current) use of anticoagulants: Secondary | ICD-10-CM

## 2010-07-21 LAB — POCT INR: INR: 1.5

## 2010-07-21 NOTE — Patient Instructions (Signed)
Increased dose by 2.5 mg, recheck in 2 weeks

## 2010-07-27 ENCOUNTER — Ambulatory Visit: Payer: Self-pay | Admitting: Internal Medicine

## 2010-08-04 ENCOUNTER — Ambulatory Visit (INDEPENDENT_AMBULATORY_CARE_PROVIDER_SITE_OTHER): Payer: No Typology Code available for payment source | Admitting: Internal Medicine

## 2010-08-04 DIAGNOSIS — I82409 Acute embolism and thrombosis of unspecified deep veins of unspecified lower extremity: Secondary | ICD-10-CM

## 2010-08-04 DIAGNOSIS — Z7901 Long term (current) use of anticoagulants: Secondary | ICD-10-CM

## 2010-08-04 DIAGNOSIS — Z5181 Encounter for therapeutic drug level monitoring: Secondary | ICD-10-CM

## 2010-08-04 LAB — POCT INR: INR: 2.3

## 2010-08-04 NOTE — Patient Instructions (Signed)
Continue  5mg  daily, 7.5 mg tues, thurs, recheck 4 weeks

## 2010-08-23 ENCOUNTER — Other Ambulatory Visit: Payer: Self-pay | Admitting: Cardiovascular Disease

## 2010-09-01 ENCOUNTER — Ambulatory Visit: Payer: No Typology Code available for payment source

## 2010-09-05 ENCOUNTER — Ambulatory Visit (INDEPENDENT_AMBULATORY_CARE_PROVIDER_SITE_OTHER): Payer: No Typology Code available for payment source | Admitting: Internal Medicine

## 2010-09-05 DIAGNOSIS — Z5181 Encounter for therapeutic drug level monitoring: Secondary | ICD-10-CM

## 2010-09-05 DIAGNOSIS — I82409 Acute embolism and thrombosis of unspecified deep veins of unspecified lower extremity: Secondary | ICD-10-CM

## 2010-09-05 DIAGNOSIS — Z7901 Long term (current) use of anticoagulants: Secondary | ICD-10-CM

## 2010-09-05 NOTE — Patient Instructions (Signed)
Continue current dose, check in 4 weeks  

## 2010-10-06 ENCOUNTER — Encounter: Payer: Self-pay | Admitting: Internal Medicine

## 2010-10-06 ENCOUNTER — Ambulatory Visit (INDEPENDENT_AMBULATORY_CARE_PROVIDER_SITE_OTHER): Payer: No Typology Code available for payment source | Admitting: Internal Medicine

## 2010-10-06 VITALS — BP 146/60 | HR 54 | Temp 98.7°F | Wt 232.5 lb

## 2010-10-06 DIAGNOSIS — E119 Type 2 diabetes mellitus without complications: Secondary | ICD-10-CM

## 2010-10-06 DIAGNOSIS — I82409 Acute embolism and thrombosis of unspecified deep veins of unspecified lower extremity: Secondary | ICD-10-CM

## 2010-10-06 DIAGNOSIS — Z7901 Long term (current) use of anticoagulants: Secondary | ICD-10-CM

## 2010-10-06 DIAGNOSIS — Z5181 Encounter for therapeutic drug level monitoring: Secondary | ICD-10-CM

## 2010-10-06 DIAGNOSIS — S161XXA Strain of muscle, fascia and tendon at neck level, initial encounter: Secondary | ICD-10-CM

## 2010-10-06 DIAGNOSIS — M1990 Unspecified osteoarthritis, unspecified site: Secondary | ICD-10-CM

## 2010-10-06 DIAGNOSIS — S139XXA Sprain of joints and ligaments of unspecified parts of neck, initial encounter: Secondary | ICD-10-CM

## 2010-10-06 NOTE — Progress Notes (Signed)
Subjective:    Patient ID: [name], male    DOB: [birth year], 77 y.o.   MRN: [id number]  HPI Having headaches  Awoke with severe headache 9/4 in AM Pain from forehead around left side of face to neck Gets worse when he turns to the left  Tried ibuprofen yesterday---eased it some Hasn't tried topical rx or his tramadol  Feels his vision is blurry---at first in right, but now in both No syncope but has been dizzy  Does have occ "bothering" on left chest Has congestion in chest and head No fever Ongoing vague SOB  Ongoing pain in hands ?arthritis  Wife brings in his sugar record Sugars still often over 200 lantus is not affordable---running out of the samples we gave  Current Outpatient Prescriptions on File Prior to Visit  Medication Sig Dispense Refill  . acetaminophen (TYLENOL) 500 MG tablet Take 500 mg by mouth every 6 (six) hours as needed.        Marland Kitchen amiodarone (PACERONE) 200 MG tablet Take 200 mg by mouth daily.        Marland Kitchen amLODipine (NORVASC) 10 MG tablet TAKE 1 TABLET BY MOUTH EVERY DAY  30 tablet  10  . aspirin 81 MG tablet Take 81 mg by mouth daily.        Marland Kitchen azithromycin (ZITHROMAX) 250 MG tablet Take 2 tablets by mouth on day 1, followed by 1 tablet by mouth daily for 4 days.        . clopidogrel (PLAVIX) 75 MG tablet Take 75 mg by mouth daily.        . fluticasone (FLONASE) 50 MCG/ACT nasal spray 2 sprays by Nasal route as needed.        . furosemide (LASIX) 80 MG tablet Take 80 mg by mouth 2 (two) times daily.        Marland Kitchen glipiZIDE (GLUCOTROL) 10 MG tablet Take 10 mg by mouth daily.        Marland Kitchen glucose blood (BAYER CONTOUR TEST) test strip Use as instructed, check blood sugar three times daily       . insulin glargine (LANTUS) 100 UNIT/ML injection Inject 10 Units into the skin at bedtime.        . Insulin Pen Needle (PEN NEEDLES 5/16") 30G X 8 MM MISC As directed       . isosorbide mononitrate (IMDUR) 30 MG 24 hr tablet Take 1 tablet (30 mg total) by mouth 2 (two)  times daily.  60 tablet  6  . lisinopril (PRINIVIL,ZESTRIL) 20 MG tablet Take 20 mg by mouth daily.        . metoprolol (LOPRESSOR) 100 MG tablet Take 100 mg by mouth 2 (two) times daily.        . Multiple Vitamin (MULTIVITAMIN) tablet Take 1 tablet by mouth daily.        . nitroGLYCERIN (NITROSTAT) 0.4 MG SL tablet Place 0.4 mg under the tongue every 5 (five) minutes as needed.        Marland Kitchen omeprazole (PRILOSEC) 20 MG capsule Take 20 mg by mouth daily.        . potassium chloride SA (K-DUR,KLOR-CON) 20 MEQ tablet Take 1 tablet (20 mEq total) by mouth 2 (two) times daily.  60 tablet  6  . pravastatin (PRAVACHOL) 40 MG tablet Take 40 mg by mouth daily.        . ranitidine (ZANTAC) 150 MG tablet Take 150 mg by mouth at bedtime.        . Tamsulosin  HCl (FLOMAX) 0.4 MG CAPS Take 0.4 mg by mouth.        . traMADol (ULTRAM) 50 MG tablet Take 1/2-1 tablet by mouth three times a day as needed  60 tablet  0  . warfarin (COUMADIN) 5 MG tablet TAKE 1+1/2 TABLETS DAILY OR AS DIRECTED  60 tablet  7    Allergies  Allergen Reactions  . Buspirone Hcl     REACTION: unknown  . ZOX:[id number] Acid+Aspartame     REACTION: unknown  . Sulfonamide Derivatives     REACTION: unspecified    Past Medical History  Diagnosis Date  . Allergy   . Anemia   . GERD (gastroesophageal reflux disease)   . Hypertension   . Osteoarthritis   . Hyperlipidemia   . BPH (benign prostatic hyperplasia)     Dr. Achilles Dunk  . Diastolic CHF, acute   . S/P ablation of atrial fibrillation   . Anxiety   . 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 Diamond Nickel  . Allergic rhinitis   . CAD (coronary artery disease)     Dr. Diamond Nickel  . Diabetes mellitus type II   . Atrial flutter 07/1999  . Atrial tachycardia 03/2000  . Chest pain 03/2000    Past Surgical History  Procedure Date  . Angioplasty     multiple, and stents  . Coronary artery bypass graft 01/2000  . Circumcision 12/2005  . Coronary stent placement  07/2003    RCA  . Circumcision 12/2005    Family History  Problem Relation Age of Onset  . Diabetes Mother   . Kidney disease Mother     ESRD  . Heart disease Father     CVA  . Stroke Father 36    deceased from same  . Hypertension Sister   . Coronary artery disease Sister     History   Social History  . Marital Status: Married    Spouse Name: N/A    Number of Children: 4  . Years of Education: N/A   Occupational History  . retired     disabled due to CAD   Social History Main Topics  . Smoking status: Never Smoker   . Smokeless tobacco: Former Neurosurgeon    Types: Chew    Quit date: 01/30/1985  . Alcohol Use: No     Quit 30 years ago  . Drug Use: No  . Sexually Active: Not on file   Other Topics Concern  . Not on file   Social History Narrative   Married with 4 children   Review of Systems Some nausea after eating    Objective:   Physical Exam  Constitutional: He appears well-developed and well-nourished.       Usual self---always appears out of sorts No distress  HENT:  Head: Normocephalic and atraumatic.  Right Ear: External ear normal.  Left Ear: External ear normal.  Mouth/Throat: Oropharynx is clear and moist. No oropharyngeal exudate.  Eyes: Conjunctivae and EOM are normal. Pupils are equal, round, and reactive to light.  Neck:       Neck is stiff Tenderness along left posterior sternocleidomastoid muscle Mild forehead tenderness Pain with tilt and rotation to left  Cardiovascular: Normal rate, regular rhythm and normal heart sounds.  Exam reveals no gallop.   No murmur heard. Pulmonary/Chest: Effort normal. No respiratory distress. He has no wheezes. He has no rales.  Musculoskeletal: He exhibits no edema.  Neurological:       Gait normal No  focal weakness  Psychiatric: His behavior is normal. Thought content normal.          Assessment & Plan:

## 2010-10-06 NOTE — Patient Instructions (Addendum)
Continue 5mg  daily, 7.5 mg tues, thurs, recheck  4 weels

## 2010-10-06 NOTE — Assessment & Plan Note (Signed)
Lab Results  Component Value Date   HGBA1C 10.6* 07/06/2010   Having trouble keeping up with insulin due to cost Will try to get more samples Recheck with next appt

## 2010-10-06 NOTE — Assessment & Plan Note (Signed)
Mostly in hands but has widespread stiffness and some pain (like back) Tylenol and tramadol are his safest options

## 2010-10-06 NOTE — Patient Instructions (Signed)
Please use heat on your neck. Try acetaminophen 500-650mg  up to four times daily. If this isn't enough for your neck or hands, try the tramadol

## 2010-10-06 NOTE — Assessment & Plan Note (Signed)
Really seems to have muscular neck problems radiating to head This is causing the headache Discussed using the tramadol Use heat on the area as well

## 2010-10-07 ENCOUNTER — Ambulatory Visit: Payer: No Typology Code available for payment source

## 2010-10-07 ENCOUNTER — Ambulatory Visit: Payer: No Typology Code available for payment source | Admitting: Internal Medicine

## 2010-10-28 LAB — DIFFERENTIAL
Basophils Relative: 0
Eosinophils Absolute: 0.1
Monocytes Absolute: 0.6
Monocytes Relative: 11

## 2010-10-28 LAB — CBC
HCT: 36.2 — ABNORMAL LOW
HCT: 37.9 — ABNORMAL LOW
HCT: 39.8
Hemoglobin: 11.7 — ABNORMAL LOW
Hemoglobin: 12 — ABNORMAL LOW
Hemoglobin: 12 — ABNORMAL LOW
Hemoglobin: 12.4 — ABNORMAL LOW
Hemoglobin: 12.6 — ABNORMAL LOW
Hemoglobin: 13.3
MCHC: 33.1
MCHC: 33.1
MCHC: 33.4
MCHC: 34.1
MCV: 87.7
MCV: 88.6
MCV: 89.4
Platelets: 140 — ABNORMAL LOW
RBC: 3.97 — ABNORMAL LOW
RBC: 4.05 — ABNORMAL LOW
RBC: 4.24
RBC: 4.49
RDW: 14.7
RDW: 14.8
RDW: 14.9
RDW: 15.2
WBC: 5.7

## 2010-10-28 LAB — COMPREHENSIVE METABOLIC PANEL
ALT: 61 — ABNORMAL HIGH
Albumin: 3.5
Alkaline Phosphatase: 86
Glucose, Bld: 71
Potassium: 4.4
Sodium: 140
Total Protein: 6.9

## 2010-10-28 LAB — BASIC METABOLIC PANEL
CO2: 24
CO2: 25
CO2: 27
CO2: 29
Calcium: 8.5
Chloride: 105
Chloride: 106
Chloride: 109
Creatinine, Ser: 1.31
Creatinine, Ser: 1.43
GFR calc Af Amer: 46 — ABNORMAL LOW
GFR calc Af Amer: >60
Glucose, Bld: 146 — ABNORMAL HIGH
Glucose, Bld: 149 — ABNORMAL HIGH
Glucose, Bld: 173 — ABNORMAL HIGH
Potassium: 4
Potassium: 4.1
Potassium: 4.5
Sodium: 139
Sodium: 139
Sodium: 141

## 2010-10-28 LAB — T4, FREE: Free T4: 1.07

## 2010-10-28 LAB — PROTIME-INR
INR: 2.1 — ABNORMAL HIGH
Prothrombin Time: 25.7 — ABNORMAL HIGH

## 2010-10-28 LAB — HEPARIN LEVEL (UNFRACTIONATED): Heparin Unfractionated: 1.1 — ABNORMAL HIGH

## 2010-10-28 LAB — CARDIAC PANEL(CRET KIN+CKTOT+MB+TROPI)
CK, MB: 2.9
CK, MB: 3.3
Relative Index: 1.5
Total CK: 212

## 2010-10-28 LAB — TROPONIN I: Troponin I: 0.02

## 2010-10-28 LAB — CK TOTAL AND CKMB (NOT AT ARMC): Relative Index: 1.4

## 2010-11-03 ENCOUNTER — Ambulatory Visit: Payer: No Typology Code available for payment source

## 2010-11-04 ENCOUNTER — Ambulatory Visit (INDEPENDENT_AMBULATORY_CARE_PROVIDER_SITE_OTHER): Payer: No Typology Code available for payment source

## 2010-11-04 ENCOUNTER — Ambulatory Visit (INDEPENDENT_AMBULATORY_CARE_PROVIDER_SITE_OTHER): Payer: No Typology Code available for payment source | Admitting: Family Medicine

## 2010-11-04 DIAGNOSIS — Z5181 Encounter for therapeutic drug level monitoring: Secondary | ICD-10-CM

## 2010-11-04 DIAGNOSIS — Z7901 Long term (current) use of anticoagulants: Secondary | ICD-10-CM

## 2010-11-04 DIAGNOSIS — I82409 Acute embolism and thrombosis of unspecified deep veins of unspecified lower extremity: Secondary | ICD-10-CM

## 2010-11-04 DIAGNOSIS — Z23 Encounter for immunization: Secondary | ICD-10-CM

## 2010-11-04 LAB — POCT INR: INR: 3.3

## 2010-11-04 LAB — GLUCOSE, CAPILLARY: Glucose-Capillary: 145 mg/dL — ABNORMAL HIGH (ref 70–99)

## 2010-11-04 NOTE — Patient Instructions (Addendum)
Continue 5mg  daily, 7.5 mg tues, thurs, recheck  4weeks

## 2010-12-02 ENCOUNTER — Ambulatory Visit (INDEPENDENT_AMBULATORY_CARE_PROVIDER_SITE_OTHER): Payer: No Typology Code available for payment source | Admitting: Internal Medicine

## 2010-12-02 ENCOUNTER — Other Ambulatory Visit: Payer: Self-pay | Admitting: *Deleted

## 2010-12-02 DIAGNOSIS — I4891 Unspecified atrial fibrillation: Secondary | ICD-10-CM

## 2010-12-02 DIAGNOSIS — I82409 Acute embolism and thrombosis of unspecified deep veins of unspecified lower extremity: Secondary | ICD-10-CM

## 2010-12-02 DIAGNOSIS — Z7901 Long term (current) use of anticoagulants: Secondary | ICD-10-CM

## 2010-12-02 DIAGNOSIS — Z5181 Encounter for therapeutic drug level monitoring: Secondary | ICD-10-CM

## 2010-12-02 LAB — POCT INR: INR: 3.3

## 2010-12-02 MED ORDER — FUROSEMIDE 80 MG PO TABS: 80.0000 mg | ORAL_TABLET | Freq: Two times a day (BID) | ORAL | Status: DC

## 2010-12-02 NOTE — Patient Instructions (Signed)
Continue current dose, check in 4 weeks  

## 2010-12-05 ENCOUNTER — Ambulatory Visit: Payer: No Typology Code available for payment source | Admitting: Internal Medicine

## 2010-12-28 ENCOUNTER — Other Ambulatory Visit: Payer: Self-pay | Admitting: Internal Medicine

## 2010-12-29 ENCOUNTER — Telehealth: Payer: Self-pay

## 2010-12-29 ENCOUNTER — Encounter: Payer: Self-pay | Admitting: Family Medicine

## 2010-12-29 ENCOUNTER — Ambulatory Visit (INDEPENDENT_AMBULATORY_CARE_PROVIDER_SITE_OTHER): Payer: No Typology Code available for payment source | Admitting: Family Medicine

## 2010-12-29 VITALS — BP 136/66 | HR 62 | Temp 100.3°F | Wt 228.2 lb

## 2010-12-29 DIAGNOSIS — I4891 Unspecified atrial fibrillation: Secondary | ICD-10-CM

## 2010-12-29 DIAGNOSIS — Z7901 Long term (current) use of anticoagulants: Secondary | ICD-10-CM

## 2010-12-29 DIAGNOSIS — R05 Cough: Secondary | ICD-10-CM

## 2010-12-29 DIAGNOSIS — R059 Cough, unspecified: Secondary | ICD-10-CM | POA: Insufficient documentation

## 2010-12-29 DIAGNOSIS — Z5181 Encounter for therapeutic drug level monitoring: Secondary | ICD-10-CM

## 2010-12-29 DIAGNOSIS — I82409 Acute embolism and thrombosis of unspecified deep veins of unspecified lower extremity: Secondary | ICD-10-CM

## 2010-12-29 MED ORDER — DOXYCYCLINE HYCLATE 100 MG PO CAPS: 100.0000 mg | ORAL_CAPSULE | Freq: Two times a day (BID) | ORAL | Status: AC

## 2010-12-29 MED ORDER — NITROGLYCERIN 0.4 MG SL SUBL: 0.4000 mg | SUBLINGUAL_TABLET | SUBLINGUAL | Status: AC | PRN

## 2010-12-29 MED ORDER — GUAIFENESIN-CODEINE 100-10 MG/5ML PO SYRP: 5.0000 mL | ORAL_SOLUTION | Freq: Every evening | ORAL | Status: AC | PRN

## 2010-12-29 MED ORDER — INSULIN GLARGINE 100 UNIT/ML ~~LOC~~ SOLN: 10.0000 [IU] | Freq: Every day | SUBCUTANEOUS | Status: DC

## 2010-12-29 MED ORDER — GUAIFENESIN-CODEINE 100-10 MG/5ML PO SYRP: 180.0000 mL | ORAL_SOLUTION | Freq: Every evening | ORAL | Status: DC | PRN

## 2010-12-29 NOTE — Assessment & Plan Note (Addendum)
Anticipate developing bronchitis. Lungs clear today, good O2 sat. Given comorbidities, cover with antibiotic.   cheratussin for cough at night. augmentin and sulfa allergy, avoid zpack 2/2 CV hx.  Check INR today - depending on level, may hold 1 dose or cut in 1/2.  Will recheck next week.

## 2010-12-29 NOTE — Patient Instructions (Signed)
Take 2.5 mg for the next two (2) days, then 5 mg daily, will recheck at you next appt with the Dr in 1 week (started antibiotics today)

## 2010-12-29 NOTE — Telephone Encounter (Signed)
Called and clarified

## 2010-12-29 NOTE — Progress Notes (Signed)
  Subjective:    Patient ID: [name], male    DOB: 1931-09-24, 77 y.o.   MRN: [id number]  HPI CC: cough  Complicated pt of Dr. Karle Starch new to me with h/o T2DM, HTN, HLD, CAD s/p MI and CABG, h/o afib, PVD, CRI presents with 3d h/o SOB, cough productive of brown/yellow mucous.  Seems to be getting worse.  Chest pain with cough.  Also feeling dizzy from cough.  Feeling head and chest congestion.  + HA and ear pain occasionally.  Appetite down.  Has tried tussin sugar free for ocugh, not helping.  No fevers/chills, nausea/vomiting, rashes, new myalgia/arthralgia, tooth pain.  No sick contacts at home.  No smokers at home.  No asthma/copd.  Would like refill of nitro.  Lab Results  Component Value Date   CREATININE 2.4* 07/06/2010   Review of Systems Per HPI    Objective:   Physical Exam  Nursing note and vitals reviewed. Constitutional: He appears well-developed and well-nourished. No distress.       Evidently congested, coughing fits  HENT:  Head: Normocephalic and atraumatic.  Right Ear: Hearing, tympanic membrane, external ear and ear canal normal.  Left Ear: Hearing, tympanic membrane, external ear and ear canal normal.  Nose: Nose normal. No mucosal edema or rhinorrhea. Right sinus exhibits no maxillary sinus tenderness and no frontal sinus tenderness. Left sinus exhibits no maxillary sinus tenderness and no frontal sinus tenderness.  Mouth/Throat: Uvula is midline, oropharynx is clear and moist and mucous membranes are normal. No oropharyngeal exudate, posterior oropharyngeal edema, posterior oropharyngeal erythema or tonsillar abscesses.  Eyes: Conjunctivae and EOM are normal. Pupils are equal, round, and reactive to light. No scleral icterus.  Neck: Normal range of motion. Neck supple. No thyromegaly present.  Cardiovascular: Normal rate, regular rhythm, normal heart sounds and intact distal pulses.   No murmur heard. Pulmonary/Chest: Effort normal and breath sounds  normal. No respiratory distress. He has no wheezes. He has no rales.  Musculoskeletal: He exhibits edema (trace pedal edema).  Lymphadenopathy:    He has no cervical adenopathy.  Skin: Skin is warm and dry. No rash noted.       Assessment & Plan:

## 2010-12-29 NOTE — Patient Instructions (Signed)
Coumadin check today. Sounds like bronchitis, may be developing sinusitis. Treat with cheratussin at night for cough and doxycycline antibiotice twice daily for 10 days. Come back next week for follow up wit hDr. Alphonsus Sias, sooner if worsening.

## 2010-12-29 NOTE — Telephone Encounter (Signed)
[facility] received rx for robitussin [facility] but wanted to verify instructions that were by mouth as needed for cough at bedtime.Please advise.

## 2010-12-30 ENCOUNTER — Ambulatory Visit: Payer: No Typology Code available for payment source | Admitting: Internal Medicine

## 2010-12-30 ENCOUNTER — Ambulatory Visit: Payer: No Typology Code available for payment source

## 2011-01-06 ENCOUNTER — Encounter: Payer: Self-pay | Admitting: Internal Medicine

## 2011-01-06 ENCOUNTER — Ambulatory Visit: Payer: No Typology Code available for payment source | Admitting: Internal Medicine

## 2011-01-06 ENCOUNTER — Ambulatory Visit (INDEPENDENT_AMBULATORY_CARE_PROVIDER_SITE_OTHER): Payer: No Typology Code available for payment source | Admitting: Internal Medicine

## 2011-01-06 VITALS — BP 131/54 | HR 55 | Temp 97.2°F | Ht 70.0 in | Wt 224.0 lb

## 2011-01-06 DIAGNOSIS — I4891 Unspecified atrial fibrillation: Secondary | ICD-10-CM

## 2011-01-06 DIAGNOSIS — I25810 Atherosclerosis of coronary artery bypass graft(s) without angina pectoris: Secondary | ICD-10-CM

## 2011-01-06 DIAGNOSIS — Z23 Encounter for immunization: Secondary | ICD-10-CM

## 2011-01-06 DIAGNOSIS — Z7901 Long term (current) use of anticoagulants: Secondary | ICD-10-CM

## 2011-01-06 DIAGNOSIS — E1165 Type 2 diabetes mellitus with hyperglycemia: Secondary | ICD-10-CM

## 2011-01-06 DIAGNOSIS — Z5181 Encounter for therapeutic drug level monitoring: Secondary | ICD-10-CM

## 2011-01-06 DIAGNOSIS — R413 Other amnesia: Secondary | ICD-10-CM

## 2011-01-06 DIAGNOSIS — I82409 Acute embolism and thrombosis of unspecified deep veins of unspecified lower extremity: Secondary | ICD-10-CM

## 2011-01-06 DIAGNOSIS — E1129 Type 2 diabetes mellitus with other diabetic kidney complication: Secondary | ICD-10-CM

## 2011-01-06 LAB — CBC WITH DIFFERENTIAL/PLATELET
Basophils Absolute: 0 10*3/uL (ref 0.0–0.1)
Eosinophils Absolute: 0.1 10*3/uL (ref 0.0–0.7)
HCT: 38.6 % — ABNORMAL LOW (ref 39.0–52.0)
Hemoglobin: 12.8 g/dL — ABNORMAL LOW (ref 13.0–17.0)
Lymphs Abs: 1.7 10*3/uL (ref 0.7–4.0)
MCHC: 33.3 g/dL (ref 30.0–36.0)
MCV: 87.8 fl (ref 78.0–100.0)
Monocytes Absolute: 0.6 10*3/uL (ref 0.1–1.0)
Monocytes Relative: 9.1 % (ref 3.0–12.0)
Neutro Abs: 3.8 10*3/uL (ref 1.4–7.7)
Platelets: 147 10*3/uL — ABNORMAL LOW (ref 150.0–400.0)
RDW: 14.6 % (ref 11.5–14.6)

## 2011-01-06 LAB — BASIC METABOLIC PANEL
BUN: 42 mg/dL — ABNORMAL HIGH (ref 6–23)
CO2: 24 mEq/L (ref 19–32)
GFR: 24.29 mL/min — ABNORMAL LOW (ref >60.00)
Glucose, Bld: 257 mg/dL — ABNORMAL HIGH (ref 70–99)
Potassium: 4.4 mEq/L (ref 3.5–5.1)

## 2011-01-06 LAB — HEPATIC FUNCTION PANEL
Albumin: 3.6 g/dL (ref 3.5–5.2)
Total Protein: 7.6 g/dL (ref 6.0–8.3)

## 2011-01-06 LAB — TSH: TSH: 3.44 u[IU]/mL (ref 0.35–5.50)

## 2011-01-06 NOTE — Assessment & Plan Note (Signed)
Still regular On coumadin 

## 2011-01-06 NOTE — Patient Instructions (Signed)
Please stop the pravastatin till next month's appointment Bring in the list of medications (insulin) that your insurance covers to the upcoming visit

## 2011-01-06 NOTE — Progress Notes (Signed)
Subjective:    Patient ID: [name], male    DOB: 27-Oct-1931, 77 y.o.   MRN: [id number]  HPI Has almost run out of lantus Sugars had gone up over the 200s so they increased to 20 units daily No serious hypoglycemic spells--some under 100 and he "just feel like I have no energy" Due for eye exam in February  No heart trouble occ fleeting substernal pain or itching Breathing off a little---with exertion. Harder to walk lately Stable edema Regular dizziness. No syncope Occ skip beats---usually with activity  Some anxiety of late Shaky in AM before he eats something Occ depressed feelings--only once in a while Wife notes some memory problems---repeats questions  Current Outpatient Prescriptions on File Prior to Visit  Medication Sig Dispense Refill  . acetaminophen (TYLENOL) 500 MG tablet Take 500 mg by mouth every 6 (six) hours as needed.        Marland Kitchen amiodarone (PACERONE) 200 MG tablet Take 200 mg by mouth daily.        Marland Kitchen amLODipine (NORVASC) 10 MG tablet TAKE 1 TABLET BY MOUTH EVERY DAY  30 tablet  10  . aspirin 81 MG tablet Take 81 mg by mouth daily.        . clopidogrel (PLAVIX) 75 MG tablet Take 75 mg by mouth daily.        Marland Kitchen doxycycline (VIBRAMYCIN) 100 MG capsule Take 1 capsule (100 mg total) by mouth 2 (two) times daily.  20 capsule  0  . fluticasone (FLONASE) 50 MCG/ACT nasal spray 2 sprays by Nasal route as needed.        . furosemide (LASIX) 80 MG tablet Take 1 tablet (80 mg total) by mouth 2 (two) times daily.  60 tablet  11  . glipiZIDE (GLUCOTROL XL) 10 MG 24 hr tablet TAKE 1 TABLET BY MOUTH EVERY DAY  30 tablet  10  . glucose blood (BAYER CONTOUR TEST) test strip Use as instructed, check blood sugar three times daily       . guaiFENesin-codeine (ROBITUSSIN AC) 100-10 MG/5ML syrup Take 5 mLs by mouth at bedtime as needed for cough.  180 mL  0  . insulin glargine (LANTUS OPTICLIK) 100 UNIT/ML injection Inject 10-20 Units into the skin at bedtime.  10 mL  11  . Insulin  Pen Needle (PEN NEEDLES 5/16") 30G X 8 MM MISC As directed       . isosorbide mononitrate (IMDUR) 30 MG 24 hr tablet Take 1 tablet (30 mg total) by mouth 2 (two) times daily.  60 tablet  6  . lisinopril (PRINIVIL,ZESTRIL) 20 MG tablet Take 20 mg by mouth daily.        . metoprolol (LOPRESSOR) 100 MG tablet Take 100 mg by mouth 2 (two) times daily.        . Multiple Vitamin (MULTIVITAMIN) tablet Take 1 tablet by mouth daily.        . nitroGLYCERIN (NITROSTAT) 0.4 MG SL tablet Place 1 tablet (0.4 mg total) under the tongue every 5 (five) minutes as needed.  30 tablet  3  . omeprazole (PRILOSEC) 20 MG capsule Take 20 mg by mouth daily.        . potassium chloride SA (K-DUR,KLOR-CON) 20 MEQ tablet Take 1 tablet (20 mEq total) by mouth 2 (two) times daily.  60 tablet  6  . pravastatin (PRAVACHOL) 40 MG tablet Take 40 mg by mouth daily.        . Tamsulosin HCl (FLOMAX) 0.4 MG CAPS Take 0.4  mg by mouth.        . traMADol (ULTRAM) 50 MG tablet Take 1/2-1 tablet by mouth three times a day as needed  60 tablet  0  . warfarin (COUMADIN) 5 MG tablet TAKE 1+1/2 TABLETS DAILY OR AS DIRECTED  60 tablet  7    Allergies  Allergen Reactions  . Buspirone Hcl     REACTION: unknown  . ZOX:[id number]+XBJYNWGNF+AOZHYQMVHQ Acid+Aspartame     REACTION: unknown  . Sulfonamide Derivatives     REACTION: unspecified    Past Medical History  Diagnosis Date  . Allergy   . Anemia   . GERD (gastroesophageal reflux disease)   . Hypertension   . Osteoarthritis   . Hyperlipidemia   . BPH (benign prostatic hyperplasia)     Dr. Achilles Dunk  . Diastolic CHF, acute   . S/P ablation of atrial fibrillation   . Anxiety   . 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 Diamond Nickel  . Allergic rhinitis   . CAD (coronary artery disease)     Dr. Diamond Nickel  . Diabetes mellitus type II   . Atrial flutter 07/1999  . Atrial tachycardia 03/2000  . Chest pain 03/2000    Past Surgical History  Procedure Date  . Angioplasty      multiple, and stents  . Coronary artery bypass graft 01/2000  . Circumcision 12/2005  . Coronary stent placement 07/2003    RCA  . Circumcision 12/2005    Family History  Problem Relation Age of Onset  . Diabetes Mother   . Kidney disease Mother     ESRD  . Heart disease Father     CVA  . Stroke Father 34    deceased from same  . Hypertension Sister   . Coronary artery disease Sister     History   Social History  . Marital Status: Married    Spouse Name: N/A    Number of Children: 4  . Years of Education: N/A   Occupational History  . retired     disabled due to CAD   Social History Main Topics  . Smoking status: Never Smoker   . Smokeless tobacco: Former Neurosurgeon    Types: Chew    Quit date: 01/30/1985  . Alcohol Use: No     Quit 30 years ago  . Drug Use: No  . Sexually Active: Not on file   Other Topics Concern  . Not on file   Social History Narrative   Married with 4 children   Review of Systems Sleeps variably--snores heavy. Sleeps in day a lot. No regular apnea periods Appetite is okay    Objective:   Physical Exam  Constitutional: He appears well-developed and well-nourished. No distress.  Neck: Normal range of motion. Neck supple. No thyromegaly present.  Cardiovascular: Normal rate, regular rhythm, normal heart sounds and intact distal pulses.  Exam reveals no gallop.   No murmur heard. Pulmonary/Chest: Effort normal and breath sounds normal. No respiratory distress. He has no wheezes. He has no rales.  Musculoskeletal:       No pitting edema  Lymphadenopathy:    He has no cervical adenopathy.  Neurological: He is alert.       Friday, November 2012 Doctor's office, [place], one of the [place]" [phone]-.... Unable to spell world backwards Recall  1/3  Skin:       Mycotic toenails Dry scaly feet without open lesions  Psychiatric: He has a normal mood and affect.  His behavior is normal.          Assessment &  Plan:

## 2011-01-06 NOTE — Assessment & Plan Note (Signed)
Poor control We need to find an insulin his insurance covers More lantus samples given for now Will probably have to increase lantus quite a bit---will call with recommendations after labs  Lab Results  Component Value Date   HGBA1C 10.6* 07/06/2010

## 2011-01-06 NOTE — Assessment & Plan Note (Signed)
Seems to be quiet No changes 

## 2011-01-06 NOTE — Patient Instructions (Signed)
2.5, 2.5,5.0 while on abx, once finished resume previous dose of 5 mg daily, 7.5 mg Tues, Thur. Recheck 2 weeks

## 2011-01-06 NOTE — Assessment & Plan Note (Signed)
Noticeable to wife for about 2 months May have mild cognitive impairment Will stop the statin Check labs including vitamin B12 and recheck 1 month

## 2011-01-07 ENCOUNTER — Other Ambulatory Visit: Payer: Self-pay | Admitting: Cardiovascular Disease

## 2011-01-09 ENCOUNTER — Telehealth: Payer: Self-pay

## 2011-01-09 MED ORDER — POTASSIUM CHLORIDE CRYS ER 20 MEQ PO TBCR: 20.0000 meq | EXTENDED_RELEASE_TABLET | Freq: Two times a day (BID) | ORAL | Status: DC

## 2011-01-09 NOTE — Telephone Encounter (Signed)
Refill sent for potassium chloride 20 meq one tablet twice a day.

## 2011-01-13 ENCOUNTER — Telehealth: Payer: Self-pay | Admitting: Internal Medicine

## 2011-01-13 NOTE — Telephone Encounter (Signed)
Patient is requesting a Rx for Diabetic shoes.  Please advise.

## 2011-01-16 NOTE — Telephone Encounter (Signed)
Spoke with patient's wife and advised results rx mailed to home address

## 2011-01-16 NOTE — Telephone Encounter (Signed)
Rx written.

## 2011-01-20 ENCOUNTER — Ambulatory Visit: Payer: No Typology Code available for payment source

## 2011-01-27 ENCOUNTER — Encounter: Payer: Self-pay | Admitting: Internal Medicine

## 2011-01-27 ENCOUNTER — Ambulatory Visit (INDEPENDENT_AMBULATORY_CARE_PROVIDER_SITE_OTHER): Payer: No Typology Code available for payment source | Admitting: Internal Medicine

## 2011-01-27 VITALS — BP 140/70 | HR 76 | Temp 97.3°F | Ht 70.0 in | Wt 225.0 lb

## 2011-01-27 DIAGNOSIS — E1129 Type 2 diabetes mellitus with other diabetic kidney complication: Secondary | ICD-10-CM

## 2011-01-27 DIAGNOSIS — N259 Disorder resulting from impaired renal tubular function, unspecified: Secondary | ICD-10-CM

## 2011-01-27 DIAGNOSIS — E1165 Type 2 diabetes mellitus with hyperglycemia: Secondary | ICD-10-CM

## 2011-01-27 DIAGNOSIS — Z5181 Encounter for therapeutic drug level monitoring: Secondary | ICD-10-CM

## 2011-01-27 DIAGNOSIS — R748 Abnormal levels of other serum enzymes: Secondary | ICD-10-CM

## 2011-01-27 DIAGNOSIS — Z7901 Long term (current) use of anticoagulants: Secondary | ICD-10-CM

## 2011-01-27 DIAGNOSIS — I4891 Unspecified atrial fibrillation: Secondary | ICD-10-CM

## 2011-01-27 DIAGNOSIS — I82409 Acute embolism and thrombosis of unspecified deep veins of unspecified lower extremity: Secondary | ICD-10-CM

## 2011-01-27 LAB — POCT INR: INR: 3.3

## 2011-01-27 MED ORDER — INSULIN DETEMIR 100 UNIT/ML ~~LOC~~ SOLN: 20.0000 [IU] | Freq: Every day | SUBCUTANEOUS | Status: DC

## 2011-01-27 NOTE — Assessment & Plan Note (Signed)
Lab Results  Component Value Date   CREATININE 3.2* 01/06/2011   Has gone up Will change lasix to prn Hold the lisinopril for now Needs to get back in with Dr Cherylann Ratel

## 2011-01-27 NOTE — Patient Instructions (Signed)
Please finish the lantus and change to levemir--20 units daily Please stop lisinopril and pravastatin Only take the furosemide once a day---and only if you leg swelling is much worse. Take 1 potassium tab daily on days you take the furosemide. Please call and get an appointment as soon as possible with Dr Cherylann Ratel

## 2011-01-27 NOTE — Assessment & Plan Note (Signed)
Lab Results  Component Value Date   HGBA1C 12.8* 01/06/2011   Much worse This may be due to not being able to afford the lantus levemir is covered---will send new Rx

## 2011-01-27 NOTE — Progress Notes (Signed)
Subjective:    Patient ID: [name], male    DOB: [birth year], 77 y.o.   MRN: [id number]  HPI Here with wife  Diabetes control really worsened Has been testing daily  87-222 No clinical hypoglycemia  Has noted some headache Energy levels are down Some pain around chest wall on left---not angina  Went for eye exam last week Cataract found and is going for cataract removal on Jan 15th  No fever Ongoing cough Awakens at night with SOB Mild edema in right leg Takes furosemide daily  Current Outpatient Prescriptions on File Prior to Visit  Medication Sig Dispense Refill  . acetaminophen (TYLENOL) 500 MG tablet Take 500 mg by mouth every 6 (six) hours as needed.        Marland Kitchen amiodarone (PACERONE) 200 MG tablet Take 200 mg by mouth daily.        Marland Kitchen amLODipine (NORVASC) 10 MG tablet TAKE 1 TABLET BY MOUTH EVERY DAY  30 tablet  10  . aspirin 81 MG tablet Take 81 mg by mouth daily.        . clopidogrel (PLAVIX) 75 MG tablet Take 75 mg by mouth daily.        . fluticasone (FLONASE) 50 MCG/ACT nasal spray 2 sprays by Nasal route as needed.        . furosemide (LASIX) 80 MG tablet Take 1 tablet (80 mg total) by mouth 2 (two) times daily.  60 tablet  11  . glipiZIDE (GLUCOTROL XL) 10 MG 24 hr tablet TAKE 1 TABLET BY MOUTH EVERY DAY  30 tablet  10  . glucose blood (BAYER CONTOUR TEST) test strip Use as instructed, check blood sugar three times daily       . insulin glargine (LANTUS OPTICLIK) 100 UNIT/ML injection Inject 10-20 Units into the skin at bedtime.  10 mL  11  . Insulin Pen Needle (PEN NEEDLES 5/16") 30G X 8 MM MISC As directed       . isosorbide mononitrate (IMDUR) 30 MG 24 hr tablet Take 1 tablet (30 mg total) by mouth 2 (two) times daily.  60 tablet  6  . lisinopril (PRINIVIL,ZESTRIL) 20 MG tablet Take 20 mg by mouth daily.        . metoprolol (LOPRESSOR) 100 MG tablet Take 100 mg by mouth 2 (two) times daily.        . Multiple Vitamin (MULTIVITAMIN) tablet Take 1 tablet by  mouth daily.        . nitroGLYCERIN (NITROSTAT) 0.4 MG SL tablet Place 1 tablet (0.4 mg total) under the tongue every 5 (five) minutes as needed.  30 tablet  3  . omeprazole (PRILOSEC) 20 MG capsule Take 20 mg by mouth daily.        . potassium chloride SA (K-DUR,KLOR-CON) 20 MEQ tablet Take 1 tablet (20 mEq total) by mouth 2 (two) times daily.  60 tablet  6  . pravastatin (PRAVACHOL) 40 MG tablet Take 40 mg by mouth daily.        . Tamsulosin HCl (FLOMAX) 0.4 MG CAPS Take 0.4 mg by mouth.        . traMADol (ULTRAM) 50 MG tablet Take 1/2-1 tablet by mouth three times a day as needed  60 tablet  0  . warfarin (COUMADIN) 5 MG tablet TAKE 1+1/2 TABLETS DAILY OR AS DIRECTED  60 tablet  7    Allergies  Allergen Reactions  . Buspirone Hcl     REACTION: unknown  . WUJ:WJXBJYNWGNF+AOZHYQMVH+QIONGEXBMW Acid+Aspartame  REACTION: unknown  . Sulfonamide Derivatives     REACTION: unspecified    Past Medical History  Diagnosis Date  . Allergy   . Anemia   . GERD (gastroesophageal reflux disease)   . Hypertension   . Osteoarthritis   . Hyperlipidemia   . BPH (benign prostatic hyperplasia)     Dr. Achilles Dunk  . Diastolic CHF, acute   . S/P ablation of atrial fibrillation   . Anxiety   . 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 Diamond Nickel  . Allergic rhinitis   . CAD (coronary artery disease)     Dr. Diamond Nickel  . Diabetes mellitus type II   . Atrial flutter 07/1999  . Atrial tachycardia 03/2000  . Chest pain 03/2000    Past Surgical History  Procedure Date  . Angioplasty     multiple, and stents  . Coronary artery bypass graft 01/2000  . Circumcision 12/2005  . Coronary stent placement 07/2003    RCA  . Circumcision 12/2005    Family History  Problem Relation Age of Onset  . Diabetes Mother   . Kidney disease Mother     ESRD  . Heart disease Father     CVA  . Stroke Father 34    deceased from same  . Hypertension Sister   . Coronary artery disease Sister     History    Social History  . Marital Status: Married    Spouse Name: N/A    Number of Children: 4  . Years of Education: N/A   Occupational History  . retired     disabled due to CAD   Social History Main Topics  . Smoking status: Never Smoker   . Smokeless tobacco: Former Neurosurgeon    Types: Chew    Quit date: 01/30/1985  . Alcohol Use: No     Quit 30 years ago  . Drug Use: No  . Sexually Active: Not on file   Other Topics Concern  . Not on file   Social History Narrative   Married with 4 children   Review of Systems Not sleeping well---up and can't get back to sleep Has gone back to bed in AM which is unusual    Objective:   Physical Exam  Constitutional: He appears well-developed and well-nourished. No distress.  Neck: Normal range of motion.  Cardiovascular: Normal rate, regular rhythm and normal heart sounds.  Exam reveals no gallop.   No murmur heard. Pulmonary/Chest: Effort normal and breath sounds normal. No respiratory distress. He has no wheezes. He has no rales.  Musculoskeletal:       Thick calves without pitting  R slightly >L  Lymphadenopathy:    He has no cervical adenopathy.  Psychiatric: He has a normal mood and affect. His behavior is normal. Judgment and thought content normal.          Assessment & Plan:

## 2011-01-27 NOTE — Assessment & Plan Note (Signed)
This is new Will stop the statin for now Check hepatitis profile

## 2011-01-27 NOTE — Patient Instructions (Signed)
Continue  5 mg daily, 7.5 mg Tues, Thur. Recheck 4 weeks

## 2011-01-30 LAB — HEPATITIS PANEL, ACUTE
HCV Ab: NEGATIVE
Hep A IgM: NEGATIVE
Hepatitis B Surface Ag: NEGATIVE

## 2011-02-04 ENCOUNTER — Other Ambulatory Visit: Payer: Self-pay | Admitting: Cardiovascular Disease

## 2011-02-06 ENCOUNTER — Telehealth: Payer: Self-pay

## 2011-02-06 MED ORDER — ISOSORBIDE MONONITRATE ER 30 MG PO TB24: 30.0000 mg | ORAL_TABLET | Freq: Two times a day (BID) | ORAL | Status: DC

## 2011-02-06 NOTE — Telephone Encounter (Signed)
Refill sent for imdur 30 mg

## 2011-02-24 ENCOUNTER — Ambulatory Visit: Payer: No Typology Code available for payment source

## 2011-02-28 ENCOUNTER — Other Ambulatory Visit: Payer: Self-pay | Admitting: Internal Medicine

## 2011-03-02 ENCOUNTER — Ambulatory Visit (INDEPENDENT_AMBULATORY_CARE_PROVIDER_SITE_OTHER): Payer: No Typology Code available for payment source | Admitting: Internal Medicine

## 2011-03-02 DIAGNOSIS — Z5181 Encounter for therapeutic drug level monitoring: Secondary | ICD-10-CM

## 2011-03-02 DIAGNOSIS — I82409 Acute embolism and thrombosis of unspecified deep veins of unspecified lower extremity: Secondary | ICD-10-CM

## 2011-03-02 DIAGNOSIS — I4891 Unspecified atrial fibrillation: Secondary | ICD-10-CM

## 2011-03-02 DIAGNOSIS — Z7901 Long term (current) use of anticoagulants: Secondary | ICD-10-CM

## 2011-03-02 LAB — POCT INR: INR: 1.1

## 2011-03-02 NOTE — Patient Instructions (Signed)
Decrease eating greens and take 7.5 today and tomorrow then 5 mg sat and sun, recheck mon

## 2011-03-06 ENCOUNTER — Ambulatory Visit: Payer: No Typology Code available for payment source

## 2011-03-06 ENCOUNTER — Other Ambulatory Visit: Payer: Self-pay | Admitting: Internal Medicine

## 2011-03-07 ENCOUNTER — Ambulatory Visit: Payer: No Typology Code available for payment source

## 2011-03-08 ENCOUNTER — Ambulatory Visit (INDEPENDENT_AMBULATORY_CARE_PROVIDER_SITE_OTHER): Payer: No Typology Code available for payment source | Admitting: Internal Medicine

## 2011-03-08 DIAGNOSIS — Z7901 Long term (current) use of anticoagulants: Secondary | ICD-10-CM

## 2011-03-08 DIAGNOSIS — I4891 Unspecified atrial fibrillation: Secondary | ICD-10-CM

## 2011-03-08 DIAGNOSIS — I82409 Acute embolism and thrombosis of unspecified deep veins of unspecified lower extremity: Secondary | ICD-10-CM

## 2011-03-08 DIAGNOSIS — Z5181 Encounter for therapeutic drug level monitoring: Secondary | ICD-10-CM

## 2011-03-08 NOTE — Patient Instructions (Addendum)
Start  5 mg daily except  7.5, Tues, Thurs, sat, recheck 2 weeks

## 2011-03-22 ENCOUNTER — Ambulatory Visit: Payer: No Typology Code available for payment source

## 2011-03-24 ENCOUNTER — Ambulatory Visit: Payer: No Typology Code available for payment source

## 2011-03-27 ENCOUNTER — Ambulatory Visit (INDEPENDENT_AMBULATORY_CARE_PROVIDER_SITE_OTHER): Payer: No Typology Code available for payment source | Admitting: Internal Medicine

## 2011-03-27 DIAGNOSIS — Z7901 Long term (current) use of anticoagulants: Secondary | ICD-10-CM

## 2011-03-27 DIAGNOSIS — I4891 Unspecified atrial fibrillation: Secondary | ICD-10-CM

## 2011-03-27 DIAGNOSIS — I82409 Acute embolism and thrombosis of unspecified deep veins of unspecified lower extremity: Secondary | ICD-10-CM

## 2011-03-27 DIAGNOSIS — Z5181 Encounter for therapeutic drug level monitoring: Secondary | ICD-10-CM

## 2011-03-27 LAB — POCT INR: INR: 2.8

## 2011-03-27 NOTE — Patient Instructions (Addendum)
Continue current dose, check in 4 weeks   5 mg daily except  7.5, Tues, Thurs, sat

## 2011-04-07 ENCOUNTER — Other Ambulatory Visit: Payer: Self-pay | Admitting: Cardiovascular Disease

## 2011-04-10 ENCOUNTER — Telehealth: Payer: Self-pay

## 2011-04-10 MED ORDER — ISOSORBIDE MONONITRATE ER 30 MG PO TB24: 30.0000 mg | ORAL_TABLET | Freq: Two times a day (BID) | ORAL | Status: DC

## 2011-04-10 NOTE — Telephone Encounter (Signed)
Refill sent for imdur.  

## 2011-04-11 ENCOUNTER — Other Ambulatory Visit: Payer: Self-pay | Admitting: *Deleted

## 2011-04-11 MED ORDER — AMIODARONE HCL 200 MG PO TABS: 200.0000 mg | ORAL_TABLET | Freq: Every day | ORAL | Status: DC

## 2011-04-21 ENCOUNTER — Other Ambulatory Visit: Payer: Self-pay | Admitting: *Deleted

## 2011-04-21 MED ORDER — GLIPIZIDE ER 10 MG PO TB24: 10.0000 mg | ORAL_TABLET | Freq: Every day | ORAL | Status: DC

## 2011-04-21 MED ORDER — METOPROLOL TARTRATE 100 MG PO TABS: 100.0000 mg | ORAL_TABLET | Freq: Two times a day (BID) | ORAL | Status: DC

## 2011-04-21 MED ORDER — FUROSEMIDE 80 MG PO TABS: 80.0000 mg | ORAL_TABLET | Freq: Every day | ORAL | Status: DC | PRN

## 2011-04-24 ENCOUNTER — Ambulatory Visit: Payer: No Typology Code available for payment source

## 2011-04-25 ENCOUNTER — Ambulatory Visit (INDEPENDENT_AMBULATORY_CARE_PROVIDER_SITE_OTHER): Payer: No Typology Code available for payment source | Admitting: Internal Medicine

## 2011-04-25 DIAGNOSIS — Z7902 Long term (current) use of antithrombotics/antiplatelets: Secondary | ICD-10-CM

## 2011-04-25 DIAGNOSIS — I4891 Unspecified atrial fibrillation: Secondary | ICD-10-CM

## 2011-04-25 DIAGNOSIS — I82409 Acute embolism and thrombosis of unspecified deep veins of unspecified lower extremity: Secondary | ICD-10-CM

## 2011-04-25 DIAGNOSIS — Z452 Encounter for adjustment and management of vascular access device: Secondary | ICD-10-CM

## 2011-04-25 LAB — POCT INR: INR: 3.2

## 2011-04-25 NOTE — Patient Instructions (Addendum)
Continue current dose, check in 4 weeks   5 mg daily except  7.5, Tues, Thurs, sat  

## 2011-05-23 ENCOUNTER — Ambulatory Visit (INDEPENDENT_AMBULATORY_CARE_PROVIDER_SITE_OTHER): Payer: No Typology Code available for payment source | Admitting: Internal Medicine

## 2011-05-23 ENCOUNTER — Ambulatory Visit: Payer: No Typology Code available for payment source

## 2011-05-23 DIAGNOSIS — Z7902 Long term (current) use of antithrombotics/antiplatelets: Secondary | ICD-10-CM

## 2011-05-23 DIAGNOSIS — Z452 Encounter for adjustment and management of vascular access device: Secondary | ICD-10-CM

## 2011-05-23 DIAGNOSIS — I4891 Unspecified atrial fibrillation: Secondary | ICD-10-CM

## 2011-05-23 DIAGNOSIS — I82409 Acute embolism and thrombosis of unspecified deep veins of unspecified lower extremity: Secondary | ICD-10-CM

## 2011-05-23 LAB — POCT INR: INR: 4

## 2011-05-23 NOTE — Patient Instructions (Signed)
5 mg daily except  7.5 Thurs, sat (hold today then resume, also cut weekly dose by 2.5 mg)check in 2 weeks

## 2011-06-06 ENCOUNTER — Ambulatory Visit (INDEPENDENT_AMBULATORY_CARE_PROVIDER_SITE_OTHER): Payer: No Typology Code available for payment source | Admitting: Internal Medicine

## 2011-06-06 DIAGNOSIS — I82409 Acute embolism and thrombosis of unspecified deep veins of unspecified lower extremity: Secondary | ICD-10-CM

## 2011-06-06 DIAGNOSIS — Z452 Encounter for adjustment and management of vascular access device: Secondary | ICD-10-CM

## 2011-06-06 DIAGNOSIS — Z7902 Long term (current) use of antithrombotics/antiplatelets: Secondary | ICD-10-CM

## 2011-06-06 DIAGNOSIS — I4891 Unspecified atrial fibrillation: Secondary | ICD-10-CM

## 2011-06-06 NOTE — Patient Instructions (Addendum)
5 mg daily except  7.5 mg Tue /Thur, check in 4 weeks while seeing Dr Alphonsus Sias

## 2011-06-14 ENCOUNTER — Ambulatory Visit: Payer: No Typology Code available for payment source | Admitting: Cardiovascular Disease

## 2011-06-23 ENCOUNTER — Other Ambulatory Visit: Payer: Self-pay

## 2011-06-23 MED ORDER — "PEN NEEDLES 5/16"" 30G X 8 MM MISC": 20.0000 [IU] | Status: DC

## 2011-06-23 NOTE — Telephone Encounter (Signed)
[facility] with [facility]  [phone] [facility] called pt out of pen needles. [facility] gave [phone]. # 100 x [phone].

## 2011-06-27 NOTE — Telephone Encounter (Signed)
rx sent to [facility] [place] [facility] [employer] 06/23/11

## 2011-06-30 ENCOUNTER — Ambulatory Visit: Payer: No Typology Code available for payment source

## 2011-06-30 ENCOUNTER — Ambulatory Visit: Payer: No Typology Code available for payment source | Admitting: Internal Medicine

## 2011-07-12 ENCOUNTER — Encounter: Payer: Self-pay | Admitting: Cardiovascular Disease

## 2011-07-12 ENCOUNTER — Ambulatory Visit (INDEPENDENT_AMBULATORY_CARE_PROVIDER_SITE_OTHER): Payer: No Typology Code available for payment source | Admitting: Cardiovascular Disease

## 2011-07-12 VITALS — BP 120/62 | HR 68 | Ht 67.0 in | Wt 233.5 lb

## 2011-07-12 DIAGNOSIS — I5032 Chronic diastolic (congestive) heart failure: Secondary | ICD-10-CM

## 2011-07-12 DIAGNOSIS — I25810 Atherosclerosis of coronary artery bypass graft(s) without angina pectoris: Secondary | ICD-10-CM

## 2011-07-12 DIAGNOSIS — I2510 Atherosclerotic heart disease of native coronary artery without angina pectoris: Secondary | ICD-10-CM

## 2011-07-12 DIAGNOSIS — R609 Edema, unspecified: Secondary | ICD-10-CM

## 2011-07-12 DIAGNOSIS — R0602 Shortness of breath: Secondary | ICD-10-CM

## 2011-07-12 DIAGNOSIS — I10 Essential (primary) hypertension: Secondary | ICD-10-CM

## 2011-07-12 MED ORDER — HYDRALAZINE HCL 50 MG PO TABS: 50.0000 mg | ORAL_TABLET | Freq: Three times a day (TID) | ORAL | Status: DC

## 2011-07-12 NOTE — Assessment & Plan Note (Signed)
Etiology uncertain. Fluid status is difficult to determine given underlying renal dysfunction, component of venous insufficiency. Echocardiogram has been ordered to help guide diuretic regimen.

## 2011-07-12 NOTE — Progress Notes (Signed)
Patient ID: [name], male    DOB: 1931/12/01, 78 y.o.   MRN: [id number]  HPI Comments: 78 yo AAM with history of CAD s/p CABG and stent in [place] in 2007, HTN, DM, paroxysmal atrial fibrillation, history of diastolic heart failure, Hyperlipidemia, CRI,  and known PVD with reduced ABI in the past who Presents today for routine followup.  On his last clinic visit, he reported having leg edema, pain in his legs with walking, shortness of breath, fatigue.  He reports having shortness of breath with exertion. Edema is somewhat worse on the right than the left. He continues to have poor energy, occasional chest tightness. His biggest complaint is his shortness of breath and his leg edema. He did not bring his medications with him today and does report having changes by Dr. Cherylann Ratel. He also has a capsule given to him by Dr. Achilles Dunk that he does not know the name.  Stress test May 2012 shows no ischemia Lower extremity ultrasound in 2012 showing greater than 50% left SFA disease  EKG shows sinus bradycardia with rate 61 beats per minute with nonspecific ST abnormality in the anterolateral leads, prolonged QTC       Outpatient Encounter Prescriptions as of 07/12/2011  Medication Sig Dispense Refill  . acetaminophen (TYLENOL) 500 MG tablet Take 500 mg by mouth every 6 (six) hours as needed.        Marland Kitchen amiodarone (PACERONE) 200 MG tablet Take 1 tablet (200 mg total) by mouth daily.  90 tablet  0  . amLODipine (NORVASC) 10 MG tablet TAKE 1 TABLET BY MOUTH EVERY DAY  30 tablet  10  . aspirin 81 MG tablet Take 81 mg by mouth daily.        . clopidogrel (PLAVIX) 75 MG tablet Take 75 mg by mouth daily.        . fluticasone (FLONASE) 50 MCG/ACT nasal spray 2 sprays by Nasal route as needed.        . furosemide (LASIX) 80 MG tablet Take 80 mg by mouth daily.      Marland Kitchen glipiZIDE (GLUCOTROL XL) 10 MG 24 hr tablet Take 1 tablet (10 mg total) by mouth daily.  90 tablet  3  . glucose blood (BAYER CONTOUR TEST) test  strip Use as instructed, check blood sugar three times daily       . insulin detemir (LEVEMIR FLEXPEN) 100 UNIT/ML injection Inject 20 Units into the skin daily.  9 mL  12  . Insulin Pen Needle (PEN NEEDLES 5/16") 30G X 8 MM MISC Inject 20 Units into the skin as directed. As directed.  100 each  6  . isosorbide mononitrate (IMDUR) 30 MG 24 hr tablet Take 1 tablet (30 mg total) by mouth 2 (two) times daily.  60 tablet  6  . metoprolol (LOPRESSOR) 100 MG tablet Take 1 tablet (100 mg total) by mouth 2 (two) times daily.  180 tablet  3  . Multiple Vitamin (MULTIVITAMIN) tablet Take 1 tablet by mouth daily.        . nitroGLYCERIN (NITROSTAT) 0.4 MG SL tablet Place 1 tablet (0.4 mg total) under the tongue every 5 (five) minutes as needed.  30 tablet  3  . omeprazole (PRILOSEC) 20 MG capsule Take 20 mg by mouth daily.        . potassium chloride SA (K-DUR,KLOR-CON) 20 MEQ tablet Take 20 mEq by mouth daily. If the furosemide is taken      . pravastatin (PRAVACHOL) 40 MG tablet  TAKE 1 TABLET BY MOUTH EVERY DAY  30 tablet  10  . Tamsulosin HCl (FLOMAX) 0.4 MG CAPS Take 0.4 mg by mouth.        . warfarin (COUMADIN) 5 MG tablet TAKE 1+1/2 TABLETS DAILY OR AS DIRECTED  60 tablet  7   Review of Systems  Constitutional: Positive for fatigue.  HENT: Negative.   Eyes: Negative.   Respiratory: Positive for shortness of breath.   Cardiovascular: Positive for leg swelling.  Gastrointestinal: Negative.   Musculoskeletal: Negative.   Skin: Negative.   Neurological: Positive for weakness.  Hematological: Negative.   Psychiatric/Behavioral: Negative.   All other systems reviewed and are negative.   BP 120/62  Pulse 68  Ht 5\' 7"  (1.702 m)  Wt 233 lb 8 oz (105.915 kg)  BMI 36.57 kg/m2  Physical Exam  Nursing note and vitals reviewed. Constitutional: He is oriented to person, place, and time. He appears well-developed and well-nourished.  HENT:  Head: Normocephalic.  Nose: Nose normal.  Mouth/Throat:  Oropharynx is clear and moist.  Eyes: Conjunctivae are normal. Pupils are equal, round, and reactive to light.  Neck: Normal range of motion. Neck supple. No JVD present.  Cardiovascular: Normal rate, regular rhythm, S1 normal, S2 normal and intact distal pulses.  Exam reveals no gallop and no friction rub.   Murmur heard.  Systolic murmur is present with a grade of 2/6       2+ lower extremity edema to the knees bilaterally  Pulmonary/Chest: Effort normal and breath sounds normal. No respiratory distress. He has no wheezes. He has no rales. He exhibits no tenderness.  Abdominal: Soft. Bowel sounds are normal. He exhibits no distension. There is no tenderness.  Musculoskeletal: Normal range of motion. He exhibits edema. He exhibits no tenderness.  Lymphadenopathy:    He has no cervical adenopathy.  Neurological: He is alert and oriented to person, place, and time. Coordination normal.  Skin: Skin is warm and dry. No rash noted. No erythema.  Psychiatric: He has a normal mood and affect. His behavior is normal. Judgment and thought content normal.           Assessment and Plan

## 2011-07-12 NOTE — Assessment & Plan Note (Signed)
We will hold his amlodipine, start hydralazine twice a day to see if this improves his edema. We do not have his blood work and we'll not advance his diuretic regimen.

## 2011-07-12 NOTE — Assessment & Plan Note (Signed)
We have suggested he stay on his Lasix. We'll try to obtain the most recent blood work from Dr. Cherylann Ratel. Recent labs in our system show worsening renal function.

## 2011-07-12 NOTE — Patient Instructions (Addendum)
You are doing well. Hold the amlodipine Start hydralazine 50 mg twice a day  We will set you up for an echocardiogram for your edema and shortness of breath  Please call us if you have new issues that need to be addressed before your next appt.  Your physician wants you to follow-up in: 1 months.

## 2011-07-12 NOTE — Assessment & Plan Note (Signed)
Will hold amlodipine given possible side effects, start hydralazine.

## 2011-07-12 NOTE — Assessment & Plan Note (Signed)
Negative stress test last year. Shortness of breath likely from weight gain and deconditioning. Echocardiogram pending. Unable to repeat catheterization given the normal dysfunction.

## 2011-07-24 ENCOUNTER — Ambulatory Visit (INDEPENDENT_AMBULATORY_CARE_PROVIDER_SITE_OTHER): Payer: No Typology Code available for payment source | Admitting: Family Medicine

## 2011-07-24 DIAGNOSIS — Z452 Encounter for adjustment and management of vascular access device: Secondary | ICD-10-CM

## 2011-07-24 DIAGNOSIS — I82409 Acute embolism and thrombosis of unspecified deep veins of unspecified lower extremity: Secondary | ICD-10-CM

## 2011-07-24 DIAGNOSIS — Z7901 Long term (current) use of anticoagulants: Secondary | ICD-10-CM

## 2011-07-24 DIAGNOSIS — Z7902 Long term (current) use of antithrombotics/antiplatelets: Secondary | ICD-10-CM

## 2011-07-24 DIAGNOSIS — I4891 Unspecified atrial fibrillation: Secondary | ICD-10-CM

## 2011-07-24 LAB — POCT INR: INR: 2

## 2011-07-24 NOTE — Patient Instructions (Signed)
Continue current dose, check in 4 weeks  

## 2011-08-01 ENCOUNTER — Other Ambulatory Visit (INDEPENDENT_AMBULATORY_CARE_PROVIDER_SITE_OTHER): Payer: No Typology Code available for payment source

## 2011-08-01 ENCOUNTER — Other Ambulatory Visit: Payer: Self-pay

## 2011-08-01 DIAGNOSIS — R0602 Shortness of breath: Secondary | ICD-10-CM

## 2011-08-01 DIAGNOSIS — R079 Chest pain, unspecified: Secondary | ICD-10-CM

## 2011-08-01 DIAGNOSIS — I2510 Atherosclerotic heart disease of native coronary artery without angina pectoris: Secondary | ICD-10-CM

## 2011-08-09 ENCOUNTER — Ambulatory Visit: Payer: No Typology Code available for payment source | Admitting: Cardiovascular Disease

## 2011-08-17 ENCOUNTER — Encounter: Payer: Self-pay | Admitting: Cardiovascular Disease

## 2011-08-17 ENCOUNTER — Ambulatory Visit (INDEPENDENT_AMBULATORY_CARE_PROVIDER_SITE_OTHER): Payer: No Typology Code available for payment source | Admitting: Cardiovascular Disease

## 2011-08-17 VITALS — BP 110/50 | HR 60 | Ht 67.0 in | Wt 225.2 lb

## 2011-08-17 DIAGNOSIS — R748 Abnormal levels of other serum enzymes: Secondary | ICD-10-CM

## 2011-08-17 DIAGNOSIS — N289 Disorder of kidney and ureter, unspecified: Secondary | ICD-10-CM

## 2011-08-17 DIAGNOSIS — I10 Essential (primary) hypertension: Secondary | ICD-10-CM

## 2011-08-17 DIAGNOSIS — E119 Type 2 diabetes mellitus without complications: Secondary | ICD-10-CM

## 2011-08-17 DIAGNOSIS — I7389 Other specified peripheral vascular diseases: Secondary | ICD-10-CM

## 2011-08-17 DIAGNOSIS — E7800 Pure hypercholesterolemia, unspecified: Secondary | ICD-10-CM

## 2011-08-17 DIAGNOSIS — I25810 Atherosclerosis of coronary artery bypass graft(s) without angina pectoris: Secondary | ICD-10-CM

## 2011-08-17 DIAGNOSIS — I2510 Atherosclerotic heart disease of native coronary artery without angina pectoris: Secondary | ICD-10-CM

## 2011-08-17 DIAGNOSIS — N259 Disorder resulting from impaired renal tubular function, unspecified: Secondary | ICD-10-CM

## 2011-08-17 MED ORDER — CLOPIDOGREL BISULFATE 75 MG PO TABS: 75.0000 mg | ORAL_TABLET | Freq: Every day | ORAL | Status: DC

## 2011-08-17 NOTE — Assessment & Plan Note (Signed)
We have encouraged continued exercise, careful diet management in an effort to lose weight. 

## 2011-08-17 NOTE — Patient Instructions (Addendum)
You are doing well. No medication changes were made.  We will do some blood work today  Please call us if you have new issues that need to be addressed before your next appt.  Your physician wants you to follow-up in: 6 months.  You will receive a reminder letter in the mail two months in advance. If you don't receive a letter, please call our office to schedule the follow-up appointment.

## 2011-08-17 NOTE — Assessment & Plan Note (Signed)
Blood pressure is well controlled on his current medication regimen. No changes made.

## 2011-08-17 NOTE — Assessment & Plan Note (Signed)
We will repeat LFTs. He has been holding pravastatin. If LFTs continue to be elevated, we'll need to reevaluate need for amiodarone

## 2011-08-17 NOTE — Assessment & Plan Note (Signed)
We'll need to reestablish aggressive cholesterol management. We have urged better diabetes control.

## 2011-08-17 NOTE — Assessment & Plan Note (Signed)
Atypical type chest pain on the left. Negative stress test last year. I suggested he call the office for worsening chest pain and we would repeat a stress test. Not a good candidate for cardiac catheterization given renal dysfunction

## 2011-08-17 NOTE — Assessment & Plan Note (Signed)
Will repeat lab work today

## 2011-08-17 NOTE — Assessment & Plan Note (Signed)
Worsening diabetes control. Worsening renal function over the past several years. Followed by Dr. Cherylann Ratel.

## 2011-08-17 NOTE — Progress Notes (Signed)
Patient ID: [name], male    DOB: 1931/10/20, 78 y.o.   MRN: [id number]  HPI Comments: 78 yo AAM with history of CAD s/p CABG and stent in [place] in 2007, HTN, DM, paroxysmal atrial fibrillation, history of diastolic heart failure, Hyperlipidemia, CRI,  and known PVD with reduced ABI in the past who Presents today for routine followup.  Overall he reports that he is doing well. His leg edema has improved by holding amlodipine. Blood pressure has been well-controlled. His weight is down 8 pounds since his last clinic visit one month ago. His wife is managing his insulin and gives him 20 units every morning. Sugars have been running in the 200 range or higher. He continues to have poor diet. He is known for his "potato salad".   He has been having crampy type left chest pain that comes on sometimes with lifting items. Also has itching in the center of his chest, left of his mediastinal scar. Renal function has been worse, last checked 7 months ago by [employer] with creatinine of 3, elevated LFTs. Statin was held at that time.   Stress test May 2012 shows no ischemia Lower extremity ultrasound in 2012 showing greater than 50% left SFA disease  EKG shows sinus bradycardia with rate 54 beats per minute with prolonged QTC       Outpatient Encounter Prescriptions as of 08/17/2011  Medication Sig Dispense Refill  . acetaminophen (TYLENOL) 500 MG tablet Take 500 mg by mouth every 6 (six) hours as needed.        Marland Kitchen amiodarone (PACERONE) 200 MG tablet Take 1 tablet (200 mg total) by mouth daily.  90 tablet  0  . aspirin 81 MG tablet Take 81 mg by mouth daily.        . fluticasone (FLONASE) 50 MCG/ACT nasal spray 2 sprays by Nasal route as needed.        . furosemide (LASIX) 80 MG tablet Take 80 mg by mouth daily.      Marland Kitchen glipiZIDE (GLUCOTROL XL) 10 MG 24 hr tablet Take 1 tablet (10 mg total) by mouth daily.  90 tablet  3  . glucose blood (BAYER CONTOUR TEST) test strip Use as instructed, check blood  sugar three times daily       . hydrALAZINE (APRESOLINE) 50 MG tablet Take 1 tablet (50 mg total) by mouth 3 (three) times daily.  90 tablet  6  . insulin detemir (LEVEMIR FLEXPEN) 100 UNIT/ML injection Inject 20 Units into the skin daily.  9 mL  12  . Insulin Pen Needle (PEN NEEDLES 5/16") 30G X 8 MM MISC Inject 20 Units into the skin as directed. As directed.  100 each  6  . isosorbide mononitrate (IMDUR) 30 MG 24 hr tablet Take 1 tablet (30 mg total) by mouth 2 (two) times daily.  60 tablet  6  . metoprolol (LOPRESSOR) 100 MG tablet Take 1 tablet (100 mg total) by mouth 2 (two) times daily.  180 tablet  3  . Multiple Vitamin (MULTIVITAMIN) tablet Take 1 tablet by mouth daily.        . nitroGLYCERIN (NITROSTAT) 0.4 MG SL tablet Place 1 tablet (0.4 mg total) under the tongue every 5 (five) minutes as needed.  30 tablet  3  . omeprazole (PRILOSEC) 20 MG capsule Take 20 mg by mouth daily.        . potassium chloride SA (K-DUR,KLOR-CON) 20 MEQ tablet Take 20 mEq by mouth daily. If the furosemide is  taken      . pravastatin (PRAVACHOL) 40 MG tablet TAKE 1 TABLET BY MOUTH EVERY DAY (Not taking)  30 tablet  10  . Tamsulosin HCl (FLOMAX) 0.4 MG CAPS Take 0.4 mg by mouth.        . warfarin (COUMADIN) 5 MG tablet TAKE 1+1/2 TABLETS DAILY OR AS DIRECTED  60 tablet  7  .  clopidogrel (PLAVIX) 75 MG tablet Take 75 mg by mouth daily.   Not taking?        Review of Systems  HENT: Negative.   Eyes: Negative.   Respiratory: Positive for shortness of breath.   Cardiovascular:       Rare crampy type left chest wall pain  Gastrointestinal: Negative.   Musculoskeletal: Negative.   Skin: Negative.        Chest itching  Hematological: Negative.   Psychiatric/Behavioral: Negative.   All other systems reviewed and are negative.   BP 110/50  Pulse 60  Ht 5\' 7"  (1.702 m)  Wt 225 lb 4 oz (102.173 kg)  BMI 35.28 kg/m2  Physical Exam  Nursing note and vitals reviewed. Constitutional: He is oriented to  person, place, and time. He appears well-developed and well-nourished.  HENT:  Head: Normocephalic.  Nose: Nose normal.  Mouth/Throat: Oropharynx is clear and moist.  Eyes: Conjunctivae are normal. Pupils are equal, round, and reactive to light.  Neck: Normal range of motion. Neck supple. No JVD present.  Cardiovascular: Normal rate, regular rhythm, S1 normal, S2 normal and intact distal pulses.  Exam reveals no gallop and no friction rub.   Murmur heard.  Systolic murmur is present with a grade of 2/6       Trace bilateral lower edema  Pulmonary/Chest: Effort normal and breath sounds normal. No respiratory distress. He has no wheezes. He has no rales. He exhibits no tenderness.  Abdominal: Soft. Bowel sounds are normal. He exhibits no distension. There is no tenderness.  Musculoskeletal: Normal range of motion. He exhibits edema. He exhibits no tenderness.  Lymphadenopathy:    He has no cervical adenopathy.  Neurological: He is alert and oriented to person, place, and time. Coordination normal.  Skin: Skin is warm and dry. No rash noted. No erythema.  Psychiatric: He has a normal mood and affect. His behavior is normal. Judgment and thought content normal.           Assessment and Plan

## 2011-08-18 LAB — BASIC METABOLIC PANEL
BUN/Creatinine Ratio: 17 (ref 10–22)
CO2: 22 mmol/L (ref 19–28)
Calcium: 9.2 mg/dL (ref 8.6–10.2)
Chloride: 102 mmol/L (ref 97–108)
Potassium: 5.1 mmol/L (ref 3.5–5.2)
Sodium: 135 mmol/L (ref 134–144)

## 2011-08-18 LAB — HEPATIC FUNCTION PANEL
AST: 54 IU/L — ABNORMAL HIGH (ref 0–40)
Albumin: 3.9 g/dL (ref 3.5–4.8)
Total Bilirubin: 0.5 mg/dL (ref 0.0–1.2)
Total Protein: 7.2 g/dL (ref 6.0–8.5)

## 2011-08-18 LAB — LIPID PANEL
Chol/HDL Ratio: 8.6 ratio units — ABNORMAL HIGH (ref 0.0–5.0)
HDL: 34 mg/dL — ABNORMAL LOW (ref >39)
LDL Calculated: 220 mg/dL — ABNORMAL HIGH (ref 0–99)
VLDL Cholesterol Cal: 38 mg/dL (ref 5–40)

## 2011-08-21 ENCOUNTER — Ambulatory Visit (INDEPENDENT_AMBULATORY_CARE_PROVIDER_SITE_OTHER): Payer: No Typology Code available for payment source | Admitting: Family Medicine

## 2011-08-21 DIAGNOSIS — I4891 Unspecified atrial fibrillation: Secondary | ICD-10-CM

## 2011-08-21 DIAGNOSIS — I82409 Acute embolism and thrombosis of unspecified deep veins of unspecified lower extremity: Secondary | ICD-10-CM

## 2011-08-21 DIAGNOSIS — Z7901 Long term (current) use of anticoagulants: Secondary | ICD-10-CM

## 2011-08-21 DIAGNOSIS — Z7902 Long term (current) use of antithrombotics/antiplatelets: Secondary | ICD-10-CM

## 2011-08-21 DIAGNOSIS — Z452 Encounter for adjustment and management of vascular access device: Secondary | ICD-10-CM

## 2011-08-21 NOTE — Patient Instructions (Signed)
5 mg daily except  7.5 mg MWF(increased by 2.5 mg weekly) check in 2 weeks

## 2011-08-25 ENCOUNTER — Other Ambulatory Visit: Payer: Self-pay

## 2011-08-25 MED ORDER — ATORVASTATIN CALCIUM 10 MG PO TABS: 10.0000 mg | ORAL_TABLET | Freq: Every day | ORAL | Status: DC

## 2011-09-04 ENCOUNTER — Ambulatory Visit (INDEPENDENT_AMBULATORY_CARE_PROVIDER_SITE_OTHER): Payer: No Typology Code available for payment source | Admitting: Family Medicine

## 2011-09-04 DIAGNOSIS — Z7901 Long term (current) use of anticoagulants: Secondary | ICD-10-CM

## 2011-09-04 DIAGNOSIS — Z452 Encounter for adjustment and management of vascular access device: Secondary | ICD-10-CM

## 2011-09-04 DIAGNOSIS — I82409 Acute embolism and thrombosis of unspecified deep veins of unspecified lower extremity: Secondary | ICD-10-CM

## 2011-09-04 DIAGNOSIS — I4891 Unspecified atrial fibrillation: Secondary | ICD-10-CM

## 2011-09-04 DIAGNOSIS — Z7902 Long term (current) use of antithrombotics/antiplatelets: Secondary | ICD-10-CM

## 2011-09-04 NOTE — Patient Instructions (Signed)
Continue current dose, check in 4 weeks  

## 2011-09-06 ENCOUNTER — Other Ambulatory Visit: Payer: Self-pay | Admitting: *Deleted

## 2011-09-06 MED ORDER — METOPROLOL TARTRATE 100 MG PO TABS: 100.0000 mg | ORAL_TABLET | Freq: Two times a day (BID) | ORAL | Status: DC

## 2011-09-12 ENCOUNTER — Encounter: Payer: Self-pay | Admitting: Internal Medicine

## 2011-09-12 ENCOUNTER — Ambulatory Visit (INDEPENDENT_AMBULATORY_CARE_PROVIDER_SITE_OTHER): Payer: No Typology Code available for payment source | Admitting: Internal Medicine

## 2011-09-12 ENCOUNTER — Ambulatory Visit: Payer: No Typology Code available for payment source | Admitting: Internal Medicine

## 2011-09-12 VITALS — BP 160/80 | HR 54 | Temp 97.7°F | Ht 67.0 in | Wt 225.0 lb

## 2011-09-12 DIAGNOSIS — M542 Cervicalgia: Secondary | ICD-10-CM | POA: Insufficient documentation

## 2011-09-12 DIAGNOSIS — E1129 Type 2 diabetes mellitus with other diabetic kidney complication: Secondary | ICD-10-CM

## 2011-09-12 DIAGNOSIS — N058 Unspecified nephritic syndrome with other morphologic changes: Secondary | ICD-10-CM

## 2011-09-12 DIAGNOSIS — R413 Other amnesia: Secondary | ICD-10-CM

## 2011-09-12 NOTE — Patient Instructions (Signed)
Please try acetaminophen 650mg  up to three times a day for the back pain Set up the physical therapy appointment

## 2011-09-12 NOTE — Assessment & Plan Note (Signed)
Ongoing cognitive problems Now notes perceptual issues---will awake having sense of seeing things, like spiders

## 2011-09-12 NOTE — Progress Notes (Signed)
Subjective:    Patient ID: [name], male    DOB: Jun 05, 1931, 78 y.o.   MRN: [id number]  HPI Here with wife Having headaches and pain in low back Both are new Started 2 weeks ago with back pain Headaches go back a month  Occur daily Not clearly related but come together sometimes Back pain occ in AM Notes trying to get in car and getting up after bending down  Headache comes and goes---occ all day (like 2 days ago) Blunt pain on right side up to frontal area No pounding Occ photophobia with the bright lights Not clearly worse with movement  Hasn't tried any Rx for these No topical Rx for back either  Diabetes control is better Wife had to increase lantus one day for being over 200 Otherwise in the 100's Checking daily No clinical hypoglycemia  Current Outpatient Prescriptions on File Prior to Visit  Medication Sig Dispense Refill  . acetaminophen (TYLENOL) 500 MG tablet Take 500 mg by mouth every 6 (six) hours as needed.        Marland Kitchen amiodarone (PACERONE) 200 MG tablet Take 1 tablet (200 mg total) by mouth daily.  90 tablet  0  . aspirin 81 MG tablet Take 81 mg by mouth daily.        Marland Kitchen atorvastatin (LIPITOR) 10 MG tablet Take 1 tablet (10 mg total) by mouth daily.  90 tablet  3  . clopidogrel (PLAVIX) 75 MG tablet Take 1 tablet (75 mg total) by mouth daily.  30 tablet  11  . fluticasone (FLONASE) 50 MCG/ACT nasal spray 2 sprays by Nasal route as needed.        . furosemide (LASIX) 80 MG tablet Take 80 mg by mouth daily.      Marland Kitchen glipiZIDE (GLUCOTROL XL) 10 MG 24 hr tablet Take 1 tablet (10 mg total) by mouth daily.  90 tablet  3  . glucose blood (BAYER CONTOUR TEST) test strip Use as instructed, check blood sugar three times daily       . hydrALAZINE (APRESOLINE) 50 MG tablet Take 1 tablet (50 mg total) by mouth 3 (three) times daily.  90 tablet  6  . insulin detemir (LEVEMIR FLEXPEN) 100 UNIT/ML injection Inject 20 Units into the skin daily.  9 mL  12  . Insulin Pen  Needle (PEN NEEDLES 5/16") 30G X 8 MM MISC Inject 20 Units into the skin as directed. As directed.  100 each  6  . isosorbide mononitrate (IMDUR) 30 MG 24 hr tablet Take 1 tablet (30 mg total) by mouth 2 (two) times daily.  60 tablet  6  . metoprolol (LOPRESSOR) 100 MG tablet Take 1 tablet (100 mg total) by mouth 2 (two) times daily.  180 tablet  3  . Multiple Vitamin (MULTIVITAMIN) tablet Take 1 tablet by mouth daily.        . nitroGLYCERIN (NITROSTAT) 0.4 MG SL tablet Place 1 tablet (0.4 mg total) under the tongue every 5 (five) minutes as needed.  30 tablet  3  . omeprazole (PRILOSEC) 20 MG capsule Take 20 mg by mouth daily.        . potassium chloride SA (K-DUR,KLOR-CON) 20 MEQ tablet Take 20 mEq by mouth daily. If the furosemide is taken      . Tamsulosin HCl (FLOMAX) 0.4 MG CAPS Take 0.4 mg by mouth.        . warfarin (COUMADIN) 5 MG tablet TAKE 1+1/2 TABLETS DAILY OR AS DIRECTED  60 tablet  7    Allergies  Allergen Reactions  . Amoxicillin-Pot Clavulanate     REACTION: unknown  . Buspirone Hcl     REACTION: unknown  . Sulfonamide Derivatives     REACTION: unspecified    Past Medical History  Diagnosis Date  . Allergy   . Anemia   . GERD (gastroesophageal reflux disease)   . Hypertension   . Osteoarthritis   . Hyperlipidemia   . BPH (benign prostatic hyperplasia)     Dr. Achilles Dunk  . Diastolic CHF, acute   . S/P ablation of atrial fibrillation   . Anxiety   . 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 Diamond Nickel  . Allergic rhinitis   . CAD (coronary artery disease)     Dr. Diamond Nickel  . Diabetes mellitus type II   . Atrial flutter 07/1999  . Atrial tachycardia 03/2000  . Chest pain 03/2000    Past Surgical History  Procedure Date  . Angioplasty     multiple, and stents  . Coronary artery bypass graft 01/2000  . Circumcision 12/2005  . Coronary stent placement 07/2003    RCA  . Circumcision 12/2005    Family History  Problem Relation Age of Onset  . Diabetes  Mother   . Kidney disease Mother     ESRD  . Heart disease Father     CVA  . Stroke Father 48    deceased from same  . Hypertension Sister   . Coronary artery disease Sister     History   Social History  . Marital Status: Married    Spouse Name: N/A    Number of Children: 4  . Years of Education: N/A   Occupational History  . retired     disabled due to CAD   Social History Main Topics  . Smoking status: Never Smoker   . Smokeless tobacco: Former Neurosurgeon    Types: Chew    Quit date: 01/30/1985  . Alcohol Use: No     Quit 30 years ago  . Drug Use: No  . Sexually Active: Not on file   Other Topics Concern  . Not on file   Social History Narrative   Married with 4 children   Review of Systems No hematuria Some lower abdominal discomfort---vague    Objective:   Physical Exam  Constitutional: He appears well-developed and well-nourished. No distress.  Neck:       Keeps neck flexed Can't extend past neutral position Tight traps especially Lump in right occiput---seems like cyst (but could be non inflamed node)  Musculoskeletal:       Back flexion to almost 90 degrees SLR negative for back pain Diffuse lumbar tenderness along spine  Neurological:       Slow but steady gait No leg weakness          Assessment & Plan:

## 2011-09-12 NOTE — Assessment & Plan Note (Signed)
Seems to be genesis of his headache Has very limited extension and spasm in traps Could be related to back pain as well Will set up PT eval Okay to use tylenol

## 2011-09-12 NOTE — Assessment & Plan Note (Signed)
Seems to have better control now lantus 20 daily No hypoglycemia Will check A1c and consider slow increase in insulin as needed

## 2011-09-14 ENCOUNTER — Encounter: Payer: Self-pay | Admitting: *Deleted

## 2011-10-03 ENCOUNTER — Ambulatory Visit (INDEPENDENT_AMBULATORY_CARE_PROVIDER_SITE_OTHER): Payer: No Typology Code available for payment source | Admitting: Internal Medicine

## 2011-10-03 DIAGNOSIS — Z452 Encounter for adjustment and management of vascular access device: Secondary | ICD-10-CM

## 2011-10-03 DIAGNOSIS — Z7902 Long term (current) use of antithrombotics/antiplatelets: Secondary | ICD-10-CM

## 2011-10-03 DIAGNOSIS — I82409 Acute embolism and thrombosis of unspecified deep veins of unspecified lower extremity: Secondary | ICD-10-CM

## 2011-10-03 DIAGNOSIS — I4891 Unspecified atrial fibrillation: Secondary | ICD-10-CM

## 2011-10-03 NOTE — Patient Instructions (Signed)
Continue current dose, check in 4 weeks  

## 2011-10-05 ENCOUNTER — Encounter: Payer: Self-pay | Admitting: Internal Medicine

## 2011-10-05 ENCOUNTER — Ambulatory Visit (INDEPENDENT_AMBULATORY_CARE_PROVIDER_SITE_OTHER): Payer: No Typology Code available for payment source | Admitting: Internal Medicine

## 2011-10-05 VITALS — BP 150/70 | HR 57 | Temp 97.7°F | Resp 18 | Ht 67.0 in | Wt 226.0 lb

## 2011-10-05 DIAGNOSIS — J019 Acute sinusitis, unspecified: Secondary | ICD-10-CM

## 2011-10-05 MED ORDER — CEFUROXIME AXETIL 250 MG PO TABS
250.0000 mg | ORAL_TABLET | Freq: Two times a day (BID) | ORAL | Status: AC
Start: 2011-10-05 — End: 2011-10-15

## 2011-10-05 MED ORDER — BENZONATATE 200 MG PO CAPS: 200.0000 mg | ORAL_CAPSULE | Freq: Three times a day (TID) | ORAL | Status: AC | PRN

## 2011-10-05 NOTE — Assessment & Plan Note (Signed)
May have bacterial infection now Will treat with ceftin and give cough med

## 2011-10-05 NOTE — Progress Notes (Signed)
Subjective:    Patient ID: [name], male    DOB: 19-Feb-1931, 78 y.o.   MRN: [id number]  HPI Has had nasal congestion Constant cough for 2 weeks ?hay fever Has had headaches No fever Cough is productive of white mucus Some post nasal drip  Gets left shoulder and chest pain from the cough Some SOB--when he tries to do anything  Back pain along upper lumbar area also---had PT eval and starting to work on this  Current Outpatient Prescriptions on File Prior to Visit  Medication Sig Dispense Refill  . acetaminophen (TYLENOL) 500 MG tablet Take 500 mg by mouth every 6 (six) hours as needed.        Marland Kitchen amiodarone (PACERONE) 200 MG tablet Take 1 tablet (200 mg total) by mouth daily.  90 tablet  0  . aspirin 81 MG tablet Take 81 mg by mouth daily.        Marland Kitchen atorvastatin (LIPITOR) 10 MG tablet Take 1 tablet (10 mg total) by mouth daily.  90 tablet  3  . clopidogrel (PLAVIX) 75 MG tablet Take 1 tablet (75 mg total) by mouth daily.  30 tablet  11  . fluticasone (FLONASE) 50 MCG/ACT nasal spray 2 sprays by Nasal route as needed.        . furosemide (LASIX) 80 MG tablet Take 80 mg by mouth daily.      Marland Kitchen glipiZIDE (GLUCOTROL XL) 10 MG 24 hr tablet Take 1 tablet (10 mg total) by mouth daily.  90 tablet  3  . glucose blood (BAYER CONTOUR TEST) test strip Use as instructed, check blood sugar three times daily       . hydrALAZINE (APRESOLINE) 50 MG tablet Take 1 tablet (50 mg total) by mouth 3 (three) times daily.  90 tablet  6  . insulin detemir (LEVEMIR FLEXPEN) 100 UNIT/ML injection Inject 20 Units into the skin daily.  9 mL  12  . Insulin Pen Needle (PEN NEEDLES 5/16") 30G X 8 MM MISC Inject 20 Units into the skin as directed. As directed.  100 each  6  . isosorbide mononitrate (IMDUR) 30 MG 24 hr tablet Take 1 tablet (30 mg total) by mouth 2 (two) times daily.  60 tablet  6  . metoprolol (LOPRESSOR) 100 MG tablet Take 1 tablet (100 mg total) by mouth 2 (two) times daily.  180 tablet  3  .  Multiple Vitamin (MULTIVITAMIN) tablet Take 1 tablet by mouth daily.        . nitroGLYCERIN (NITROSTAT) 0.4 MG SL tablet Place 1 tablet (0.4 mg total) under the tongue every 5 (five) minutes as needed.  30 tablet  3  . omeprazole (PRILOSEC) 20 MG capsule Take 20 mg by mouth daily.        . potassium chloride SA (K-DUR,KLOR-CON) 20 MEQ tablet Take 20 mEq by mouth daily. If the furosemide is taken      . Tamsulosin HCl (FLOMAX) 0.4 MG CAPS Take 0.4 mg by mouth.        . warfarin (COUMADIN) 5 MG tablet TAKE 1+1/2 TABLETS DAILY OR AS DIRECTED  60 tablet  7    Allergies  Allergen Reactions  . Amoxicillin-Pot Clavulanate     REACTION: unknown  . Buspirone Hcl     REACTION: unknown  . Sulfonamide Derivatives     REACTION: unspecified    Past Medical History  Diagnosis Date  . Allergy   . Anemia   . GERD (gastroesophageal reflux disease)   .  Hypertension   . Osteoarthritis   . Hyperlipidemia   . BPH (benign prostatic hyperplasia)     Dr. Achilles Dunk  . Diastolic CHF, acute   . S/P ablation of atrial fibrillation   . Anxiety   . 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 Diamond Nickel  . Allergic rhinitis   . CAD (coronary artery disease)     Dr. Diamond Nickel  . Diabetes mellitus type II   . Atrial flutter 07/1999  . Atrial tachycardia 03/2000  . Chest pain 03/2000    Past Surgical History  Procedure Date  . Angioplasty     multiple, and stents  . Coronary artery bypass graft 01/2000  . Circumcision 12/2005  . Coronary stent placement 07/2003    RCA  . Circumcision 12/2005    Family History  Problem Relation Age of Onset  . Diabetes Mother   . Kidney disease Mother     ESRD  . Heart disease Father     CVA  . Stroke Father 32    deceased from same  . Hypertension Sister   . Coronary artery disease Sister     History   Social History  . Marital Status: Married    Spouse Name: N/A    Number of Children: 4  . Years of Education: N/A   Occupational History  . retired      disabled due to CAD   Social History Main Topics  . Smoking status: Never Smoker   . Smokeless tobacco: Former Neurosurgeon    Types: Chew    Quit date: 01/30/1985  . Alcohol Use: No     Quit 30 years ago  . Drug Use: No  . Sexually Active: Not on file   Other Topics Concern  . Not on file   Social History Narrative   Married with 4 children   Review of Systems No vomiting or diarrhea Some discomfort in epigastric area    Objective:   Physical Exam  Constitutional: He appears well-developed and well-nourished. No distress.  HENT:  Mouth/Throat: Oropharynx is clear and moist. No oropharyngeal exudate.       Maxillary>frontal tenderness Moderate nasal inflammation and swelling  Neck: Normal range of motion.  Pulmonary/Chest: Effort normal and breath sounds normal. No respiratory distress. He has no wheezes. He has no rales. He exhibits tenderness.       Soreness in chest muscles  Lymphadenopathy:    He has no cervical adenopathy.          Assessment & Plan:

## 2011-10-06 ENCOUNTER — Telehealth: Payer: Self-pay

## 2011-10-06 MED ORDER — "PEN NEEDLES 5/16"" 30G X 8 MM MISC": 20.0000 [IU] | Status: AC

## 2011-10-06 MED ORDER — INSULIN DETEMIR 100 UNIT/ML ~~LOC~~ SOLN: 25.0000 [IU] | Freq: Every day | SUBCUTANEOUS | Status: DC

## 2011-10-06 NOTE — Telephone Encounter (Signed)
rx sent to pharmacy by e-script Spoke with wife it's levemir that needs refills not lantus, pt's insurance doesn't cover lantus.

## 2011-10-06 NOTE — Telephone Encounter (Signed)
[facility] request change from Lantus vial to Lantus solostar pen. Pt takes 10 - 20 units at bedtime.Please advise.

## 2011-10-06 NOTE — Telephone Encounter (Signed)
Okay to switch to the pen Make sure he gets the needed needles Dose is now up to 25 units daily

## 2011-10-09 ENCOUNTER — Encounter: Payer: Self-pay | Admitting: Family Medicine

## 2011-10-09 ENCOUNTER — Ambulatory Visit (INDEPENDENT_AMBULATORY_CARE_PROVIDER_SITE_OTHER): Payer: No Typology Code available for payment source | Admitting: Family Medicine

## 2011-10-09 ENCOUNTER — Ambulatory Visit (INDEPENDENT_AMBULATORY_CARE_PROVIDER_SITE_OTHER)
Admission: RE | Admit: 2011-10-09 | Discharge: 2011-10-09 | Disposition: A | Discharge status: Home / Self Care | Payer: No Typology Code available for payment source | Source: Ambulatory Visit | Attending: Family Medicine | Admitting: Family Medicine

## 2011-10-09 VITALS — BP 200/90 | HR 67 | Temp 98.6°F | Wt 225.5 lb

## 2011-10-09 DIAGNOSIS — M79672 Pain in left foot: Secondary | ICD-10-CM

## 2011-10-09 DIAGNOSIS — M79609 Pain in unspecified limb: Secondary | ICD-10-CM

## 2011-10-09 DIAGNOSIS — M109 Gout, unspecified: Secondary | ICD-10-CM

## 2011-10-09 MED ORDER — HYDROCODONE-ACETAMINOPHEN 5-500 MG PO TABS: 1.0000 | ORAL_TABLET | Freq: Four times a day (QID) | ORAL | Status: AC | PRN

## 2011-10-09 MED ORDER — COLCHICINE 0.6 MG PO TABS: 0.6000 mg | ORAL_TABLET | Freq: Two times a day (BID) | ORAL | Status: DC

## 2011-10-09 MED ORDER — PREDNISONE 20 MG PO TABS: ORAL_TABLET | ORAL | Status: DC

## 2011-10-09 NOTE — Progress Notes (Signed)
Nature conservation officer at [facility] [address] [place] [address] Phone: [phone] Fax: [phone]  Date:  10/09/2011   Name:  [name]   DOB:  1931-02-21   MRN:  [id number] Gender: male Age: 78 y.o.  PCP:  Tillman Abide, MD    Chief Complaint: Foot Pain   History of Present Illness:  [name] is a 78 y.o. pleasant patient who presents with the following:  Pleasant patient who is known well to me for some time presents with acute left-sided foot pain. He has no known history, injury, or trauma. He has had a repeat acute onset of left-sided foot pain and swelling over the last couple of days. No significant bruising. No warmth.   History is complicated by a history of renal insufficiency, elevated liver function enzymes, atrial fibrillation, diabetes, chronic Coumadin usage as well as chronic Plavix usage.  Patient Active Problem List  Diagnosis  . TUBULOVILLOUS ADENOMA, COLON  . DIABETES MELLITUS, TYPE II  . HYPERCHOLESTEROLEMIA  . ANXIETY  . HYPERTENSION  . CORONARY ATHEROSCLEROSIS, ARTERY BYPASS GRAFT  . ATRIAL FIBRILLATION  . DIASTOLIC HEART FAILURE, CHRONIC  . ATHEROSCLEROSIS W /INT CLAUDICATION  . PVD WITH CLAUDICATION  . D V T  . ALLERGIC RHINITIS  . GERD  . DIVERTICULOSIS OF COLON  . RENAL INSUFFICIENCY  . BENIGN PROSTATIC HYPERTROPHY  . LOC OSTEOARTHROS NOT SPEC PRIM/SEC LOWER LEG  . OSTEOARTHRITIS  . ARTHRITIS, RIGHT SHOULDER  . DEGENERATIVE JOINT DISEASE, LUMBAR SPINE  . Type II or unspecified type diabetes mellitus with renal manifestations, uncontrolled  . Memory loss  . Elevated liver enzymes  . Edema  . Shortness of breath  . Cervical pain (neck)  . Acute sinusitis    Past Medical History  Diagnosis Date  . Allergy   . Anemia   . GERD (gastroesophageal reflux disease)   . Hypertension   . Osteoarthritis   . Hyperlipidemia   . BPH (benign prostatic hyperplasia)     Dr. Achilles Dunk  . Diastolic CHF, acute   . S/P  ablation of atrial fibrillation   . Anxiety   . 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 Diamond Nickel  . Allergic rhinitis   . CAD (coronary artery disease)     Dr. Diamond Nickel  . Diabetes mellitus type II   . Atrial flutter 07/1999  . Atrial tachycardia 03/2000  . Chest pain 03/2000    Past Surgical History  Procedure Date  . Angioplasty     multiple, and stents  . Coronary artery bypass graft 01/2000  . Circumcision 12/2005  . Coronary stent placement 07/2003    RCA  . Circumcision 12/2005    History  Substance Use Topics  . Smoking status: Never Smoker   . Smokeless tobacco: Former Neurosurgeon    Types: Chew    Quit date: 01/30/1985  . Alcohol Use: No     Quit 30 years ago    Family History  Problem Relation Age of Onset  . Diabetes Mother   . Kidney disease Mother     ESRD  . Heart disease Father     CVA  . Stroke Father 37    deceased from same  . Hypertension Sister   . Coronary artery disease Sister     Allergies  Allergen Reactions  . Buspirone Hcl     REACTION: unknown  . Sulfonamide Derivatives     REACTION: unspecified    Medication list has been reviewed and  updated.  Current Outpatient Prescriptions on File Prior to Visit  Medication Sig Dispense Refill  . acetaminophen (TYLENOL) 500 MG tablet Take 500 mg by mouth every 6 (six) hours as needed.        Marland Kitchen amiodarone (PACERONE) 200 MG tablet Take 1 tablet (200 mg total) by mouth daily.  90 tablet  0  . aspirin 81 MG tablet Take 81 mg by mouth daily.        Marland Kitchen atorvastatin (LIPITOR) 10 MG tablet Take 1 tablet (10 mg total) by mouth daily.  90 tablet  3  . benzonatate (TESSALON) 200 MG capsule Take 1 capsule (200 mg total) by mouth 3 (three) times daily as needed for cough.  30 capsule  0  . clopidogrel (PLAVIX) 75 MG tablet Take 1 tablet (75 mg total) by mouth daily.  30 tablet  11  . fluticasone (FLONASE) 50 MCG/ACT nasal spray 2 sprays by Nasal route as needed.        . furosemide (LASIX) 80 MG tablet  Take 80 mg by mouth daily.      Marland Kitchen glipiZIDE (GLUCOTROL XL) 10 MG 24 hr tablet Take 1 tablet (10 mg total) by mouth daily.  90 tablet  3  . glucose blood (BAYER CONTOUR TEST) test strip Use as instructed, check blood sugar three times daily       . hydrALAZINE (APRESOLINE) 50 MG tablet Take 1 tablet (50 mg total) by mouth 3 (three) times daily.  90 tablet  6  . insulin detemir (LEVEMIR FLEXPEN) 100 UNIT/ML injection Inject 25 Units into the skin daily.  9 mL  12  . Insulin Pen Needle (PEN NEEDLES 5/16") 30G X 8 MM MISC Inject 20 Units into the skin as directed. As directed.  100 each  6  . isosorbide mononitrate (IMDUR) 30 MG 24 hr tablet Take 1 tablet (30 mg total) by mouth 2 (two) times daily.  60 tablet  6  . metoprolol (LOPRESSOR) 100 MG tablet Take 1 tablet (100 mg total) by mouth 2 (two) times daily.  180 tablet  3  . Multiple Vitamin (MULTIVITAMIN) tablet Take 1 tablet by mouth daily.        . nitroGLYCERIN (NITROSTAT) 0.4 MG SL tablet Place 1 tablet (0.4 mg total) under the tongue every 5 (five) minutes as needed.  30 tablet  3  . omeprazole (PRILOSEC) 20 MG capsule Take 20 mg by mouth daily.        . potassium chloride SA (K-DUR,KLOR-CON) 20 MEQ tablet Take 20 mEq by mouth daily. If the furosemide is taken      . Tamsulosin HCl (FLOMAX) 0.4 MG CAPS Take 0.4 mg by mouth.        . warfarin (COUMADIN) 5 MG tablet TAKE 1+1/2 TABLETS DAILY OR AS DIRECTED  60 tablet  7  . cefUROXime (CEFTIN) 250 MG tablet Take 1 tablet (250 mg total) by mouth 2 (two) times daily.  20 tablet  0    Review of Systems:  As above. Prior he was feeling fairly well. No fever or chills. No knee or hip pain.  Physical Examination: Filed Vitals:   10/09/11 1132  BP: 200/90  Pulse: 67  Temp: 98.6 F (37 C)   Filed Vitals:   10/09/11 1132  Weight: 225 lb 8 oz (102.286 kg)   There is no height on file to calculate BMI. Ideal Body Weight:     GEN: WDWN, NAD, Non-toxic, Alert & Oriented x 3 HEENT:  Atraumatic,  Normocephalic.  Ears and Nose: No external deformity. EXTR: No clubbing/cyanosis/edema PSYCH: Normally interactive. Conversant. Not depressed or anxious appearing.  Calm demeanor.   Left foot is diffusely tender, tender at the true ankle joint, tender at the mid foot as well as in his MTP joints. There some swelling. No bruising. Range of motion is restricted.  Assessment and Plan:  1. Left foot pain  DG Foot Complete Left  2. Gout attack     Left foot acute pain without trauma, most consistent with gout versus pseudogout.  We will treat with colchicine and prednisone, as well as prn vicodin.  Dg Foot Complete Left  10/09/2011  *RADIOLOGY REPORT*  Clinical Data: Left foot pain  LEFT FOOT - COMPLETE 3+ VIEW  Comparison: None.  Findings: Four views of the left foot submitted.  No acute fracture or subluxation.  Spurring noted at the base of the fifth metatarsal.  Dorsal spurring of the anterior aspect of the talus. Vascular calcifications are noted.  IMPRESSION: No acute fracture or subluxation.  Mild degenerative changes.   Original Report Authenticated By: Natasha Mead, M.D.     Orders Today:  Orders Placed This Encounter  Procedures  . DG Foot Complete Left    Standing Status: Future     Number of Occurrences: 1     Standing Expiration Date: 12/08/2012    Order Specific Question:  Preferred imaging location?    Answer:  [facility]    Order Specific Question:  Reason for exam:    Answer:  left foot pain    Medications Today: (Includes new updates added during medication reconciliation) Meds ordered this encounter  Medications  . predniSONE (DELTASONE) 20 MG tablet    Sig: 2 tabs po daily x 2 days, then 1 tab for 2 days, then 1/2 tab for 2 days    Dispense:  7 tablet    Refill:  0  . colchicine 0.6 MG tablet    Sig: Take 1 tablet (0.6 mg total) by mouth 2 (two) times daily.    Dispense:  60 tablet    Refill:  2  . HYDROcodone-acetaminophen (VICODIN) 5-500 MG  per tablet    Sig: Take 1 tablet by mouth every 6 (six) hours as needed for pain.    Dispense:  20 tablet    Refill:  0    Medications Discontinued: There are no discontinued medications.   Hannah Beat, MD,

## 2011-10-09 NOTE — Patient Instructions (Addendum)
Gout Gout is an inflammatory condition (arthritis) caused by a buildup of uric acid crystals in the joints. Uric acid is a chemical that is normally present in the blood. Under some circumstances, uric acid can form into crystals in your joints. This causes joint redness, soreness, and swelling (inflammation). Repeat attacks are common. Over time, uric acid crystals can form into masses (tophi) near a joint, causing disfigurement. Gout is treatable and often preventable. CAUSES  The disease begins with elevated levels of uric acid in the blood. Uric acid is produced by your body when it breaks down a naturally found substance called purines. This also happens when you eat certain foods such as meats and fish. Causes of an elevated uric acid level include:  Being passed down from parent to child (heredity).   Diseases that cause increased uric acid production (obesity, psoriasis, some cancers).   Excessive alcohol use.   Diet, especially diets rich in meat and seafood.   Medicines, including certain cancer-fighting drugs (chemotherapy), diuretics, and aspirin.   Chronic kidney disease. The kidneys are no longer able to remove uric acid well.   Problems with metabolism.  Conditions strongly associated with gout include:  Obesity.   High blood pressure.   High cholesterol.   Diabetes.  Not everyone with elevated uric acid levels gets gout. It is not understood why some people get gout and others do not. Surgery, joint injury, and eating too much of certain foods are some of the factors that can lead to gout. SYMPTOMS   An attack of gout comes on quickly. It causes intense pain with redness, swelling, and warmth in a joint.   Fever can occur.   Often, only one joint is involved. Certain joints are more commonly involved:   Base of the big toe.   Knee.   Ankle.   Wrist.   Finger.  Without treatment, an attack usually goes away in a few days to weeks. Between attacks, you  usually will not have symptoms, which is different from many other forms of arthritis. DIAGNOSIS  Your caregiver will suspect gout based on your symptoms and exam. Removal of fluid from the joint (arthrocentesis) is done to check for uric acid crystals. Your caregiver will give you a medicine that numbs the area (local anesthetic) and use a needle to remove joint fluid for exam. Gout is confirmed when uric acid crystals are seen in joint fluid, using a special microscope. Sometimes, blood, urine, and X-ray tests are also used. TREATMENT  There are 2 phases to gout treatment: treating the sudden onset (acute) attack and preventing attacks (prophylaxis). Treatment of an Acute Attack  Medicines are used. These include anti-inflammatory medicines or steroid medicines.   An injection of steroid medicine into the affected joint is sometimes necessary.   The painful joint is rested. Movement can worsen the arthritis.   You may use warm or cold treatments on painful joints, depending which works best for you.   Discuss the use of coffee, vitamin C, or cherries with your caregiver. These may be helpful treatment options.  Treatment to Prevent Attacks After the acute attack subsides, your caregiver may advise prophylactic medicine. These medicines either help your kidneys eliminate uric acid from your body or decrease your uric acid production. You may need to stay on these medicines for a very long time. The early phase of treatment with prophylactic medicine can be associated with an increase in acute gout attacks. For this reason, during the first few months   of treatment, your caregiver may also advise you to take medicines usually used for acute gout treatment. Be sure you understand your caregiver's directions. You should also discuss dietary treatment with your caregiver. Certain foods such as meats and fish can increase uric acid levels. Other foods such as dairy can decrease levels. Your caregiver  can give you a list of foods to avoid. HOME CARE INSTRUCTIONS   Do not take aspirin to relieve pain. This raises uric acid levels.   Only take over-the-counter or prescription medicines for pain, discomfort, or fever as directed by your caregiver.   Rest the joint as much as possible. When in bed, keep sheets and blankets off painful areas.   Keep the affected joint raised (elevated).   Use crutches if the painful joint is in your leg.   Drink enough water and fluids to keep your urine clear or pale yellow. This helps your body get rid of uric acid. Do not drink alcoholic beverages. They slow the passage of uric acid.   Follow your caregiver's dietary instructions. Pay careful attention to the amount of protein you eat. Your daily diet should emphasize fruits, vegetables, whole grains, and fat-free or low-fat milk products.   Maintain a healthy body weight.  SEEK MEDICAL CARE IF:   You have an oral temperature above 102 F (38.9 C).   You develop diarrhea, vomiting, or any side effects from medicines.   You do not feel better in 24 hours, or you are getting worse.  SEEK IMMEDIATE MEDICAL CARE IF:   Your joint becomes suddenly more tender and you have:   Chills.   An oral temperature above 102 F (38.9 C), not controlled by medicine.  MAKE SURE YOU:   Understand these instructions.   Will watch your condition.   Will get help right away if you are not doing well or get worse.  Document Released: 01/14/2000 Document Revised: 01/05/2011 Document Reviewed: 04/26/2009 Exit[employer] Patient Information 2012 [employer]. 

## 2011-10-31 ENCOUNTER — Ambulatory Visit (INDEPENDENT_AMBULATORY_CARE_PROVIDER_SITE_OTHER): Payer: No Typology Code available for payment source | Admitting: Internal Medicine

## 2011-10-31 VITALS — BP 142/60 | HR 61 | Temp 98.1°F

## 2011-10-31 DIAGNOSIS — Z452 Encounter for adjustment and management of vascular access device: Secondary | ICD-10-CM

## 2011-10-31 DIAGNOSIS — I82409 Acute embolism and thrombosis of unspecified deep veins of unspecified lower extremity: Secondary | ICD-10-CM

## 2011-10-31 DIAGNOSIS — I4891 Unspecified atrial fibrillation: Secondary | ICD-10-CM

## 2011-10-31 DIAGNOSIS — Z7902 Long term (current) use of antithrombotics/antiplatelets: Secondary | ICD-10-CM

## 2011-10-31 LAB — POCT INR: INR: 2.9

## 2011-10-31 NOTE — Patient Instructions (Signed)
Continue current dose, check in 4 weeks  

## 2011-11-01 ENCOUNTER — Encounter: Payer: Self-pay | Admitting: Internal Medicine

## 2011-11-01 ENCOUNTER — Ambulatory Visit (INDEPENDENT_AMBULATORY_CARE_PROVIDER_SITE_OTHER): Payer: No Typology Code available for payment source | Admitting: Internal Medicine

## 2011-11-01 VITALS — BP 148/70 | HR 58 | Temp 97.7°F | Wt 224.0 lb

## 2011-11-01 DIAGNOSIS — Z23 Encounter for immunization: Secondary | ICD-10-CM

## 2011-11-01 DIAGNOSIS — R51 Headache: Secondary | ICD-10-CM

## 2011-11-01 NOTE — Assessment & Plan Note (Addendum)
Wide differential diagnosis Could be pressure from too tight glasses---though doesn't wear them all the time ?temporal arteritis ---doubtful since some right pain also. Will check sed rate No obvious dental issues TMJ? 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

## 2011-11-01 NOTE — Progress Notes (Signed)
Subjective:    Patient ID: [name], male    DOB: 1931/10/26, 78 y.o.   MRN: [id number]  HPI Here with wife Still having "major headache" Neck is better but head is still bad---tries to do ROM exercises with neck which helps Did do PT for the neck and it may have helped  Pain is severe in headache Has sense of "holding" in left temple Sharp pain Goes back 3 days  Slight vision change but no amaurosis fugax Slight jaw pain with chewing  Gout attack recently Bad left foot arthritis Got cane--Rx with colcrys and prednisone--done with this for some time Noted some headache then but seemed to go away Still taking the colcrys  Using 2 aspirin for headache--not helping Ran out of tylenol Current Outpatient Prescriptions on File Prior to Visit  Medication Sig Dispense Refill  . acetaminophen (TYLENOL) 500 MG tablet Take 500 mg by mouth every 6 (six) hours as needed.        Marland Kitchen amiodarone (PACERONE) 200 MG tablet Take 1 tablet (200 mg total) by mouth daily.  90 tablet  0  . aspirin 81 MG tablet Take 81 mg by mouth daily.        Marland Kitchen atorvastatin (LIPITOR) 10 MG tablet Take 1 tablet (10 mg total) by mouth daily.  90 tablet  3  . clopidogrel (PLAVIX) 75 MG tablet Take 1 tablet (75 mg total) by mouth daily.  30 tablet  11  . colchicine 0.6 MG tablet Take 1 tablet (0.6 mg total) by mouth 2 (two) times daily.  60 tablet  2  . fluticasone (FLONASE) 50 MCG/ACT nasal spray 2 sprays by Nasal route as needed.        . furosemide (LASIX) 80 MG tablet Take 80 mg by mouth daily.      Marland Kitchen glipiZIDE (GLUCOTROL XL) 10 MG 24 hr tablet Take 1 tablet (10 mg total) by mouth daily.  90 tablet  3  . glucose blood (BAYER CONTOUR TEST) test strip Use as instructed, check blood sugar three times daily       . hydrALAZINE (APRESOLINE) 50 MG tablet Take 1 tablet (50 mg total) by mouth 3 (three) times daily.  90 tablet  6  . insulin detemir (LEVEMIR FLEXPEN) 100 UNIT/ML injection Inject 25 Units into the skin  daily.  9 mL  12  . Insulin Pen Needle (PEN NEEDLES 5/16") 30G X 8 MM MISC Inject 20 Units into the skin as directed. As directed.  100 each  6  . isosorbide mononitrate (IMDUR) 30 MG 24 hr tablet Take 1 tablet (30 mg total) by mouth 2 (two) times daily.  60 tablet  6  . metoprolol (LOPRESSOR) 100 MG tablet Take 1 tablet (100 mg total) by mouth 2 (two) times daily.  180 tablet  3  . Multiple Vitamin (MULTIVITAMIN) tablet Take 1 tablet by mouth daily.        . nitroGLYCERIN (NITROSTAT) 0.4 MG SL tablet Place 1 tablet (0.4 mg total) under the tongue every 5 (five) minutes as needed.  30 tablet  3  . omeprazole (PRILOSEC) 20 MG capsule Take 20 mg by mouth daily.        . potassium chloride SA (K-DUR,KLOR-CON) 20 MEQ tablet Take 20 mEq by mouth daily. If the furosemide is taken      . Tamsulosin HCl (FLOMAX) 0.4 MG CAPS Take 0.4 mg by mouth.        . warfarin (COUMADIN) 5 MG tablet TAKE 1+1/2 TABLETS  DAILY OR AS DIRECTED  60 tablet  7    Allergies  Allergen Reactions  . Buspirone Hcl     REACTION: unknown  . Sulfonamide Derivatives     REACTION: unspecified    Past Medical History  Diagnosis Date  . Allergy   . Anemia   . GERD (gastroesophageal reflux disease)   . Hypertension   . Osteoarthritis   . Hyperlipidemia   . BPH (benign prostatic hyperplasia)     Dr. Achilles Dunk  . Diastolic CHF, acute   . S/P ablation of atrial fibrillation   . Anxiety   . 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 Diamond Nickel  . Allergic rhinitis   . CAD (coronary artery disease)     Dr. Diamond Nickel  . Diabetes mellitus type II   . Atrial flutter 07/1999  . Atrial tachycardia 03/2000  . Chest pain 03/2000    Past Surgical History  Procedure Date  . Angioplasty     multiple, and stents  . Coronary artery bypass graft 01/2000  . Circumcision 12/2005  . Coronary stent placement 07/2003    RCA  . Circumcision 12/2005    Family History  Problem Relation Age of Onset  . Diabetes Mother   . Kidney disease  Mother     ESRD  . Heart disease Father     CVA  . Stroke Father 39    deceased from same  . Hypertension Sister   . Coronary artery disease Sister     History   Social History  . Marital Status: Married    Spouse Name: N/A    Number of Children: 4  . Years of Education: N/A   Occupational History  . retired     disabled due to CAD   Social History Main Topics  . Smoking status: Never Smoker   . Smokeless tobacco: Former Neurosurgeon    Types: Chew    Quit date: 01/30/1985  . Alcohol Use: No     Quit 30 years ago  . Drug Use: No  . Sexually Active: Not on file   Other Topics Concern  . Not on file   Social History Narrative   Married with 4 children      Review of Systems No fever No sweats but slight chills Some chest pain this AM    Objective:   Physical Exam  HENT:  Mouth/Throat: Oropharynx is clear and moist. No oropharyngeal exudate.       TMs fine No temporal bruits ?slight TMJ tenderness Glasses making indentations in temples bilaterally  Neck: Normal range of motion.  Lymphadenopathy:    He has no cervical adenopathy.  Neurological: He has normal strength. No cranial nerve deficit. He exhibits normal muscle tone. He displays a negative Romberg sign. Coordination and gait normal.       Mild peripheral CN VII asymmetry on left          Assessment & Plan:

## 2011-11-01 NOTE — Addendum Note (Signed)
Addended by: Sueanne Margarita on: 11/01/2011 11:11 AM   Modules accepted: Orders

## 2011-11-01 NOTE — Patient Instructions (Signed)
Please try larger glasses. Call if the headache is not better by next week--I will refer you to a neurologist

## 2011-11-02 ENCOUNTER — Encounter: Payer: Self-pay | Admitting: *Deleted

## 2011-11-07 ENCOUNTER — Other Ambulatory Visit: Payer: Self-pay | Admitting: *Deleted

## 2011-11-07 ENCOUNTER — Ambulatory Visit: Payer: No Typology Code available for payment source | Admitting: Internal Medicine

## 2011-11-07 MED ORDER — AMIODARONE HCL 200 MG PO TABS: 200.0000 mg | ORAL_TABLET | Freq: Every day | ORAL | Status: DC

## 2011-11-07 MED ORDER — POTASSIUM CHLORIDE CRYS ER 20 MEQ PO TBCR: 20.0000 meq | EXTENDED_RELEASE_TABLET | Freq: Every day | ORAL | Status: DC

## 2011-11-07 NOTE — Telephone Encounter (Signed)
Refilled Potassium and Amiodarone.

## 2011-11-17 DIAGNOSIS — R079 Chest pain, unspecified: Secondary | ICD-10-CM

## 2011-11-28 ENCOUNTER — Other Ambulatory Visit: Payer: Self-pay | Admitting: *Deleted

## 2011-11-28 ENCOUNTER — Ambulatory Visit: Payer: No Typology Code available for payment source

## 2011-11-28 MED ORDER — WARFARIN SODIUM 5 MG PO TABS: ORAL_TABLET | ORAL | Status: DC

## 2011-11-30 ENCOUNTER — Telehealth: Payer: Self-pay

## 2011-11-30 NOTE — Telephone Encounter (Signed)
Pt's wife said for 2 days pt has had severe rt sided h/a; did not sleep at all last night due to h/a, neck pain, nausea,dizzy,slight SOB,productive cough white phlegm on and off, chest pain on and off, no chest pain today. No weakness in arms or legs.pain level now is 8-9. Pt had glasses changed with no improvement of symptoms since saw Dr Alphonsus Sias 11/01/11. Dr Alphonsus Sias had noted in 11/01/11 note if pain persist will set up with neurology; Shirlee Limerick called [facility] Dr Clelia Croft could see pt today at 2:00pm. Shirlee Limerick said she will call Valeria with details of appt. Valeria voiced understanding.

## 2011-11-30 NOTE — Telephone Encounter (Signed)
Thanks

## 2011-12-04 ENCOUNTER — Ambulatory Visit: Payer: No Typology Code available for payment source

## 2011-12-06 ENCOUNTER — Other Ambulatory Visit: Payer: Self-pay | Admitting: *Deleted

## 2011-12-06 MED ORDER — METOPROLOL TARTRATE 100 MG PO TABS: 100.0000 mg | ORAL_TABLET | Freq: Two times a day (BID) | ORAL | Status: DC

## 2011-12-08 ENCOUNTER — Telehealth: Payer: Self-pay | Admitting: Internal Medicine

## 2011-12-08 DIAGNOSIS — R55 Syncope and collapse: Secondary | ICD-10-CM

## 2011-12-08 NOTE — Telephone Encounter (Signed)
Patient's wife called to let you know that patient is @ [facility] Room 109.  Patient passed out and fell face first on to the floor.  Patient is taking Morphine for his back and the right side of his face is swollen.  The History and Physical from [facility] is in your In Box.  Patient's wife said she'll be with him all day.

## 2011-12-08 NOTE — Telephone Encounter (Signed)
Spoke to wife Staying overnight---getting neurologist and nephrologist involved but hopefully home soon

## 2011-12-11 DIAGNOSIS — I4891 Unspecified atrial fibrillation: Secondary | ICD-10-CM

## 2011-12-13 ENCOUNTER — Telehealth: Payer: Self-pay | Admitting: Internal Medicine

## 2011-12-13 NOTE — Telephone Encounter (Signed)
Caller: [name]/Other; Patient Name: [name]; PCP: Tillman Abide [facility]); Best Callback Phone Number: [phone].  [name] from [facility] needing clarification order for physical therapy.  Physical therapy would like to have orders for PT for 2 times a week for 1 week; 3 times a week for 2 weeks; 2 times a week for 2 weeks.  Also skilled nursing evaluation due to 2 open wounds noted on the buttocks; also OT eval due to no grab bars in the bathroom; and family request Social Worker consult and Home Health Aide.PLEASE F/U WITH HOME HEALTH NURSE LAURA, ABOVE LISTED NUMBER.  THANK YOU.

## 2011-12-13 NOTE — Telephone Encounter (Signed)
Okay to proceed as requested 

## 2011-12-13 NOTE — Telephone Encounter (Signed)
Spoke with nurse and advised results  

## 2011-12-14 ENCOUNTER — Telehealth: Payer: Self-pay | Admitting: Internal Medicine

## 2011-12-14 ENCOUNTER — Telehealth: Payer: Self-pay

## 2011-12-14 ENCOUNTER — Ambulatory Visit: Payer: No Typology Code available for payment source

## 2011-12-14 MED ORDER — TRAZODONE HCL 50 MG PO TABS: 50.0000 mg | ORAL_TABLET | Freq: Every day | ORAL | Status: DC

## 2011-12-14 NOTE — Telephone Encounter (Signed)
Spoke with patient's wife and advised results rx sent to pharmacy by e-script  

## 2011-12-14 NOTE — Telephone Encounter (Signed)
Okay to try trazodone 50mg  1/2 at bedtime for 2 nights, then increase to full tab We can discuss at his visit next week  Rx 50mg   #60 x 1 Take at bedtime as directed (up to 2 per night) Let her know we will increase it next week if need be

## 2011-12-14 NOTE — Telephone Encounter (Signed)
[name] OT [facility] left v/m request verbal order for OT 2 x a week for 2 weeks also request Home health aide assist with  shower 2 x a week for 3 weeks. Please advise.

## 2011-12-14 NOTE — Telephone Encounter (Signed)
pts wife said pt has f/u appt with Dr Alphonsus Sias after discharge from [facility] for recent fall,seizures and low heart rate on 12/20/11. pts wife said pt in early stages of dementia; pt goes to sleep around midnight then wakes up at 2 am and is awake remainder of night talking and walking thru house; pts wife request med to help pt sleep.[facility] [place].

## 2011-12-15 NOTE — Telephone Encounter (Signed)
That is okay.

## 2011-12-15 NOTE — Telephone Encounter (Signed)
Spoke with nurse and advised results  

## 2011-12-18 ENCOUNTER — Telehealth: Payer: Self-pay

## 2011-12-18 NOTE — Telephone Encounter (Signed)
Ronald Lobo Child psychotherapist with [facility] saw pt today; will help pt with long term care planning for personal care services and medication assistance after [facility] care is complete.Ronald Lobo will be available to family by phone also.Jeanese wanted Dr Alphonsus Sias to have this info.

## 2011-12-18 NOTE — Telephone Encounter (Signed)
Noted Good to hear they have social work help at this point

## 2011-12-19 ENCOUNTER — Telehealth: Payer: Self-pay | Admitting: Internal Medicine

## 2011-12-19 NOTE — Telephone Encounter (Signed)
Caller: Marcelino Duster, Rn/; Phone: [phone]; Reason for Call: Needs orders and also needs to report wound evaluation.

## 2011-12-19 NOTE — Telephone Encounter (Signed)
Patient coming in for OV 12/20/11, please advise

## 2011-12-20 ENCOUNTER — Ambulatory Visit (INDEPENDENT_AMBULATORY_CARE_PROVIDER_SITE_OTHER): Payer: No Typology Code available for payment source | Admitting: Internal Medicine

## 2011-12-20 ENCOUNTER — Encounter: Payer: Self-pay | Admitting: Internal Medicine

## 2011-12-20 VITALS — BP 140/60 | HR 78 | Temp 97.9°F | Wt 222.0 lb

## 2011-12-20 DIAGNOSIS — Z7902 Long term (current) use of antithrombotics/antiplatelets: Secondary | ICD-10-CM

## 2011-12-20 DIAGNOSIS — I4891 Unspecified atrial fibrillation: Secondary | ICD-10-CM

## 2011-12-20 DIAGNOSIS — L89309 Pressure ulcer of unspecified buttock, unspecified stage: Secondary | ICD-10-CM

## 2011-12-20 DIAGNOSIS — F0150 Vascular dementia without behavioral disturbance: Secondary | ICD-10-CM

## 2011-12-20 DIAGNOSIS — L8992 Pressure ulcer of unspecified site, stage 2: Secondary | ICD-10-CM

## 2011-12-20 DIAGNOSIS — E1129 Type 2 diabetes mellitus with other diabetic kidney complication: Secondary | ICD-10-CM

## 2011-12-20 DIAGNOSIS — R55 Syncope and collapse: Secondary | ICD-10-CM

## 2011-12-20 DIAGNOSIS — Z452 Encounter for adjustment and management of vascular access device: Secondary | ICD-10-CM

## 2011-12-20 DIAGNOSIS — I82409 Acute embolism and thrombosis of unspecified deep veins of unspecified lower extremity: Secondary | ICD-10-CM

## 2011-12-20 DIAGNOSIS — E1165 Type 2 diabetes mellitus with hyperglycemia: Secondary | ICD-10-CM

## 2011-12-20 DIAGNOSIS — L89302 Pressure ulcer of unspecified buttock, stage 2: Secondary | ICD-10-CM

## 2011-12-20 LAB — POCT INR: INR: 2.4

## 2011-12-20 NOTE — Assessment & Plan Note (Signed)
Almost healed Just needs neosporin and dressing (gauze) till healed

## 2011-12-20 NOTE — Telephone Encounter (Signed)
2 small ulcers are almost healed He needs neosporin to them daily and cover with gauze till fully granulated Wife should be able to do this after instructed

## 2011-12-20 NOTE — Telephone Encounter (Signed)
Spoke with nurse and advised results  

## 2011-12-20 NOTE — Patient Instructions (Signed)
Continue 5 mg daily except  7.5 mg MWF, recheck 4 weeks

## 2011-12-20 NOTE — Assessment & Plan Note (Signed)
Memory problems go back some time but now worse Discussed variable prognosis in these cases

## 2011-12-20 NOTE — Telephone Encounter (Signed)
NO duoderm---it didn't stay on and may cause maceration

## 2011-12-20 NOTE — Progress Notes (Signed)
Subjective:    Patient ID: [name], male    DOB: April 15, 1931, 78 y.o.   MRN: [id number]  HPI Here with wife and daughter  Admitted for syncopal attack ?vaso vagal Amiodarone stopped for a while but back on upon discharge  Mild dizziness only now No near syncope Needs the cane for balance  Some abdominal upset Appetite is still great  Memory did worsen He thinks it is better but family disagrees Wife is the driver and handles all the finances Independent with ADLs except help with ADLs Had delerium after leaving the hospital---seems to have settled down Hadn't been sleeping---now doing well on 2 trazodone a night  Has checked sugars twice since coming home 155 two days ago  Small sacral decubitus Now clearing up  Some cough with mucus Only occ SOB  Current Outpatient Prescriptions on File Prior to Visit  Medication Sig Dispense Refill  . amiodarone (PACERONE) 200 MG tablet Take 1 tablet (200 mg total) by mouth daily.  60 tablet  2  . amLODipine (NORVASC) 5 MG tablet Take 5 mg by mouth daily.      Marland Kitchen atorvastatin (LIPITOR) 10 MG tablet Take 1 tablet (10 mg total) by mouth daily.  90 tablet  3  . carbamazepine (TEGRETOL) 200 MG tablet Take 200 mg by mouth 2 (two) times daily.      . clopidogrel (PLAVIX) 75 MG tablet Take 1 tablet (75 mg total) by mouth daily.  30 tablet  11  . colchicine 0.6 MG tablet Take 1 tablet (0.6 mg total) by mouth 2 (two) times daily.  60 tablet  2  . fluticasone (FLONASE) 50 MCG/ACT nasal spray 2 sprays by Nasal route as needed.        . furosemide (LASIX) 80 MG tablet Take 80 mg by mouth daily.      Marland Kitchen glipiZIDE (GLUCOTROL XL) 10 MG 24 hr tablet Take 1 tablet (10 mg total) by mouth daily.  90 tablet  3  . glucose blood (BAYER CONTOUR TEST) test strip Use as instructed, check blood sugar three times daily       . hydrALAZINE (APRESOLINE) 50 MG tablet Take 1 tablet (50 mg total) by mouth 3 (three) times daily.  90 tablet  6  . insulin detemir  (LEVEMIR FLEXPEN) 100 UNIT/ML injection Inject 25 Units into the skin daily.  9 mL  12  . Insulin Pen Needle (PEN NEEDLES 5/16") 30G X 8 MM MISC Inject 20 Units into the skin as directed. As directed.  100 each  6  . isosorbide mononitrate (IMDUR) 30 MG 24 hr tablet Take 1 tablet (30 mg total) by mouth 2 (two) times daily.  60 tablet  6  . Multiple Vitamin (MULTIVITAMIN) tablet Take 1 tablet by mouth daily.        . nitroGLYCERIN (NITROSTAT) 0.4 MG SL tablet Place 1 tablet (0.4 mg total) under the tongue every 5 (five) minutes as needed.  30 tablet  3  . omeprazole (PRILOSEC) 20 MG capsule Take 20 mg by mouth daily.        . potassium chloride SA (K-DUR,KLOR-CON) 20 MEQ tablet Take 1 tablet (20 mEq total) by mouth daily. If the furosemide is taken  30 tablet  3  . Tamsulosin HCl (FLOMAX) 0.4 MG CAPS Take 0.4 mg by mouth.        . traZODone (DESYREL) 50 MG tablet Take 1-2 tablets (50-100 mg total) by mouth at bedtime.  60 tablet  1  . warfarin (  COUMADIN) 5 MG tablet TAKE 1+1/2 TABLETS DAILY OR AS DIRECTED  60 tablet  3    Allergies  Allergen Reactions  . Buspirone Hcl     REACTION: unknown  . Sulfonamide Derivatives     REACTION: unspecified    Past Medical History  Diagnosis Date  . Allergy   . Anemia   . GERD (gastroesophageal reflux disease)   . Hypertension   . Osteoarthritis   . Hyperlipidemia   . BPH (benign prostatic hyperplasia)     Dr. Achilles Dunk  . Diastolic CHF, acute   . S/P ablation of atrial fibrillation   . Anxiety   . 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 Diamond Nickel  . Allergic rhinitis   . CAD (coronary artery disease)     Dr. Diamond Nickel  . Diabetes mellitus type II   . Atrial flutter 07/1999  . Atrial tachycardia 03/2000  . Chest pain 03/2000  . Vascular dementia     Past Surgical History  Procedure Date  . Angioplasty     multiple, and stents  . Coronary artery bypass graft 01/2000  . Circumcision 12/2005  . Coronary stent placement 07/2003    RCA  .  Circumcision 12/2005    Family History  Problem Relation Age of Onset  . Diabetes Mother   . Kidney disease Mother     ESRD  . Heart disease Father     CVA  . Stroke Father 80    deceased from same  . Hypertension Sister   . Coronary artery disease Sister     History   Social History  . Marital Status: Married    Spouse Name: N/A    Number of Children: 4  . Years of Education: N/A   Occupational History  . retired     disabled due to CAD   Social History Main Topics  . Smoking status: Never Smoker   . Smokeless tobacco: Former Neurosurgeon    Types: Chew    Quit date: 01/30/1985  . Alcohol Use: No     Comment: Quit 30 years ago  . Drug Use: No  . Sexually Active: Not on file   Other Topics Concern  . Not on file   Social History Narrative   Married with 4 children   Review of Systems Ongoing back and side pain---relates to his fall. Taking tylenol as needed Weight fairly stable--down 2# since 6 weeks ago    Objective:   Physical Exam  Constitutional: He appears well-developed and well-nourished. No distress.  Neck: Normal range of motion. Neck supple.  Cardiovascular: Normal rate, regular rhythm and normal heart sounds.  Exam reveals no gallop.   No murmur heard. Pulmonary/Chest: Effort normal and breath sounds normal. No respiratory distress. He has no wheezes. He has no rales.  Lymphadenopathy:    He has no cervical adenopathy.  Skin:       2 small stage 2 ulcers on left buttocks          Assessment & Plan:

## 2011-12-20 NOTE — Telephone Encounter (Signed)
Find out what "orders" she needs before his appt

## 2011-12-20 NOTE — Telephone Encounter (Signed)
Probably not Would be good to make one more visit in a couple of days to be sure it is granulated and that wife is handling it okay

## 2011-12-20 NOTE — Telephone Encounter (Signed)
Spoke with nurse and advised results, just to be clear, since the wound is almost healed she wouldn't need to continue to see the patient?

## 2011-12-20 NOTE — Assessment & Plan Note (Signed)
Seems regular on amiodarone Check protime again

## 2011-12-20 NOTE — Telephone Encounter (Signed)
Need orders for home health, skilled nurse visit, and DuoDerm to left buttock stage 2 for 2 times a week for 3 weeks, per nurse had temp yesterday 99.5, crackles and thick yellow sputum.

## 2011-12-20 NOTE — Assessment & Plan Note (Signed)
Probably multifactorial Discussed safety with slow position changes, using cane, etc

## 2011-12-20 NOTE — Assessment & Plan Note (Signed)
Seems to be okay Want reasonable control without any hypoglycemia

## 2011-12-21 ENCOUNTER — Telehealth: Payer: Self-pay

## 2011-12-21 NOTE — Telephone Encounter (Signed)
Jan PT with [facility] said arrived at scheduled time but OT was seeing pt and then pt has doctors appt. So Jan wanted to report a missed PT visit for pt.

## 2011-12-21 NOTE — Telephone Encounter (Signed)
noted 

## 2011-12-22 DIAGNOSIS — E119 Type 2 diabetes mellitus without complications: Secondary | ICD-10-CM

## 2011-12-22 DIAGNOSIS — I739 Peripheral vascular disease, unspecified: Secondary | ICD-10-CM

## 2011-12-22 DIAGNOSIS — R269 Unspecified abnormalities of gait and mobility: Secondary | ICD-10-CM

## 2011-12-22 DIAGNOSIS — I498 Other specified cardiac arrhythmias: Secondary | ICD-10-CM

## 2011-12-22 DIAGNOSIS — I4891 Unspecified atrial fibrillation: Secondary | ICD-10-CM

## 2011-12-25 ENCOUNTER — Telehealth: Payer: Self-pay

## 2011-12-25 NOTE — Telephone Encounter (Signed)
Jeff with [facility]; pt declined PT visit over weekend. No call back needed for Dr Letvak's info only.   

## 2011-12-25 NOTE — Telephone Encounter (Signed)
Noted  

## 2011-12-29 ENCOUNTER — Telehealth: Payer: Self-pay | Admitting: *Deleted

## 2011-12-29 NOTE — Telephone Encounter (Signed)
Trey Paula with [facility]; pt declined PT visit over weekend. No call back needed for Dr Karle Starch info only.

## 2011-12-30 NOTE — Telephone Encounter (Signed)
Noted  

## 2012-01-01 ENCOUNTER — Telehealth: Payer: Self-pay | Admitting: Internal Medicine

## 2012-01-01 ENCOUNTER — Ambulatory Visit (INDEPENDENT_AMBULATORY_CARE_PROVIDER_SITE_OTHER): Payer: No Typology Code available for payment source | Admitting: Family Medicine

## 2012-01-01 ENCOUNTER — Encounter: Payer: Self-pay | Admitting: Family Medicine

## 2012-01-01 VITALS — BP 130/60 | HR 84 | Temp 97.9°F | Ht 67.0 in | Wt 224.2 lb

## 2012-01-01 DIAGNOSIS — R55 Syncope and collapse: Secondary | ICD-10-CM

## 2012-01-01 DIAGNOSIS — E119 Type 2 diabetes mellitus without complications: Secondary | ICD-10-CM

## 2012-01-01 DIAGNOSIS — F0150 Vascular dementia without behavioral disturbance: Secondary | ICD-10-CM

## 2012-01-01 MED ORDER — INSULIN DETEMIR 100 UNIT/ML ~~LOC~~ SOLN: 12.0000 [IU] | Freq: Every day | SUBCUTANEOUS | Status: AC

## 2012-01-01 NOTE — Telephone Encounter (Signed)
Patient Information:  Caller Name: Vikki Ports  Phone: [phone]  Patient: [name], [name]  Gender: Male  DOB: 1931/02/17  Age: 78 Years  PCP: Tillman Abide [facility])   Symptoms  Reason For Call & Symptoms: pt fell on 12/30/11 in the bathroom; pt fell on his bottom.  Caller also reports BS has been 80s.  Pt BP this am is 138/109/ Pulse was 131.  Caller states pt is reports weakness and intermittent headache.  Pt was recently hosptialized 11/7 - 11/11 (for fall)  Reviewed Health History In EMR: Yes  Reviewed Medications In EMR: Yes  Reviewed Allergies In EMR: Yes  Reviewed Surgeries / Procedures: Yes  Date of Onset of Symptoms: 12/11/2011  Treatments Tried: caller is holding insulin  Treatments Tried Worked: No  Guideline(s) Used:  High Blood Pressure  Disposition Per Guideline:   Go to ED Now (or to Office with PCP Approval)  Reason For Disposition Reached:   BP > 160 / 100 and any cardiac or neurologic symptoms (e.g., chest pain, difficulty breathing, unsteady gait, blurred vision)  Advice Given:  Call Back If:  Chest pain or difficulty breathing occurs  You become worse.  Office Follow Up:  Does the office need to follow up with this patient?: No  Instructions For The Office: N/A  Appointment Scheduled:  01/01/2012 12:30:00  RN Note:  pt also reports arms feel like they are going numb sometimes; but denies chest pain.  Pt has been having symptoms since 12/11/11.  Appt scheduled with Dr Dallas Schimke (first available appt) no appt found for Dr Alphonsus Sias

## 2012-01-01 NOTE — Progress Notes (Signed)
Nature conservation officer at [facility] [address] [place] [address] Phone: [phone] Fax: [phone]  Date:  01/01/2012   Name:  Finch Costanzo   DOB:  02/10/1931   MRN:  [id number] Gender: male Age: 78 y.o.  PCP:  Tillman Abide, MD  Evaluating MD: Hannah Beat, MD   Chief Complaint: Hypertension and Hypoglycemia   History of Present Illness:  Shant Hence is a 78 y.o. pleasant patient who presents with the following:  Pleasant male with multiple problems, DM on insulin, CHF, CAD s/p CABG, who was admitted to [facility] 12/07/2011 after fall, then had another fall this Saturday. Some bruising only. Walking OK with walker, but remains unsteady.  He does have some memory changes, neurological changes, such as generalized weakness, some slurring of speech per family that began a month or so ago. CT at [facility] at last hosp did not show acute ICH, EEG was also neg.  Fell on Saturday night. Niece helped  Had a fall on bottom, healing.   Also 12/07/2011  Walking and weak Yesterday, was sitting on the bed, was leaning back, and eyes were differrent and went back out. Speech is getting slurred.   Patient Active Problem List  Diagnosis  . TUBULOVILLOUS ADENOMA, COLON  . DIABETES MELLITUS, TYPE II  . HYPERCHOLESTEROLEMIA  . ANXIETY  . HYPERTENSION  . CORONARY ATHEROSCLEROSIS, ARTERY BYPASS GRAFT  . ATRIAL FIBRILLATION  . DIASTOLIC HEART FAILURE, CHRONIC  . ATHEROSCLEROSIS W /INT CLAUDICATION  . PVD WITH CLAUDICATION  . D V T  . ALLERGIC RHINITIS  . GERD  . DIVERTICULOSIS OF COLON  . RENAL INSUFFICIENCY  . BENIGN PROSTATIC HYPERTROPHY  . LOC OSTEOARTHROS NOT SPEC PRIM/SEC LOWER LEG  . OSTEOARTHRITIS  . ARTHRITIS, RIGHT SHOULDER  . DEGENERATIVE JOINT DISEASE, LUMBAR SPINE  . Type II or unspecified type diabetes mellitus with renal manifestations, uncontrolled(250.42)  . Elevated liver enzymes  . Edema  . Cervical pain (neck)  . Temporal headache  . Vascular  dementia  . Syncope  . Decubitus ulcer of buttock, stage 2    Past Medical History  Diagnosis Date  . Allergy   . Anemia   . GERD (gastroesophageal reflux disease)   . Hypertension   . Osteoarthritis   . Hyperlipidemia   . BPH (benign prostatic hyperplasia)     Dr. Achilles Dunk  . Diastolic CHF, acute   . S/P ablation of atrial fibrillation   . Anxiety   . 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 Diamond Nickel  . Allergic rhinitis   . CAD (coronary artery disease)     Dr. Diamond Nickel  . Diabetes mellitus type II   . Atrial flutter 07/1999  . Atrial tachycardia 03/2000  . Chest pain 03/2000  . Vascular dementia     Past Surgical History  Procedure Date  . Angioplasty     multiple, and stents  . Coronary artery bypass graft 01/2000  . Circumcision 12/2005  . Coronary stent placement 07/2003    RCA  . Circumcision 12/2005    History  Substance Use Topics  . Smoking status: Never Smoker   . Smokeless tobacco: Former Neurosurgeon    Types: Chew    Quit date: 01/30/1985  . Alcohol Use: No     Comment: Quit 30 years ago    Family History  Problem Relation Age of Onset  . Diabetes Mother   . Kidney disease Mother     ESRD  . Heart disease Father  CVA  . Stroke Father 46    deceased from same  . Hypertension Sister   . Coronary artery disease Sister     Allergies  Allergen Reactions  . Buspirone Hcl     REACTION: unknown  . Sulfonamide Derivatives     REACTION: unspecified    Medication list has been reviewed and updated.  Outpatient Prescriptions Prior to Visit  Medication Sig Dispense Refill  . amiodarone (PACERONE) 200 MG tablet Take 1 tablet (200 mg total) by mouth daily.  60 tablet  2  . amLODipine (NORVASC) 5 MG tablet Take 5 mg by mouth daily.      Marland Kitchen atorvastatin (LIPITOR) 10 MG tablet Take 1 tablet (10 mg total) by mouth daily.  90 tablet  3  . carbamazepine (TEGRETOL) 200 MG tablet Take 200 mg by mouth 2 (two) times daily.      . clopidogrel (PLAVIX) 75 MG  tablet Take 1 tablet (75 mg total) by mouth daily.  30 tablet  11  . colchicine 0.6 MG tablet Take 1 tablet (0.6 mg total) by mouth 2 (two) times daily.  60 tablet  2  . fluticasone (FLONASE) 50 MCG/ACT nasal spray 2 sprays by Nasal route as needed.        . furosemide (LASIX) 80 MG tablet Take 80 mg by mouth daily.      Marland Kitchen glipiZIDE (GLUCOTROL XL) 10 MG 24 hr tablet Take 1 tablet (10 mg total) by mouth daily.  90 tablet  3  . glucose blood (BAYER CONTOUR TEST) test strip Use as instructed, check blood sugar three times daily       . hydrALAZINE (APRESOLINE) 50 MG tablet Take 1 tablet (50 mg total) by mouth 3 (three) times daily.  90 tablet  6  . insulin detemir (LEVEMIR FLEXPEN) 100 UNIT/ML injection Inject 25 Units into the skin daily.  9 mL  12  . Insulin Pen Needle (PEN NEEDLES 5/16") 30G X 8 MM MISC Inject 20 Units into the skin as directed. As directed.  100 each  6  . isosorbide mononitrate (IMDUR) 30 MG 24 hr tablet Take 1 tablet (30 mg total) by mouth 2 (two) times daily.  60 tablet  6  . Multiple Vitamin (MULTIVITAMIN) tablet Take 1 tablet by mouth daily.        . nitroGLYCERIN (NITROSTAT) 0.4 MG SL tablet Place 1 tablet (0.4 mg total) under the tongue every 5 (five) minutes as needed.  30 tablet  3  . omeprazole (PRILOSEC) 20 MG capsule Take 20 mg by mouth daily.        . potassium chloride SA (K-DUR,KLOR-CON) 20 MEQ tablet Take 1 tablet (20 mEq total) by mouth daily. If the furosemide is taken  30 tablet  3  . Tamsulosin HCl (FLOMAX) 0.4 MG CAPS Take 0.4 mg by mouth.        . traZODone (DESYREL) 50 MG tablet Take 1-2 tablets (50-100 mg total) by mouth at bedtime.  60 tablet  1  . warfarin (COUMADIN) 5 MG tablet TAKE 1+1/2 TABLETS DAILY OR AS DIRECTED  60 tablet  3   Last reviewed on 12/20/2011 12:29 PM by Karie Schwalbe, MD  Review of Systems:  As above, no chest pain, no fever, no chills.  Physical Examination: Filed Vitals:   01/01/12 1225 01/01/12 1354  BP: 140/60 130/60    Pulse: 84   Temp: 97.9 F (36.6 C)   TempSrc: Oral   Height: 5\' 7"  (1.702 m)  Weight: 224 lb 4 oz (101.719 kg)   SpO2: 95%      Body mass index is 35.12 kg/(m^2). Ideal Body Weight: Weight in (lb) to have BMI = 25: 159.3    GEN: WDWN, NAD, Non-toxic, A & O x 3 HEENT: Atraumatic, Normocephalic. Neck supple. No masses, No LAD. Ears and Nose: No external deformity. CV: RRR, No M/G/R PULM: CTA B, no wheezes, crackles, rhonchi. No retractions. No resp. distress. No accessory muscle use. ABD: S, NT, ND, +BS. No rebound tenderness. No HSM.  EXTR: No c/c/e  Neuro: CN 2-12 grossly intact - smile on face mildly slow in following command. PERRLA. EOMI. Sensation intact throughout. Str 5/5 all extremities. DTR 2+. No clonus. A and o x 4. Romberg neg  PSYCH: Normally interactive. Conversant. Not depressed or anxious appearing.  Calm demeanor.     Assessment and Plan: 1. Syncope   2. Vascular dementia   3. DIABETES MELLITUS, TYPE II     Another fall, appears to be essentially neuro intact on exam, but globally decreased memory compared to my prior evaluations, appears more frail. PT on board, has also seen [facility].  HTN: stable on my recheck  DM: sugars have been running 80's-120 fasting, no post-prandials. Decrease insulin to 12 units levemir before breakfast, take fasting sugar and 2 hours post-prandial. Bring in log to f/u in 1 mo with Dr. Alphonsus Sias. Over the past few days, they have been holding insulin, so wanted more conservative dosing to see response and insure no low BS.  Hannah Beat, MD

## 2012-01-01 NOTE — Telephone Encounter (Signed)
noted 

## 2012-01-03 ENCOUNTER — Telehealth: Payer: Self-pay | Admitting: Internal Medicine

## 2012-01-03 NOTE — Telephone Encounter (Signed)
I will call to check on him

## 2012-01-03 NOTE — Telephone Encounter (Signed)
Patient's wife called to let you know patient is in [facility]  Room 231.  Patient's insulin level is low.  The liver enzymes and heart enzymes were high.

## 2012-01-04 DIAGNOSIS — I4891 Unspecified atrial fibrillation: Secondary | ICD-10-CM

## 2012-01-10 ENCOUNTER — Other Ambulatory Visit: Payer: Self-pay

## 2012-01-10 NOTE — Telephone Encounter (Signed)
[name]'s pharmacy request refill Amlodipine 5 mg taking two tablets by mouth once daily that was started while pt was in hospital at [facility]. Last filled 12/11/11. Not on pts med list.Please advise.

## 2012-01-10 NOTE — Telephone Encounter (Signed)
Please check with wife to confirm he is taking this If he is, okay to refill for 1 month till I review at his next appt

## 2012-01-11 MED ORDER — AMLODIPINE BESYLATE 5 MG PO TABS: 10.0000 mg | ORAL_TABLET | Freq: Every day | ORAL | Status: DC

## 2012-01-11 NOTE — Telephone Encounter (Signed)
Spoke with patient's wife and advised results, pt is taking bid. rx sent to pharmacy by e-script

## 2012-01-12 ENCOUNTER — Telehealth: Payer: Self-pay

## 2012-01-12 NOTE — Telephone Encounter (Signed)
That is fine 

## 2012-01-12 NOTE — Telephone Encounter (Signed)
Spoke with Jan and advised results

## 2012-01-12 NOTE — Telephone Encounter (Signed)
Jan PT with [facility] request verbal order for PT one week x 1, 3 week x 1, and 2 week x 3 for work on strengthening, balance and mobility. Please advise.

## 2012-01-17 ENCOUNTER — Encounter: Payer: Self-pay | Admitting: Internal Medicine

## 2012-01-17 ENCOUNTER — Telehealth: Payer: Self-pay

## 2012-01-17 NOTE — Telephone Encounter (Signed)
Jan PT with [facility] left v/m reporting pt fell 01/16/12; pt stumbled on his feet;no apparent injury. Can call Jan if needed and if need further info call pts wife [phone].Please advise.

## 2012-01-17 NOTE — Telephone Encounter (Signed)
noted 

## 2012-01-18 ENCOUNTER — Ambulatory Visit (INDEPENDENT_AMBULATORY_CARE_PROVIDER_SITE_OTHER): Payer: No Typology Code available for payment source | Admitting: General Practice

## 2012-01-18 DIAGNOSIS — I4891 Unspecified atrial fibrillation: Secondary | ICD-10-CM

## 2012-01-18 LAB — POCT INR: INR: 3.4

## 2012-01-18 NOTE — Patient Instructions (Addendum)
Take 5 mg tablet today and then change dosage to 1 tablet every day except take 1 1/2 on Monday and Thursdays.  Re-check in 4 weeks.

## 2012-01-22 ENCOUNTER — Encounter: Payer: Self-pay | Admitting: Internal Medicine

## 2012-01-22 ENCOUNTER — Ambulatory Visit (INDEPENDENT_AMBULATORY_CARE_PROVIDER_SITE_OTHER): Payer: No Typology Code available for payment source | Admitting: Internal Medicine

## 2012-01-22 VITALS — BP 140/60 | HR 77 | Temp 98.4°F | Wt 208.0 lb

## 2012-01-22 DIAGNOSIS — K5909 Other constipation: Secondary | ICD-10-CM

## 2012-01-22 DIAGNOSIS — F0150 Vascular dementia without behavioral disturbance: Secondary | ICD-10-CM

## 2012-01-22 DIAGNOSIS — I5032 Chronic diastolic (congestive) heart failure: Secondary | ICD-10-CM

## 2012-01-22 DIAGNOSIS — N259 Disorder resulting from impaired renal tubular function, unspecified: Secondary | ICD-10-CM

## 2012-01-22 DIAGNOSIS — I4891 Unspecified atrial fibrillation: Secondary | ICD-10-CM

## 2012-01-22 DIAGNOSIS — E1165 Type 2 diabetes mellitus with hyperglycemia: Secondary | ICD-10-CM

## 2012-01-22 DIAGNOSIS — K5904 Chronic idiopathic constipation: Secondary | ICD-10-CM

## 2012-01-22 DIAGNOSIS — E1129 Type 2 diabetes mellitus with other diabetic kidney complication: Secondary | ICD-10-CM

## 2012-01-22 DIAGNOSIS — R748 Abnormal levels of other serum enzymes: Secondary | ICD-10-CM

## 2012-01-22 NOTE — Assessment & Plan Note (Signed)
Bad but not clearly progressed since last visit Needs help with shower but does other ADLs Son/brother help with haircut and shave

## 2012-01-22 NOTE — Assessment & Plan Note (Signed)
Off amiodarone and statin but wife may still be giving She will review his med containers with our discharge med list

## 2012-01-22 NOTE — Assessment & Plan Note (Signed)
Was high from prednisone for gout Hopefully will be settling down

## 2012-01-22 NOTE — Assessment & Plan Note (Signed)
Worse during recent admit to [facility] Reviewed the records Just saw Dr Jenene Slicker not repeat lab work

## 2012-01-22 NOTE — Assessment & Plan Note (Signed)
Regular now Off the amiodarone now Still on coumadin

## 2012-01-22 NOTE — Assessment & Plan Note (Signed)
Will add daily miralax

## 2012-01-22 NOTE — Progress Notes (Signed)
Subjective:    Patient ID: [name], male    DOB: 01-06-1932, 78 y.o.   MRN: [id number]  HPI Here with wife She is confused about his meds---have been changed a lot Just saw Dr Cherylann Ratel last week---repeat blood work is pending  Does have some "stinging" in his left chest No clear angina but occ pressure (he is vague) Breathing is fair  Fell 10 days ago and then again 2 days ago Trouble getting him back up Right knee is giving out on him Still using the walker  Voiding okay  Just finishing the prednisone for the gout Sugars running high Wife has giving extra insulin during this time No hypoglycemic reactions  Memory has been poor per wife He doesn't see this though  Current Outpatient Prescriptions on File Prior to Visit  Medication Sig Dispense Refill  . amLODipine (NORVASC) 5 MG tablet Take 2 tablets (10 mg total) by mouth daily.  60 tablet  0  . colchicine 0.6 MG tablet Take 1 tablet (0.6 mg total) by mouth 2 (two) times daily.  60 tablet  2  . fluticasone (FLONASE) 50 MCG/ACT nasal spray 2 sprays by Nasal route as needed.        Marland Kitchen glipiZIDE (GLUCOTROL XL) 10 MG 24 hr tablet Take 1 tablet (10 mg total) by mouth daily.  90 tablet  3  . glucose blood (BAYER CONTOUR TEST) test strip Use as instructed, check blood sugar three times daily       . hydrALAZINE (APRESOLINE) 50 MG tablet Take 1 tablet (50 mg total) by mouth 3 (three) times daily.  90 tablet  6  . insulin detemir (LEVEMIR FLEXPEN) 100 UNIT/ML injection Inject 12 Units into the skin daily.  9 mL  12  . Insulin Pen Needle (PEN NEEDLES 5/16") 30G X 8 MM MISC Inject 20 Units into the skin as directed. As directed.  100 each  6  . isosorbide mononitrate (IMDUR) 30 MG 24 hr tablet Take 1 tablet (30 mg total) by mouth 2 (two) times daily.  60 tablet  6  . Multiple Vitamin (MULTIVITAMIN) tablet Take 1 tablet by mouth daily.        . nitroGLYCERIN (NITROSTAT) 0.4 MG SL tablet Place 1 tablet (0.4 mg total) under the tongue  every 5 (five) minutes as needed.  30 tablet  3  . omeprazole (PRILOSEC) 20 MG capsule Take 20 mg by mouth daily.        . Tamsulosin HCl (FLOMAX) 0.4 MG CAPS Take 0.4 mg by mouth.        . traZODone (DESYREL) 50 MG tablet Take 1-2 tablets (50-100 mg total) by mouth at bedtime.  60 tablet  1  . warfarin (COUMADIN) 5 MG tablet TAKE 1+1/2 TABLETS DAILY OR AS DIRECTED  60 tablet  3    Allergies  Allergen Reactions  . Buspirone Hcl     REACTION: unknown  . Sulfonamide Derivatives     REACTION: unspecified    Past Medical History  Diagnosis Date  . Allergy   . Anemia   . GERD (gastroesophageal reflux disease)   . Hypertension   . Osteoarthritis   . Hyperlipidemia   . BPH (benign prostatic hyperplasia)     Dr. Achilles Dunk  . Diastolic CHF, acute   . S/P ablation of atrial fibrillation   . Anxiety   . 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 Diamond Nickel  . Allergic rhinitis   . CAD (coronary artery disease)  Dr. Diamond Nickel  . Diabetes mellitus type II   . Atrial flutter 07/1999  . Atrial tachycardia 03/2000  . Chest pain 03/2000  . Vascular dementia     Past Surgical History  Procedure Date  . Angioplasty     multiple, and stents  . Coronary artery bypass graft 01/2000  . Circumcision 12/2005  . Coronary stent placement 07/2003    RCA  . Circumcision 12/2005    Family History  Problem Relation Age of Onset  . Diabetes Mother   . Kidney disease Mother     ESRD  . Heart disease Father     CVA  . Stroke Father 45    deceased from same  . Hypertension Sister   . Coronary artery disease Sister     History   Social History  . Marital Status: Married    Spouse Name: N/A    Number of Children: 4  . Years of Education: N/A   Occupational History  . retired     disabled due to CAD   Social History Main Topics  . Smoking status: Never Smoker   . Smokeless tobacco: Former Neurosurgeon    Types: Chew    Quit date: 01/30/1985  . Alcohol Use: No     Comment: Quit 30 years  ago  . Drug Use: No  . Sexually Active: Not on file   Other Topics Concern  . Not on file   Social History Narrative   Married with 4 children   Review of Systems Some constipation---very bad time last week. Used MOM Sleeping okay with the 1 trazodone Appetite is not very good    Objective:   Physical Exam  Constitutional: He appears well-developed. No distress.  Neck: No thyromegaly present.  Cardiovascular: Normal rate and regular rhythm.  Exam reveals no gallop.        ?slight systolic murmur  Pulmonary/Chest: Effort normal and breath sounds normal. No respiratory distress. He has no wheezes. He has no rales.  Musculoskeletal: He exhibits edema.       Trace edema only  Lymphadenopathy:    He has no cervical adenopathy.          Assessment & Plan:

## 2012-01-22 NOTE — Patient Instructions (Signed)
Please start miralax (polyethylene glycol)--1 capful daily in a full glass of water

## 2012-01-22 NOTE — Assessment & Plan Note (Signed)
Still with sig fatigue and DOE Seems to be neutral weight status May want to consider hospice if further functional decline

## 2012-01-26 ENCOUNTER — Telehealth: Payer: Self-pay

## 2012-01-26 NOTE — Telephone Encounter (Signed)
Jan PT with [facility] left v/m to report missed PT visit with pt today; pts wife called Jan this afternoon and pt was tired today and requested rescheduled next week. Jan does not need call back.

## 2012-01-26 NOTE — Telephone Encounter (Signed)
Noted  

## 2012-02-05 ENCOUNTER — Telehealth: Payer: Self-pay

## 2012-02-05 NOTE — Telephone Encounter (Signed)
noted 

## 2012-02-05 NOTE — Telephone Encounter (Signed)
Jan PT with [facility] left v/m that pt was not going to be home at PT appt Fri; so pt missed PT visit. Jan said she would do PT visit this week. No need to call Jan unless Dr Alphonsus Sias needs to speak with her.

## 2012-02-06 ENCOUNTER — Telehealth: Payer: Self-pay

## 2012-02-06 NOTE — Telephone Encounter (Signed)
[name] with [facility] needs verbal orders for frequency of nurse visits.Please advise.

## 2012-02-06 NOTE — Telephone Encounter (Signed)
Spoke with nurse and they also provided coumadin visits, she could do his PT/INR and call us with the results. Please advise if you would like this done.

## 2012-02-06 NOTE — Telephone Encounter (Signed)
I didn't know he was getting RN visits---I thought just PT Would be okay to do RN assessment---to review meds, etc Not sure he needs ongoing RN visits

## 2012-02-07 ENCOUNTER — Telehealth: Payer: Self-pay

## 2012-02-07 NOTE — Telephone Encounter (Signed)
Spoke with wife and she would rather the home health nurse do the pt/inr only because every time they come for the coumadin visit she is charged $25.00 and per the wife they don't have $25.00 every time they come, that's why she sometimes cancel the visits. Please advise

## 2012-02-07 NOTE — Telephone Encounter (Signed)
[name] PT with [facility] left v/m that pt is discharged from PT today; pt strength much improved; able to go on errands with wife. Nursing will continue for 2 more weeks. [name] does not require call back.

## 2012-02-07 NOTE — Telephone Encounter (Signed)
If we have no choice, that is okay

## 2012-02-07 NOTE — Telephone Encounter (Signed)
Noted  

## 2012-02-07 NOTE — Telephone Encounter (Signed)
Spoke with hospice nurse and advised results She will do the PT/INR checks

## 2012-02-07 NOTE — Telephone Encounter (Signed)
I would prefer he come here Check with his wife and see if he can bring him for his protimes We have had delays in results with home health that have had negative consequences

## 2012-02-12 ENCOUNTER — Other Ambulatory Visit: Payer: Self-pay | Admitting: *Deleted

## 2012-02-12 MED ORDER — ISOSORBIDE MONONITRATE ER 30 MG PO TB24: 30.0000 mg | ORAL_TABLET | Freq: Two times a day (BID) | ORAL | Status: DC

## 2012-02-12 NOTE — Telephone Encounter (Signed)
Refilled Isosorbide. 

## 2012-02-13 ENCOUNTER — Other Ambulatory Visit: Payer: Self-pay | Admitting: *Deleted

## 2012-02-13 MED ORDER — GLIPIZIDE ER 10 MG PO TB24: 10.0000 mg | ORAL_TABLET | Freq: Every day | ORAL | Status: AC

## 2012-02-14 DIAGNOSIS — I4891 Unspecified atrial fibrillation: Secondary | ICD-10-CM

## 2012-02-14 DIAGNOSIS — E119 Type 2 diabetes mellitus without complications: Secondary | ICD-10-CM

## 2012-02-14 DIAGNOSIS — I509 Heart failure, unspecified: Secondary | ICD-10-CM

## 2012-02-14 DIAGNOSIS — I739 Peripheral vascular disease, unspecified: Secondary | ICD-10-CM

## 2012-02-15 ENCOUNTER — Ambulatory Visit (INDEPENDENT_AMBULATORY_CARE_PROVIDER_SITE_OTHER): Payer: No Typology Code available for payment source | Admitting: General Practice

## 2012-02-15 ENCOUNTER — Telehealth: Payer: Self-pay

## 2012-02-15 ENCOUNTER — Ambulatory Visit: Payer: No Typology Code available for payment source

## 2012-02-15 DIAGNOSIS — I4891 Unspecified atrial fibrillation: Secondary | ICD-10-CM

## 2012-02-15 LAB — POCT INR: INR: 1.6

## 2012-02-15 NOTE — Telephone Encounter (Signed)
[place] Day to call Bailey Mech at [phone] with PT/INR . Joni Reining can be reached at [phone].

## 2012-02-15 NOTE — Patient Instructions (Addendum)
Take 10 mg today and 7.5 mg tomorrow and then continue to take 1 tablet every day except take 1 1/2 on Monday and Thursdays.  Re-check in 1 week. Orders given to [place], RN @ [facility]

## 2012-02-22 ENCOUNTER — Ambulatory Visit (INDEPENDENT_AMBULATORY_CARE_PROVIDER_SITE_OTHER): Payer: No Typology Code available for payment source | Admitting: General Practice

## 2012-02-22 DIAGNOSIS — I4891 Unspecified atrial fibrillation: Secondary | ICD-10-CM

## 2012-02-22 NOTE — Patient Instructions (Signed)
Take 10 mg today and then change dose to 5 mg all days except 7.5 on Mon, Thurs and Sat.   Re-check in 10 days.Orders given to [place], RN @ [facility]

## 2012-02-26 ENCOUNTER — Telehealth: Payer: Self-pay | Admitting: Internal Medicine

## 2012-02-26 MED ORDER — ALPRAZOLAM 0.25 MG PO TABS: 0.2500 mg | ORAL_TABLET | Freq: Three times a day (TID) | ORAL | Status: DC | PRN

## 2012-02-26 NOTE — Telephone Encounter (Signed)
°  Caller: [name]/Patient; Phone: [phone]; Reason for Call: Patient calling, their daughter passed away on 2022/02/27 in [place] ([name]).  She is asking for something for his nerves to help him through this time.   Uses [facility] as noted in his chart.  (They have no details of her death.  She will be brought back to [place] on Tuesday for burial.)

## 2012-02-26 NOTE — Telephone Encounter (Signed)
[facility] Triage Call Report Triage Record Num: [id number] Operator: Roselyn Meier Patient Name: [name] Call Date & Time: 02/24/2012 9:11:00AM Patient Phone: [phone] PCP: [facility] Patient Gender: Male PCP Fax : [phone] Patient DOB: March 12, 1931 Practice Name: [facility] Reason for Call: Caller: Valerie/Spouse; PCP: [facility] (Family Practice); CB#: [phone]; Call regarding Medication; Caller reports her daughter passed away on 03-12-2012 and pt is usually prescribed Valium from Dr Alphonsus Sias and caller is requesting a prescription for pt. Triaged patient per Depression Protocol. See Provider within 24 Hours Disposition for 'Unable to cope'. RN called on call MD who states pt would need to be seen before RX could be prescribed. RN informed caller and offered an appt. Caller states she will wait until Monday to contact office. Protocol(s) Used: Depression Recommended Outcome per Protocol: See Provider within 24 hours Reason for Outcome: Unable to cope Care Advice: ~ 02/24/2012 9:25:00AM Page 1 of 1 [id number]

## 2012-02-26 NOTE — Telephone Encounter (Signed)
Okay to send Rx for alprazolam  0.25 1-2 tid prn for nerves #60 x 0

## 2012-02-26 NOTE — Telephone Encounter (Signed)
rx called into pharmacy Spoke with patient and advised results   

## 2012-03-04 ENCOUNTER — Ambulatory Visit (INDEPENDENT_AMBULATORY_CARE_PROVIDER_SITE_OTHER): Payer: No Typology Code available for payment source | Admitting: General Practice

## 2012-03-04 DIAGNOSIS — I4891 Unspecified atrial fibrillation: Secondary | ICD-10-CM

## 2012-03-04 NOTE — Patient Instructions (Signed)
Take 10 mg today and then change dose to 5 mg all days except 7.5 on Mon, Thurs and Sat.   Re-check in 10 days.Orders given to Nicole, RN @ [facility]  

## 2012-03-05 ENCOUNTER — Encounter: Payer: Self-pay | Admitting: Internal Medicine

## 2012-03-05 ENCOUNTER — Ambulatory Visit (INDEPENDENT_AMBULATORY_CARE_PROVIDER_SITE_OTHER): Payer: No Typology Code available for payment source | Admitting: Internal Medicine

## 2012-03-05 VITALS — BP 160/70 | HR 91 | Temp 98.0°F | Wt 235.0 lb

## 2012-03-05 DIAGNOSIS — M1710 Unilateral primary osteoarthritis, unspecified knee: Secondary | ICD-10-CM

## 2012-03-05 DIAGNOSIS — R609 Edema, unspecified: Secondary | ICD-10-CM

## 2012-03-05 DIAGNOSIS — F4321 Adjustment disorder with depressed mood: Secondary | ICD-10-CM

## 2012-03-05 MED ORDER — FUROSEMIDE 40 MG PO TABS: 40.0000 mg | ORAL_TABLET | Freq: Every day | ORAL | Status: DC | PRN

## 2012-03-05 NOTE — Assessment & Plan Note (Signed)
Having a hard time with his beloved daughter's suicide Discussed counseling with hospice

## 2012-03-05 NOTE — Patient Instructions (Signed)
Please restart the gout medicine, colchicine. If the knee pain continues, call for appointment for right knee injection. Please call [facility] to speak to a bereavement counselor. Please restart furosemide 40mg  daily as needed to reduce your leg swelling. I hope that you will only need it for a few days at a time.

## 2012-03-05 NOTE — Progress Notes (Signed)
Subjective:    Patient ID: [name], male    DOB: 15-Dec-1931, 79 y.o.   MRN: [id number]  HPI Here with wife and sister in law (who is staying with them to help) Daughter just died---suicide Very difficult time  Having lots of trouble with both knees and ankles Ran out of gout meds Needed wheelchair in [facility] [place] 4 days ago---then fell to knees today He thinks his knees are giving out---due to pain Had been taking the colchicine regularly but ran out 5 days ago and pharmacy had to order  Did have cortisone shot in left knee by Dr Gavin Potters when he was in the hospital in December Weight is up 8# or more from his usual baseline   Current Outpatient Prescriptions on File Prior to Visit  Medication Sig Dispense Refill  . ALPRAZolam (XANAX) 0.25 MG tablet Take 1-2 tablets (0.25-0.5 mg total) by mouth 3 (three) times daily as needed.  60 tablet  0  . amLODipine (NORVASC) 5 MG tablet Take 2 tablets (10 mg total) by mouth daily.  60 tablet  0  . colchicine 0.6 MG tablet Take 1 tablet (0.6 mg total) by mouth 2 (two) times daily.  60 tablet  2  . fluticasone (FLONASE) 50 MCG/ACT nasal spray 2 sprays by Nasal route as needed.        Marland Kitchen glipiZIDE (GLUCOTROL XL) 10 MG 24 hr tablet Take 1 tablet (10 mg total) by mouth daily.  30 tablet  5  . glucose blood (BAYER CONTOUR TEST) test strip Use as instructed, check blood sugar three times daily       . hydrALAZINE (APRESOLINE) 50 MG tablet Take 1 tablet (50 mg total) by mouth 3 (three) times daily.  90 tablet  6  . insulin detemir (LEVEMIR FLEXPEN) 100 UNIT/ML injection Inject 12 Units into the skin daily.  9 mL  12  . Insulin Pen Needle (PEN NEEDLES 5/16") 30G X 8 MM MISC Inject 20 Units into the skin as directed. As directed.  100 each  6  . isosorbide mononitrate (IMDUR) 30 MG 24 hr tablet Take 1 tablet (30 mg total) by mouth 2 (two) times daily.  60 tablet  1  . Multiple Vitamin (MULTIVITAMIN) tablet Take 1 tablet by mouth daily.        .  nitroGLYCERIN (NITROSTAT) 0.4 MG SL tablet Place 1 tablet (0.4 mg total) under the tongue every 5 (five) minutes as needed.  30 tablet  3  . omeprazole (PRILOSEC) 20 MG capsule Take 20 mg by mouth daily.        . polyethylene glycol (MIRALAX / GLYCOLAX) packet Take 17 g by mouth daily.      . Tamsulosin HCl (FLOMAX) 0.4 MG CAPS Take 0.4 mg by mouth.        . traZODone (DESYREL) 50 MG tablet Take 1-2 tablets (50-100 mg total) by mouth at bedtime.  60 tablet  1  . warfarin (COUMADIN) 5 MG tablet TAKE 1+1/2 TABLETS DAILY OR AS DIRECTED  60 tablet  3    Allergies  Allergen Reactions  . Buspirone Hcl     REACTION: unknown  . Sulfonamide Derivatives     REACTION: unspecified    Past Medical History  Diagnosis Date  . Allergy   . Anemia   . GERD (gastroesophageal reflux disease)   . Hypertension   . Osteoarthritis   . Hyperlipidemia   . BPH (benign prostatic hyperplasia)     Dr. Achilles Dunk  . Diastolic CHF,  acute   . S/P ablation of atrial fibrillation   . Anxiety   . 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 Diamond Nickel  . Allergic rhinitis   . CAD (coronary artery disease)     Dr. Diamond Nickel  . Diabetes mellitus type II   . Atrial flutter 07/1999  . Atrial tachycardia 03/2000  . Chest pain 03/2000  . Vascular dementia     Past Surgical History  Procedure Date  . Angioplasty     multiple, and stents  . Coronary artery bypass graft 01/2000  . Circumcision 12/2005  . Coronary stent placement 07/2003    RCA  . Circumcision 12/2005    Family History  Problem Relation Age of Onset  . Diabetes Mother   . Kidney disease Mother     ESRD  . Heart disease Father     CVA  . Stroke Father 55    deceased from same  . Hypertension Sister   . Coronary artery disease Sister     History   Social History  . Marital Status: Married    Spouse Name: N/A    Number of Children: 4  . Years of Education: N/A   Occupational History  . retired     disabled due to CAD   Social History  Main Topics  . Smoking status: Never Smoker   . Smokeless tobacco: Former Neurosurgeon    Types: Chew    Quit date: 01/30/1985  . Alcohol Use: No     Comment: Quit 30 years ago  . Drug Use: No  . Sexually Active: Not on file   Other Topics Concern  . Not on file   Social History Narrative   Married with 4 children   Review of Systems Not sleeping well---even with med Eating okay still Breathing is hard at times Some chest burning but not persistent    Objective:   Physical Exam  Constitutional: He appears well-developed. No distress.  Cardiovascular: Normal rate and regular rhythm.  Exam reveals no gallop.   No murmur heard. Pulmonary/Chest: Effort normal and breath sounds normal. No respiratory distress. He has no wheezes. He has no rales.  Musculoskeletal: He exhibits edema.       Tense calf edema Clear effusion in right knee Ankles with edema but no clear effusions  Psychiatric:       Sad and withdrawn Appears appropriate to setting but more noticeable than his wife          Assessment & Plan:

## 2012-03-05 NOTE — Assessment & Plan Note (Signed)
Marked weight gain and calf edema This is likely adding to his instability and pain Will add back lasix for prn---try to limit use

## 2012-03-05 NOTE — Assessment & Plan Note (Signed)
Clearly has synovitis in right knee now Trouble walking with pain Could be gout also  Will have them restart the colchicine If pain persists, return for knee drainage and cortisone

## 2012-03-11 ENCOUNTER — Other Ambulatory Visit: Payer: Self-pay

## 2012-03-12 ENCOUNTER — Ambulatory Visit: Payer: No Typology Code available for payment source | Admitting: Internal Medicine

## 2012-03-12 DIAGNOSIS — Z0289 Encounter for other administrative examinations: Secondary | ICD-10-CM

## 2012-03-13 ENCOUNTER — Ambulatory Visit (INDEPENDENT_AMBULATORY_CARE_PROVIDER_SITE_OTHER): Payer: No Typology Code available for payment source | Admitting: General Practice

## 2012-03-13 DIAGNOSIS — I4891 Unspecified atrial fibrillation: Secondary | ICD-10-CM

## 2012-03-13 NOTE — Patient Instructions (Signed)
Take 7.5 mg today and then take 7. 5 mg all days except 5 mg on Mon, Wed and Friday.   Re-check in 7 - 10 days.  Orders given to [place], RN @ [facility]

## 2012-03-18 ENCOUNTER — Ambulatory Visit: Payer: No Typology Code available for payment source | Admitting: Cardiovascular Disease

## 2012-03-20 ENCOUNTER — Ambulatory Visit (INDEPENDENT_AMBULATORY_CARE_PROVIDER_SITE_OTHER): Payer: No Typology Code available for payment source | Admitting: Cardiovascular Disease

## 2012-03-20 ENCOUNTER — Telehealth: Payer: Self-pay

## 2012-03-20 ENCOUNTER — Encounter: Payer: Self-pay | Admitting: Cardiovascular Disease

## 2012-03-20 ENCOUNTER — Ambulatory Visit (INDEPENDENT_AMBULATORY_CARE_PROVIDER_SITE_OTHER): Payer: No Typology Code available for payment source | Admitting: General Practice

## 2012-03-20 VITALS — BP 164/72 | HR 80 | Ht 69.0 in | Wt 227.2 lb

## 2012-03-20 DIAGNOSIS — R0602 Shortness of breath: Secondary | ICD-10-CM

## 2012-03-20 DIAGNOSIS — I5032 Chronic diastolic (congestive) heart failure: Secondary | ICD-10-CM

## 2012-03-20 DIAGNOSIS — I10 Essential (primary) hypertension: Secondary | ICD-10-CM

## 2012-03-20 DIAGNOSIS — R609 Edema, unspecified: Secondary | ICD-10-CM

## 2012-03-20 DIAGNOSIS — I7389 Other specified peripheral vascular diseases: Secondary | ICD-10-CM

## 2012-03-20 DIAGNOSIS — R079 Chest pain, unspecified: Secondary | ICD-10-CM

## 2012-03-20 DIAGNOSIS — I4891 Unspecified atrial fibrillation: Secondary | ICD-10-CM

## 2012-03-20 DIAGNOSIS — E7800 Pure hypercholesterolemia, unspecified: Secondary | ICD-10-CM

## 2012-03-20 MED ORDER — ISOSORBIDE MONONITRATE ER 60 MG PO TB24
60.0000 mg | ORAL_TABLET | Freq: Two times a day (BID) | ORAL | Status: AC
Start: 2012-03-20 — End: ?

## 2012-03-20 MED ORDER — HYDRALAZINE HCL 100 MG PO TABS: 100.0000 mg | ORAL_TABLET | Freq: Three times a day (TID) | ORAL | Status: AC

## 2012-03-20 MED ORDER — ISOSORBIDE MONONITRATE ER 60 MG PO TB24: 30.0000 mg | ORAL_TABLET | Freq: Two times a day (BID) | ORAL | Status: DC

## 2012-03-20 NOTE — Assessment & Plan Note (Addendum)
On diuretic daily. We'll need to proceed cautiously given renal dysfunction. Suspect most of his edema is from amlodipine though he does report having very mild shortness of breath, mild fullness around his abdomen.

## 2012-03-20 NOTE — Assessment & Plan Note (Signed)
Cholesterol poorly controlled. Need to restart statin alternate approaches. We'll address with him on his next visit

## 2012-03-20 NOTE — Patient Instructions (Addendum)
Please increase the  hydralazine to 100 mg three times a day (up from 50 mg ) Also increase the isosorbide to 60 mg in the Am, 60 mg in the PM (you were take 30 mg before)  STOP THE AMLODIPINE Stay on the lasix   Please call us if you have new issues that need to be addressed before your next appt.  Your physician wants you to follow-up in: 1 month.

## 2012-03-20 NOTE — Assessment & Plan Note (Signed)
Edema seems to have worsened again on amlodipine.  We will hold the amlodipine and increase his isosorbide and hydralazine for blood pressure control. We have suggested he continue on his diuretic. Lasix so far has not made any significant improvement in the edema.

## 2012-03-20 NOTE — Assessment & Plan Note (Signed)
We'll need to address his lipids with him on next visit

## 2012-03-20 NOTE — Patient Instructions (Signed)
Continue to take 7. 5 mg all days except 5 mg on Mon, Wed and Friday.   Re-check in 7 - 10 days.  Orders given to Nicole, RN @ [facility]  

## 2012-03-20 NOTE — Telephone Encounter (Signed)
Pt's wife called to let us know pt was already off the amlodipine but he told pt to stop this med when he saw pt today I told her I would make Dr. Mariah Milling aware and they should continue with original plan per his instructions today Understanding verb

## 2012-03-20 NOTE — Assessment & Plan Note (Signed)
To compensate for his amlodipine, will increase hydralazine to 100 mg 3 times a day, isosorbide 60 mg twice a day. We'll see him back in several weeks' time to see if edema has improved by holding amlodipine. This has worked in the past with significant improvement.

## 2012-03-20 NOTE — Telephone Encounter (Signed)
If he is not on amlodipine, edema has gotten progressively worse Could repeat echocardiogram to evaluate right ventricular systolic pressures If pressure is normal, would limit diuretic use. Edema could be secondary to lymphedema or worsening renal function.  Can you see if he is interested in repeating his echo

## 2012-03-20 NOTE — Progress Notes (Signed)
Patient ID: [name], male    DOB: 1931-07-11, 79 y.o.   MRN: [id number]  HPI Comments: 79 yo AAM with history of CAD s/p CABG and stent in [place] in 2007, HTN, DM, paroxysmal atrial fibrillation, history of diastolic heart failure, Hyperlipidemia, CRI,   known PVD with reduced ABI in the past who Presents today for routine followup.  He reports that he has been having worsening leg edema over the past several months. Diuretic has been restarted for his edema with no significant improvement. Wife notes he has started to have some skin breakdown in his legs. Right leg is worse than the left leg. Right knee has significant effusion is well. He has been having more difficulty walking secondary to leg swelling.  In the past, amlodipine was held with improvement of his edema. Amlodipine is back on his list since October 2013.  He denies any significant chest pain. He has had significant stress at home with the death of his daughter and his brother in the last week.  Stress test May 2012 shows no ischemia Lower extremity ultrasound in 2012 showing greater than 50% left SFA disease  EKG shows sinus rhythm with rate 80 beats per minute with ST abnormality in the anterolateral leads       Outpatient Encounter Prescriptions as of 03/20/2012  Medication Sig Dispense Refill  . ALPRAZolam (XANAX) 0.25 MG tablet Take 1-2 tablets (0.25-0.5 mg total) by mouth 3 (three) times daily as needed.  60 tablet  0  . colchicine 0.6 MG tablet Take 1 tablet (0.6 mg total) by mouth 2 (two) times daily.  60 tablet  2  . fluticasone (FLONASE) 50 MCG/ACT nasal spray 2 sprays by Nasal route as needed.        . furosemide (LASIX) 40 MG tablet Take 1 tablet (40 mg total) by mouth daily as needed.  30 tablet  1  . glipiZIDE (GLUCOTROL XL) 10 MG 24 hr tablet Take 1 tablet (10 mg total) by mouth daily.  30 tablet  5  . glucose blood (BAYER CONTOUR TEST) test strip Use as instructed, check blood sugar three times daily        . insulin detemir (LEVEMIR FLEXPEN) 100 UNIT/ML injection Inject 12 Units into the skin daily.  9 mL  12  . Insulin Pen Needle (PEN NEEDLES 5/16") 30G X 8 MM MISC Inject 20 Units into the skin as directed. As directed.  100 each  6  . Multiple Vitamin (MULTIVITAMIN) tablet Take 1 tablet by mouth daily.        . nitroGLYCERIN (NITROSTAT) 0.4 MG SL tablet Place 1 tablet (0.4 mg total) under the tongue every 5 (five) minutes as needed.  30 tablet  3  . omeprazole (PRILOSEC) 20 MG capsule Take 20 mg by mouth daily.        . polyethylene glycol (MIRALAX / GLYCOLAX) packet Take 17 g by mouth daily.      . Tamsulosin HCl (FLOMAX) 0.4 MG CAPS Take 0.4 mg by mouth.        . traZODone (DESYREL) 50 MG tablet Take 1-2 tablets (50-100 mg total) by mouth at bedtime.  60 tablet  1  . warfarin (COUMADIN) 5 MG tablet TAKE 1+1/2 TABLETS DAILY OR AS DIRECTED  60 tablet  3  . amLODipine (NORVASC) 5 MG tablet Take 2 tablets (10 mg total) by mouth daily.  60 tablet  0  .  hydrALAZINE (APRESOLINE) 50 MG tablet Take 1 tablet (50 mg total)  by mouth 3 (three) times daily.  90 tablet  6  . isosorbide mononitrate (IMDUR) 30 MG 24 hr tablet Take 1 tablet (30 mg total) by mouth 2 (two) times daily.  60 tablet  1    Review of Systems  Constitutional: Negative.   HENT: Negative.   Eyes: Negative.   Cardiovascular: Negative.        Significant pitting edema bilateral lower extremities, right knee swelling.  Gastrointestinal: Negative.   Musculoskeletal: Positive for gait problem.  Skin: Negative.   Neurological: Negative.   Psychiatric/Behavioral: Negative.   All other systems reviewed and are negative.   BP 164/72  Pulse 80  Ht 5\' 9"  (1.753 m)  Wt 227 lb 4 oz (103.08 kg)  BMI 33.54 kg/m2  Physical Exam  Nursing note and vitals reviewed. Constitutional: He is oriented to person, place, and time. He appears well-developed and well-nourished.  HENT:  Head: Normocephalic.  Nose: Nose normal.  Mouth/Throat:  Oropharynx is clear and moist.  Eyes: Conjunctivae are normal. Pupils are equal, round, and reactive to light.  Neck: Normal range of motion. Neck supple. No JVD present.  Cardiovascular: Normal rate, regular rhythm, S1 normal, S2 normal and intact distal pulses.  Exam reveals no gallop and no friction rub.   Murmur heard.  Systolic murmur is present with a grade of 2/6  1-2+ pitting edema to below the knees bilaterally, right worse on left  Pulmonary/Chest: Effort normal and breath sounds normal. No respiratory distress. He has no wheezes. He has no rales. He exhibits no tenderness.  Abdominal: Soft. Bowel sounds are normal. He exhibits no distension. There is no tenderness.  Musculoskeletal: Normal range of motion. He exhibits edema. He exhibits no tenderness.  Lymphadenopathy:    He has no cervical adenopathy.  Neurological: He is alert and oriented to person, place, and time. Coordination normal.  Skin: Skin is warm and dry. No rash noted. No erythema.  Psychiatric: He has a normal mood and affect. His behavior is normal. Judgment and thought content normal.      Assessment and Plan

## 2012-03-21 ENCOUNTER — Other Ambulatory Visit: Payer: Self-pay

## 2012-03-21 DIAGNOSIS — R609 Edema, unspecified: Secondary | ICD-10-CM

## 2012-03-21 NOTE — Telephone Encounter (Signed)
pts wife informed She wishes to go ahead and schedule echo tx to [facility] to schedule

## 2012-03-27 ENCOUNTER — Ambulatory Visit (INDEPENDENT_AMBULATORY_CARE_PROVIDER_SITE_OTHER): Payer: No Typology Code available for payment source | Admitting: General Practice

## 2012-03-27 DIAGNOSIS — I4891 Unspecified atrial fibrillation: Secondary | ICD-10-CM

## 2012-03-27 NOTE — Patient Instructions (Addendum)
Continue to take 7. 5 mg all days except 5 mg on Mon, Wed and Friday.   Re-check in 7 - 10 days.  Orders given to [place], RN @ [facility]

## 2012-04-03 ENCOUNTER — Ambulatory Visit (INDEPENDENT_AMBULATORY_CARE_PROVIDER_SITE_OTHER): Payer: No Typology Code available for payment source | Admitting: General Practice

## 2012-04-03 DIAGNOSIS — I4891 Unspecified atrial fibrillation: Secondary | ICD-10-CM

## 2012-04-03 LAB — POCT INR: INR: 2.2

## 2012-04-04 ENCOUNTER — Other Ambulatory Visit: Payer: Self-pay | Admitting: Family Medicine

## 2012-04-09 ENCOUNTER — Telehealth: Payer: Self-pay

## 2012-04-09 NOTE — Telephone Encounter (Signed)
Ok to give verbal order.

## 2012-04-09 NOTE — Telephone Encounter (Signed)
Joni Reining with [facility] left v/m requesting verbal order for recertification to continue PT/INR.Please advise.

## 2012-04-10 NOTE — Telephone Encounter (Signed)
Spoke with nurse and advised results  

## 2012-04-12 ENCOUNTER — Ambulatory Visit (INDEPENDENT_AMBULATORY_CARE_PROVIDER_SITE_OTHER): Payer: No Typology Code available for payment source | Admitting: General Practice

## 2012-04-12 DIAGNOSIS — I4891 Unspecified atrial fibrillation: Secondary | ICD-10-CM

## 2012-04-12 LAB — POCT INR: INR: 1.6

## 2012-04-12 NOTE — Patient Instructions (Addendum)
Take extra half tablet today and tomorrow and then continue to take 7. 5 mg all days except 5 mg on Mon, Wed and Friday.   Re-check in 7 - 10 days.  Orders given to [place], RN @ [facility]

## 2012-04-16 ENCOUNTER — Other Ambulatory Visit: Payer: No Typology Code available for payment source

## 2012-04-18 ENCOUNTER — Ambulatory Visit (INDEPENDENT_AMBULATORY_CARE_PROVIDER_SITE_OTHER): Payer: No Typology Code available for payment source | Admitting: Internal Medicine

## 2012-04-18 ENCOUNTER — Encounter: Payer: Self-pay | Admitting: Internal Medicine

## 2012-04-18 VITALS — BP 160/70 | HR 76 | Temp 98.4°F | Wt 221.0 lb

## 2012-04-18 DIAGNOSIS — F4321 Adjustment disorder with depressed mood: Secondary | ICD-10-CM

## 2012-04-18 DIAGNOSIS — I25810 Atherosclerosis of coronary artery bypass graft(s) without angina pectoris: Secondary | ICD-10-CM

## 2012-04-18 DIAGNOSIS — R609 Edema, unspecified: Secondary | ICD-10-CM

## 2012-04-18 NOTE — Assessment & Plan Note (Signed)
Had chest pain spell---does have angina but may be related to his grieving Very difficult time still [name] and family helping but lots of other family deaths since [name]'s

## 2012-04-18 NOTE — Assessment & Plan Note (Signed)
Still significant but they think it may be somewhat better

## 2012-04-18 NOTE — Progress Notes (Signed)
Subjective:    Patient ID: [name], male    DOB: [birth year], 79 y.o.   MRN: [id number]  HPI Here with wife Was hospitalized for chest pain Doesn't seem to have been his heart May have been due to more grieving with daughter's death BP had been very high so observed overnight Sent home the next day  Doing okay since home Does get some chest pain ---had some no right and took NTG today Breathing is short--even just sitting around Very limited with any activity  Left leg swelling-- may be better with diuretics and sitting in recliner Started on simvastatin and new meds from Dr Cherylann Ratel  Current Outpatient Prescriptions on File Prior to Visit  Medication Sig Dispense Refill  . ALPRAZolam (XANAX) 0.25 MG tablet Take 1-2 tablets (0.25-0.5 mg total) by mouth 3 (three) times daily as needed.  60 tablet  0  . COLCRYS 0.6 MG tablet TAKE ONE TABLET BY MOUTH TWICE DAILY  60 tablet  1  . fluticasone (FLONASE) 50 MCG/ACT nasal spray 2 sprays by Nasal route as needed.        . furosemide (LASIX) 40 MG tablet Take 1 tablet (40 mg total) by mouth daily as needed.  30 tablet  1  . glipiZIDE (GLUCOTROL XL) 10 MG 24 hr tablet Take 1 tablet (10 mg total) by mouth daily.  30 tablet  5  . glucose blood (BAYER CONTOUR TEST) test strip Use as instructed, check blood sugar three times daily       . hydrALAZINE (APRESOLINE) 100 MG tablet Take 1 tablet (100 mg total) by mouth 3 (three) times daily.  90 tablet  6  . insulin detemir (LEVEMIR FLEXPEN) 100 UNIT/ML injection Inject 12 Units into the skin daily.  9 mL  12  . Insulin Pen Needle (PEN NEEDLES 5/16") 30G X 8 MM MISC Inject 20 Units into the skin as directed. As directed.  100 each  6  . isosorbide mononitrate (IMDUR) 60 MG 24 hr tablet Take 1 tablet (60 mg total) by mouth 2 (two) times daily.  60 tablet  6  . Multiple Vitamin (MULTIVITAMIN) tablet Take 1 tablet by mouth daily.        . nitroGLYCERIN (NITROSTAT) 0.4 MG SL tablet Place 1 tablet (0.4  mg total) under the tongue every 5 (five) minutes as needed.  30 tablet  3  . omeprazole (PRILOSEC) 20 MG capsule Take 20 mg by mouth daily.        . polyethylene glycol (MIRALAX / GLYCOLAX) packet Take 17 g by mouth daily.      . Tamsulosin HCl (FLOMAX) 0.4 MG CAPS Take 0.4 mg by mouth.        . traZODone (DESYREL) 50 MG tablet Take 1-2 tablets (50-100 mg total) by mouth at bedtime.  60 tablet  1  . warfarin (COUMADIN) 5 MG tablet TAKE 1+1/2 TABLETS DAILY OR AS DIRECTED  60 tablet  3   No current facility-administered medications on file prior to visit.    Allergies  Allergen Reactions  . Buspirone Hcl     REACTION: unknown  . Sulfonamide Derivatives     REACTION: unspecified    Past Medical History  Diagnosis Date  . Allergy   . Anemia   . GERD (gastroesophageal reflux disease)   . Hypertension   . Osteoarthritis   . Hyperlipidemia   . BPH (benign prostatic hyperplasia)     Dr. Achilles Dunk  . Diastolic CHF, acute   . S/P ablation  of atrial fibrillation   . Anxiety   . 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 Diamond Nickel  . Allergic rhinitis   . CAD (coronary artery disease)     Dr. Diamond Nickel  . Diabetes mellitus type II   . Atrial flutter 07/1999  . Atrial tachycardia 03/2000  . Chest pain 03/2000  . Vascular dementia     Past Surgical History  Procedure Laterality Date  . Angioplasty      multiple, and stents  . Coronary artery bypass graft  01/2000  . Circumcision  12/2005  . Coronary stent placement  07/2003    RCA  . Circumcision  12/2005    Family History  Problem Relation Age of Onset  . Diabetes Mother   . Kidney disease Mother     ESRD  . Heart disease Father     CVA  . Stroke Father 75    deceased from same  . Hypertension Sister   . Coronary artery disease Sister   . Depression Daughter     History   Social History  . Marital Status: Married    Spouse Name: N/A    Number of Children: 4  . Years of Education: N/A   Occupational History  .  retired     disabled due to CAD   Social History Main Topics  . Smoking status: Never Smoker   . Smokeless tobacco: Former Neurosurgeon    Types: Chew    Quit date: 01/30/1985  . Alcohol Use: No     Comment: Quit 30 years ago  . Drug Use: No  . Sexually Active: Not on file   Other Topics Concern  . Not on file   Social History Narrative   Married with 4 children         Review of Systems Eating okay Not able to sleep--wife either due to daughter's suicide Crying still regularly    Objective:   Physical Exam  Constitutional: He appears well-developed. No distress.  Cardiovascular: Normal rate and regular rhythm.  Exam reveals no gallop.   No murmur heard. Pulmonary/Chest: Effort normal and breath sounds normal. No respiratory distress. He has no wheezes. He has no rales.  Musculoskeletal: He exhibits edema.  3-4+ tense calf edema  Psychiatric:  Clearly sad Some tears Appropriate interaction          Assessment & Plan:

## 2012-04-18 NOTE — Assessment & Plan Note (Signed)
Seems to have stable angina pattern Very little functional ability --- like Class 3 CHF type symptoms Stable edema

## 2012-04-19 ENCOUNTER — Encounter: Payer: Self-pay | Admitting: Cardiovascular Disease

## 2012-04-19 ENCOUNTER — Ambulatory Visit (INDEPENDENT_AMBULATORY_CARE_PROVIDER_SITE_OTHER): Payer: No Typology Code available for payment source | Admitting: Cardiovascular Disease

## 2012-04-19 VITALS — BP 164/74 | HR 76 | Ht 69.5 in | Wt 212.2 lb

## 2012-04-19 DIAGNOSIS — I10 Essential (primary) hypertension: Secondary | ICD-10-CM

## 2012-04-19 DIAGNOSIS — R109 Unspecified abdominal pain: Secondary | ICD-10-CM | POA: Insufficient documentation

## 2012-04-19 DIAGNOSIS — E1165 Type 2 diabetes mellitus with hyperglycemia: Secondary | ICD-10-CM

## 2012-04-19 DIAGNOSIS — R079 Chest pain, unspecified: Secondary | ICD-10-CM

## 2012-04-19 DIAGNOSIS — I5032 Chronic diastolic (congestive) heart failure: Secondary | ICD-10-CM

## 2012-04-19 DIAGNOSIS — I25810 Atherosclerosis of coronary artery bypass graft(s) without angina pectoris: Secondary | ICD-10-CM

## 2012-04-19 DIAGNOSIS — E7800 Pure hypercholesterolemia, unspecified: Secondary | ICD-10-CM

## 2012-04-19 DIAGNOSIS — I4891 Unspecified atrial fibrillation: Secondary | ICD-10-CM

## 2012-04-19 MED ORDER — METOPROLOL TARTRATE 25 MG PO TABS: 25.0000 mg | ORAL_TABLET | Freq: Two times a day (BID) | ORAL | Status: AC

## 2012-04-19 MED ORDER — CYCLOBENZAPRINE HCL 10 MG PO TABS: 10.0000 mg | ORAL_TABLET | Freq: Three times a day (TID) | ORAL | Status: DC | PRN

## 2012-04-19 MED ORDER — DOXAZOSIN MESYLATE 8 MG PO TABS: 8.0000 mg | ORAL_TABLET | Freq: Every day | ORAL | Status: AC

## 2012-04-19 NOTE — Assessment & Plan Note (Signed)
We have suggested he restart low-dose metoprolol 25 mg twice a day. If blood pressure continues to be elevated after one week, he can start Cardura 4 mg in the evening titrating upward to 4 mg twice a day

## 2012-04-19 NOTE — Patient Instructions (Addendum)
Please start metoprolol 25 mg twice a day If heart rate gets slow, cut the pill in 1/2 twice a day  Wait a week, then start cardura/doxazosin 1/2 pil to start in the pm After one week, if blood pressure continues to run high, take a 1/2 pill twice a day  Try 1/2 dose flexeril as needed for right side pain  See if Dr. Alphonsus Sias can check your lipids in 2  months (May 2014)  Please call us if you have new issues that need to be addressed before your next appt.  Your physician wants you to follow-up in: 3 months.

## 2012-04-19 NOTE — Assessment & Plan Note (Signed)
Right flank pain is atypical in nature. Likely musculoskeletal.

## 2012-04-19 NOTE — Assessment & Plan Note (Signed)
We have encouraged him to watch his fluid intake, stay on Lasix for any weight gain. He'll likely need this periodically through the week

## 2012-04-19 NOTE — Assessment & Plan Note (Signed)
Amiodarone held in the past for elevated LFTs. We will put him back on low-dose beta blocker metoprolol 25 mg twice a day.

## 2012-04-19 NOTE — Assessment & Plan Note (Signed)
He was started back on Zocor 20 mg daily in the hospital. We have suggested he have LFTs done in May when he sees Dr. Alphonsus Sias.

## 2012-04-19 NOTE — Progress Notes (Signed)
Patient ID: [name], male    DOB: January 03, 1932, 79 y.o.   MRN: [id number]  HPI Comments: 79 yo AAM with history of CAD s/p CABG and stent in [place] in 2007, HTN, DM, paroxysmal atrial fibrillation, history of diastolic heart failure, Hyperlipidemia, CRI,   known PVD with reduced ABI in the past who Presents today for routine followup.  He was seen last month for worsening edema and weight gain. Amlodipine was held and blood pressure medications changed. He has been more compliant with his diuretic. Weight is down over 10 pounds.   Recent hospital admission for right-sided chest pain. He was felt to be atypical in nature. Cardiac enzymes negative. Amiodarone was held and he was restarted on a statin, Zocor 20 mg daily. Creatinine in the hospital was 1.84 on March 9, normal LFTs with ALT 25, AST 39, creatinine 1.6 on March 10, total cholesterol 177, LDL 126   He has had significant stress at home with the death of his daughter and his brother in the last week.  Stress test May 2012 shows no ischemia Lower extremity ultrasound in 2012 showing greater than 50% left SFA disease  EKG shows sinus rhythm with rate 76 beats per minute with ST abnormality in the anterolateral leads       Outpatient Encounter Prescriptions as of 04/19/2012  Medication Sig Dispense Refill  . Cholecalciferol (VITAMIN D) 2000 UNITS CAPS Take 1 capsule by mouth daily.      Marland Kitchen COLCRYS 0.6 MG tablet TAKE ONE TABLET BY MOUTH TWICE DAILY  60 tablet  1  . fluticasone (FLONASE) 50 MCG/ACT nasal spray 2 sprays by Nasal route as needed.        . furosemide (LASIX) 40 MG tablet Take 1 tablet (40 mg total) by mouth daily as needed.  30 tablet  1  . glipiZIDE (GLUCOTROL XL) 10 MG 24 hr tablet Take 1 tablet (10 mg total) by mouth daily.  30 tablet  5  . glucose blood (BAYER CONTOUR TEST) test strip Use as instructed, check blood sugar three times daily       . hydrALAZINE (APRESOLINE) 100 MG tablet Take 1 tablet (100 mg total) by  mouth 3 (three) times daily.  90 tablet  6  . insulin detemir (LEVEMIR FLEXPEN) 100 UNIT/ML injection Inject 12 Units into the skin daily.  9 mL  12  . Insulin Pen Needle (PEN NEEDLES 5/16") 30G X 8 MM MISC Inject 20 Units into the skin as directed. As directed.  100 each  6  . isosorbide mononitrate (IMDUR) 60 MG 24 hr tablet Take 1 tablet (60 mg total) by mouth 2 (two) times daily.  60 tablet  6  . losartan (COZAAR) 50 MG tablet Take 50 mg by mouth daily.      . Multiple Vitamin (MULTIVITAMIN) tablet Take 1 tablet by mouth daily.        . nitroGLYCERIN (NITROSTAT) 0.4 MG SL tablet Place 1 tablet (0.4 mg total) under the tongue every 5 (five) minutes as needed.  30 tablet  3  . omeprazole (PRILOSEC) 20 MG capsule Take 20 mg by mouth daily.        . polyethylene glycol (MIRALAX / GLYCOLAX) packet Take 17 g by mouth daily.      . simvastatin (ZOCOR) 20 MG tablet Take 20 mg by mouth every evening.      . Tamsulosin HCl (FLOMAX) 0.4 MG CAPS Take 0.4 mg by mouth.        Marland Kitchen  traZODone (DESYREL) 50 MG tablet Take 1-2 tablets (50-100 mg total) by mouth at bedtime.  60 tablet  1  . warfarin (COUMADIN) 5 MG tablet TAKE 1+1/2 TABLETS DAILY OR AS DIRECTED  60 tablet  3  . cyclobenzaprine (FLEXERIL) 10 MG tablet Take 1 tablet (10 mg total) by mouth 3 (three) times daily as needed for muscle spasms.  30 tablet  0  . [DISCONTINUED] ALPRAZolam (XANAX) 0.25 MG tablet Take 1-2 tablets (0.25-0.5 mg total) by mouth 3 (three) times daily as needed.  60 tablet  0   No facility-administered encounter medications on file as of 04/19/2012.    Review of Systems  Constitutional: Negative.   HENT: Negative.   Eyes: Negative.   Cardiovascular: Negative.        Significant pitting edema bilateral lower extremities, pain in the right flank typically on palpation  Gastrointestinal: Negative.   Musculoskeletal: Positive for gait problem.  Skin: Negative.   Neurological: Negative.   Psychiatric/Behavioral: Negative.   All  other systems reviewed and are negative.   BP 164/74  Pulse 76  Ht 5' 9.5" (1.765 m)  Wt 212 lb 4 oz (96.276 kg)  BMI 30.9 kg/m2  Physical Exam  Nursing note and vitals reviewed. Constitutional: He is oriented to person, place, and time. He appears well-developed and well-nourished.  HENT:  Head: Normocephalic.  Nose: Nose normal.  Mouth/Throat: Oropharynx is clear and moist.  Eyes: Conjunctivae are normal. Pupils are equal, round, and reactive to light.  Neck: Normal range of motion. Neck supple. No JVD present.  Cardiovascular: Normal rate, regular rhythm, S1 normal, S2 normal and intact distal pulses.  Exam reveals no gallop and no friction rub.   Murmur heard.  Systolic murmur is present with a grade of 2/6  1-2+ pitting edema to below the knees bilaterally, right worse on left  Pulmonary/Chest: Effort normal and breath sounds normal. No respiratory distress. He has no wheezes. He has no rales. He exhibits no tenderness.  Abdominal: Soft. Bowel sounds are normal. He exhibits no distension. There is no tenderness.  Musculoskeletal: Normal range of motion. He exhibits edema. He exhibits no tenderness.  Lymphadenopathy:    He has no cervical adenopathy.  Neurological: He is alert and oriented to person, place, and time. Coordination normal.  Skin: Skin is warm and dry. No rash noted. No erythema.  Psychiatric: He has a normal mood and affect. His behavior is normal. Judgment and thought content normal.      Assessment and Plan

## 2012-04-19 NOTE — Assessment & Plan Note (Signed)
We have encouraged continued exercise, careful diet management in an effort to lose weight. 

## 2012-04-19 NOTE — Assessment & Plan Note (Signed)
Tender to palpation. Either musculoskeletal, unable to exclude hepatic. Less likely gallbladder. He is asking for help with symptoms. We will prescribe low-dose Flexeril and have suggested he cut a 10 mg pill in half and take this when necessary. We have warned him about sedation side effects or unsteady gait. His wife we'll closely monitor him. We'll try to stay away from NSAIDs given renal dysfunction. He could also try a hot pack. If symptoms do not improve, could do a right upper quadrant ultrasound.

## 2012-04-22 DIAGNOSIS — I4891 Unspecified atrial fibrillation: Secondary | ICD-10-CM

## 2012-04-22 DIAGNOSIS — Z5181 Encounter for therapeutic drug level monitoring: Secondary | ICD-10-CM

## 2012-04-22 DIAGNOSIS — E119 Type 2 diabetes mellitus without complications: Secondary | ICD-10-CM

## 2012-04-22 DIAGNOSIS — I739 Peripheral vascular disease, unspecified: Secondary | ICD-10-CM

## 2012-04-22 DIAGNOSIS — I509 Heart failure, unspecified: Secondary | ICD-10-CM

## 2012-04-25 ENCOUNTER — Ambulatory Visit (INDEPENDENT_AMBULATORY_CARE_PROVIDER_SITE_OTHER): Payer: No Typology Code available for payment source | Admitting: General Practice

## 2012-04-25 DIAGNOSIS — I4891 Unspecified atrial fibrillation: Secondary | ICD-10-CM

## 2012-04-25 LAB — POCT INR: INR: 2.1

## 2012-04-25 NOTE — Patient Instructions (Addendum)
Continue to take 7. 5 mg all days except 5 mg on Mon, Wed and Friday.   Re-check in 1 week.  Orders given to [place], RN @ [facility]

## 2012-04-30 ENCOUNTER — Other Ambulatory Visit: Payer: No Typology Code available for payment source

## 2012-05-02 ENCOUNTER — Other Ambulatory Visit (INDEPENDENT_AMBULATORY_CARE_PROVIDER_SITE_OTHER): Payer: No Typology Code available for payment source

## 2012-05-02 ENCOUNTER — Other Ambulatory Visit: Payer: Self-pay

## 2012-05-02 DIAGNOSIS — I4891 Unspecified atrial fibrillation: Secondary | ICD-10-CM

## 2012-05-02 DIAGNOSIS — I5032 Chronic diastolic (congestive) heart failure: Secondary | ICD-10-CM

## 2012-05-02 DIAGNOSIS — R609 Edema, unspecified: Secondary | ICD-10-CM

## 2012-05-03 ENCOUNTER — Ambulatory Visit (INDEPENDENT_AMBULATORY_CARE_PROVIDER_SITE_OTHER): Payer: No Typology Code available for payment source | Admitting: General Practice

## 2012-05-03 DIAGNOSIS — I4891 Unspecified atrial fibrillation: Secondary | ICD-10-CM

## 2012-05-03 LAB — POCT INR: INR: 2.2

## 2012-05-06 ENCOUNTER — Other Ambulatory Visit: Payer: Self-pay | Admitting: Internal Medicine

## 2012-05-07 ENCOUNTER — Ambulatory Visit (INDEPENDENT_AMBULATORY_CARE_PROVIDER_SITE_OTHER): Payer: No Typology Code available for payment source | Admitting: Family Medicine

## 2012-05-07 ENCOUNTER — Encounter: Payer: Self-pay | Admitting: Family Medicine

## 2012-05-07 VITALS — BP 140/70 | HR 58 | Temp 97.8°F | Ht 69.5 in | Wt 235.2 lb

## 2012-05-07 DIAGNOSIS — R609 Edema, unspecified: Secondary | ICD-10-CM

## 2012-05-07 NOTE — Patient Instructions (Addendum)
Increase your furosemide to 2 pills once daily for the next 2 days (tues and wed), then go back to one daily and schedule follow up with Dr Alphonsus Sias Thursday or Friday  If you develop worse shortness of breath or any chest pain - let us or Dr Mariah Milling know or go to hospital if needed  Avoid sodium in diet and keep elevating your legs  Start weighing yourself daily at home

## 2012-05-07 NOTE — Progress Notes (Signed)
Subjective:    Patient ID: [name], male    DOB: 09-04-1931, 79 y.o.   MRN: [id number]  HPI Here for leg edema  Hx of diastolic heart failure and CAD with bypass  Wt is up 23 lb today  He has baseline edema - this has worsened since mid last week  Saw Dr Mariah Milling who gave him some medicines - muscle relaxer- cyclobenzaprine  Has f/u in June   Lasix - has been on since the last time they were here  Ran out of that (this weekend)  Is short of breath sometimes-no more than usual  No chest or palpitations today (hx of a fib) He did have a knot on his leg- it is gone now (L lower)  He elevates legs whenever sitting  Does sleep in a recliner tilted back- this works well Does not wear his support hose    Patient Active Problem List  Diagnosis  . TUBULOVILLOUS ADENOMA, COLON  . DIABETES MELLITUS, TYPE II  . HYPERCHOLESTEROLEMIA  . ANXIETY  . HYPERTENSION  . CORONARY ATHEROSCLEROSIS, ARTERY BYPASS GRAFT  . ATRIAL FIBRILLATION  . DIASTOLIC HEART FAILURE, CHRONIC  . ATHEROSCLEROSIS W /INT CLAUDICATION  . PVD WITH CLAUDICATION  . ALLERGIC RHINITIS  . GERD  . DIVERTICULOSIS OF COLON  . RENAL INSUFFICIENCY  . BENIGN PROSTATIC HYPERTROPHY  . Osteoarthritis, knee  . OSTEOARTHRITIS  . DEGENERATIVE JOINT DISEASE, LUMBAR SPINE  . Type II or unspecified type diabetes mellitus with renal manifestations, uncontrolled(250.42)  . Elevated liver enzymes  . Edema  . Vascular dementia  . Constipation - functional  . Grieving  . Right flank pain   Past Medical History  Diagnosis Date  . Allergy   . Anemia   . GERD (gastroesophageal reflux disease)   . Hypertension   . Osteoarthritis   . Hyperlipidemia   . BPH (benign prostatic hyperplasia)     Dr. Achilles Dunk  . Diastolic CHF, acute   . S/P ablation of atrial fibrillation   . Anxiety   . 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 Diamond Nickel  . Allergic rhinitis   . CAD (coronary artery disease)     Dr. Diamond Nickel  . Diabetes  mellitus type II   . Atrial flutter 07/1999  . Atrial tachycardia 03/2000  . Chest pain 03/2000  . Vascular dementia    Past Surgical History  Procedure Laterality Date  . Angioplasty      multiple, and stents  . Coronary artery bypass graft  01/2000  . Circumcision  12/2005  . Coronary stent placement  07/2003    RCA  . Circumcision  12/2005   History  Substance Use Topics  . Smoking status: Never Smoker   . Smokeless tobacco: Former Neurosurgeon    Types: Chew    Quit date: 01/30/1985  . Alcohol Use: No     Comment: Quit 30 years ago   Family History  Problem Relation Age of Onset  . Diabetes Mother   . Kidney disease Mother     ESRD  . Heart disease Father     CVA  . Stroke Father 10    deceased from same  . Hypertension Sister   . Coronary artery disease Sister   . Depression Daughter    Allergies  Allergen Reactions  . Buspirone Hcl     REACTION: unknown  . Sulfonamide Derivatives     REACTION: unspecified   Current Outpatient Prescriptions on File Prior to Visit  Medication Sig  Dispense Refill  . Cholecalciferol (VITAMIN D) 2000 UNITS CAPS Take 1 capsule by mouth daily.      Marland Kitchen COLCRYS 0.6 MG tablet TAKE ONE TABLET BY MOUTH TWICE DAILY  60 tablet  1  . cyclobenzaprine (FLEXERIL) 10 MG tablet Take 1 tablet (10 mg total) by mouth 3 (three) times daily as needed for muscle spasms.  30 tablet  0  . doxazosin (CARDURA) 8 MG tablet Take 1 tablet (8 mg total) by mouth at bedtime.  30 tablet  6  . fluticasone (FLONASE) 50 MCG/ACT nasal spray 2 sprays by Nasal route as needed.        . furosemide (LASIX) 40 MG tablet TAKE 1 TABLET (40 MG TOTAL) BY MOUTH DAILY AS NEEDED.  30 tablet  0  . glipiZIDE (GLUCOTROL XL) 10 MG 24 hr tablet Take 1 tablet (10 mg total) by mouth daily.  30 tablet  5  . glucose blood (BAYER CONTOUR TEST) test strip Use as instructed, check blood sugar three times daily       . hydrALAZINE (APRESOLINE) 100 MG tablet Take 1 tablet (100 mg total) by mouth 3  (three) times daily.  90 tablet  6  . insulin detemir (LEVEMIR FLEXPEN) 100 UNIT/ML injection Inject 12 Units into the skin daily.  9 mL  12  . Insulin Pen Needle (PEN NEEDLES 5/16") 30G X 8 MM MISC Inject 20 Units into the skin as directed. As directed.  100 each  6  . isosorbide mononitrate (IMDUR) 60 MG 24 hr tablet Take 1 tablet (60 mg total) by mouth 2 (two) times daily.  60 tablet  6  . losartan (COZAAR) 50 MG tablet Take 50 mg by mouth daily.      . metoprolol tartrate (LOPRESSOR) 25 MG tablet Take 1 tablet (25 mg total) by mouth 2 (two) times daily.  60 tablet  6  . Multiple Vitamin (MULTIVITAMIN) tablet Take 1 tablet by mouth daily.        . nitroGLYCERIN (NITROSTAT) 0.4 MG SL tablet Place 1 tablet (0.4 mg total) under the tongue every 5 (five) minutes as needed.  30 tablet  3  . omeprazole (PRILOSEC) 20 MG capsule Take 20 mg by mouth daily.        . polyethylene glycol (MIRALAX / GLYCOLAX) packet Take 17 g by mouth daily.      . simvastatin (ZOCOR) 20 MG tablet Take 20 mg by mouth every evening.      . Tamsulosin HCl (FLOMAX) 0.4 MG CAPS Take 0.4 mg by mouth.        . traZODone (DESYREL) 50 MG tablet Take 1-2 tablets (50-100 mg total) by mouth at bedtime.  60 tablet  1  . warfarin (COUMADIN) 5 MG tablet TAKE 1+1/2 TABLETS DAILY OR AS DIRECTED  60 tablet  3   No current facility-administered medications on file prior to visit.    Review of Systems    Review of Systems  Constitutional: Negative for fever, appetite change,  and pos for wt gain  Eyes: Negative for pain and visual disturbance.  Respiratory: Negative for cough and shortness of breath. (no more sob than baseline)  Cardiovascular: Negative for cp or palpitations    Gastrointestinal: Negative for nausea, diarrhea and constipation.  Genitourinary: Negative for urgency and frequency.  Skin: Negative for pallor or rash   Neurological: Negative for weakness, light-headedness, numbness and headaches.  Hematological: Negative  for adenopathy. Does not bruise/bleed easily.  Psychiatric/Behavioral: Negative for dysphoric mood. The  patient is not nervous/anxious.      Objective:   Physical Exam  Constitutional: He appears well-developed and well-nourished. No distress.  obese and well appearing   HENT:  Head: Normocephalic and atraumatic.  Mouth/Throat: Oropharynx is clear and moist.  Eyes: Conjunctivae and EOM are normal. Pupils are equal, round, and reactive to light. No scleral icterus.  Neck: Normal range of motion. Neck supple. No JVD present.  Cardiovascular: Normal rate and regular rhythm.  Exam reveals no gallop.   Pulmonary/Chest: Effort normal and breath sounds normal. No respiratory distress. He has no wheezes.  Abdominal: Soft. Bowel sounds are normal. He exhibits no distension. There is no tenderness.  Musculoskeletal: He exhibits edema. He exhibits no tenderness.  bilat lower legs below knee 1-2 plus pitting edema without skin change or weeping No redness or heat or palp cords  Lymphadenopathy:    He has no cervical adenopathy.  Neurological: He is alert. He has normal reflexes.  Skin: Skin is warm and dry. No rash noted. No erythema. No pallor.  Psychiatric: He has a normal mood and affect.          Assessment & Plan:

## 2012-05-07 NOTE — Assessment & Plan Note (Signed)
Worsened lower extremity edema for about 5 days in pt with diastolic CHF and complex med hx  No increased sob or cardiac symptoms and lungs are fairly clear  This may have been triggered by cessation of lasix (ran out of it)  Will inc lasix to 80 mg today and 80 mg tomorrow- then return to 40 mg daily  Will start to weigh daily at home (up more than 20 lb here today) Disc sodium avoidance and impt of leg elevation  Will notify us or seek care if new symptoms or sob  F/u with PCP later this week  He may end up needing to see cardiology early depending on response as well

## 2012-05-10 ENCOUNTER — Ambulatory Visit (INDEPENDENT_AMBULATORY_CARE_PROVIDER_SITE_OTHER): Payer: No Typology Code available for payment source | Admitting: Internal Medicine

## 2012-05-10 ENCOUNTER — Encounter: Payer: Self-pay | Admitting: Internal Medicine

## 2012-05-10 VITALS — BP 130/70 | HR 68 | Temp 97.7°F | Wt 234.0 lb

## 2012-05-10 DIAGNOSIS — F4329 Adjustment disorder with other symptoms: Secondary | ICD-10-CM | POA: Insufficient documentation

## 2012-05-10 DIAGNOSIS — I5033 Acute on chronic diastolic (congestive) heart failure: Secondary | ICD-10-CM

## 2012-05-10 DIAGNOSIS — F4381 Prolonged grief disorder: Secondary | ICD-10-CM

## 2012-05-10 DIAGNOSIS — F0150 Vascular dementia without behavioral disturbance: Secondary | ICD-10-CM

## 2012-05-10 DIAGNOSIS — F4321 Adjustment disorder with depressed mood: Secondary | ICD-10-CM

## 2012-05-10 LAB — BASIC METABOLIC PANEL
Calcium: 8.7 mg/dL (ref 8.4–10.5)
GFR: 34.57 mL/min — ABNORMAL LOW (ref >60.00)
Glucose, Bld: 121 mg/dL — ABNORMAL HIGH (ref 70–99)
Potassium: 3.6 mEq/L (ref 3.5–5.1)
Sodium: 135 mEq/L (ref 135–145)

## 2012-05-10 MED ORDER — SIMVASTATIN 20 MG PO TABS: 20.0000 mg | ORAL_TABLET | Freq: Every evening | ORAL | Status: DC

## 2012-05-10 MED ORDER — FUROSEMIDE 80 MG PO TABS: 80.0000 mg | ORAL_TABLET | Freq: Two times a day (BID) | ORAL | Status: DC

## 2012-05-10 NOTE — Assessment & Plan Note (Signed)
Now with some visual hallucinations Clearly his mood is worsening his overall health Will refer for grief counseling

## 2012-05-10 NOTE — Progress Notes (Signed)
Subjective:    Patient ID: [name], male    DOB: May 08, 1931, 79 y.o.   MRN: [id number]  HPI Here with wife  Has had fluid build up for 2-3 weeks Did increase the diuretic to 80mg  daily for 2 days Did diurese but weight is only down 1#  Has follow up with nephrologist Dr Cherylann Ratel next month  Breathing has been worse Has to rest when he is walking Slight chest pain this AM---took a nitro and it resolved Has had some sleep problems--- PND and then he is up  Current Outpatient Prescriptions on File Prior to Visit  Medication Sig Dispense Refill  . Cholecalciferol (VITAMIN D) 2000 UNITS CAPS Take 1 capsule by mouth daily.      Marland Kitchen COLCRYS 0.6 MG tablet TAKE ONE TABLET BY MOUTH TWICE DAILY  60 tablet  1  . cyclobenzaprine (FLEXERIL) 10 MG tablet Take 1 tablet (10 mg total) by mouth 3 (three) times daily as needed for muscle spasms.  30 tablet  0  . doxazosin (CARDURA) 8 MG tablet Take 1 tablet (8 mg total) by mouth at bedtime.  30 tablet  6  . fluticasone (FLONASE) 50 MCG/ACT nasal spray 2 sprays by Nasal route as needed.        . furosemide (LASIX) 40 MG tablet TAKE 1 TABLET (40 MG TOTAL) BY MOUTH DAILY AS NEEDED.  30 tablet  0  . glipiZIDE (GLUCOTROL XL) 10 MG 24 hr tablet Take 1 tablet (10 mg total) by mouth daily.  30 tablet  5  . glucose blood (BAYER CONTOUR TEST) test strip Use as instructed, check blood sugar three times daily       . hydrALAZINE (APRESOLINE) 100 MG tablet Take 1 tablet (100 mg total) by mouth 3 (three) times daily.  90 tablet  6  . insulin detemir (LEVEMIR FLEXPEN) 100 UNIT/ML injection Inject 12 Units into the skin daily.  9 mL  12  . Insulin Pen Needle (PEN NEEDLES 5/16") 30G X 8 MM MISC Inject 20 Units into the skin as directed. As directed.  100 each  6  . isosorbide mononitrate (IMDUR) 60 MG 24 hr tablet Take 1 tablet (60 mg total) by mouth 2 (two) times daily.  60 tablet  6  . losartan (COZAAR) 50 MG tablet Take 50 mg by mouth daily.      . metoprolol  tartrate (LOPRESSOR) 25 MG tablet Take 1 tablet (25 mg total) by mouth 2 (two) times daily.  60 tablet  6  . Multiple Vitamin (MULTIVITAMIN) tablet Take 1 tablet by mouth daily.        . nitroGLYCERIN (NITROSTAT) 0.4 MG SL tablet Place 1 tablet (0.4 mg total) under the tongue every 5 (five) minutes as needed.  30 tablet  3  . omeprazole (PRILOSEC) 20 MG capsule Take 20 mg by mouth daily.        . polyethylene glycol (MIRALAX / GLYCOLAX) packet Take 17 g by mouth daily.      . Tamsulosin HCl (FLOMAX) 0.4 MG CAPS Take 0.4 mg by mouth.        . traZODone (DESYREL) 50 MG tablet Take 1-2 tablets (50-100 mg total) by mouth at bedtime.  60 tablet  1  . warfarin (COUMADIN) 5 MG tablet TAKE 1+1/2 TABLETS DAILY OR AS DIRECTED  60 tablet  3   No current facility-administered medications on file prior to visit.    Allergies  Allergen Reactions  . Buspirone Hcl     REACTION:  unknown  . Sulfonamide Derivatives     REACTION: unspecified    Past Medical History  Diagnosis Date  . Allergy   . Anemia   . GERD (gastroesophageal reflux disease)   . Hypertension   . Osteoarthritis   . Hyperlipidemia   . BPH (benign prostatic hyperplasia)     Dr. Achilles Dunk  . Diastolic CHF, acute   . S/P ablation of atrial fibrillation   . Anxiety   . 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 Diamond Nickel  . Allergic rhinitis   . CAD (coronary artery disease)     Dr. Diamond Nickel  . Diabetes mellitus type II   . Atrial flutter 07/1999  . Atrial tachycardia 03/2000  . Chest pain 03/2000  . Vascular dementia     Past Surgical History  Procedure Laterality Date  . Angioplasty      multiple, and stents  . Coronary artery bypass graft  01/2000  . Circumcision  12/2005  . Coronary stent placement  07/2003    RCA  . Circumcision  12/2005    Family History  Problem Relation Age of Onset  . Diabetes Mother   . Kidney disease Mother     ESRD  . Heart disease Father     CVA  . Stroke Father 82    deceased from same   . Hypertension Sister   . Coronary artery disease Sister   . Depression Daughter     History   Social History  . Marital Status: Married    Spouse Name: N/A    Number of Children: 4  . Years of Education: N/A   Occupational History  . retired     disabled due to CAD   Social History Main Topics  . Smoking status: Never Smoker   . Smokeless tobacco: Former Neurosurgeon    Types: Chew    Quit date: 01/30/1985  . Alcohol Use: No     Comment: Quit 30 years ago  . Drug Use: No  . Sexually Active: Not on file   Other Topics Concern  . Not on file   Social History Narrative   Married with 4 children         Review of Systems Still grieving for daughter---will awaken crying Memory is considerably worse Now seeing people in the house who aren't there    Objective:   Physical Exam  Constitutional: He appears well-developed. No distress.  Neck: Normal range of motion. No thyromegaly present.  Cardiovascular: Normal rate, regular rhythm and normal heart sounds.  Exam reveals no gallop.   No murmur heard. Pulmonary/Chest: Effort normal and breath sounds normal. No respiratory distress. He has no wheezes. He has no rales.  No sig dullness   Musculoskeletal: He exhibits edema.  3+ tense edema bilaterally to knees  Lymphadenopathy:    He has no cervical adenopathy.  Psychiatric:  Depressed Normal interaction          Assessment & Plan:

## 2012-05-10 NOTE — Patient Instructions (Signed)
Please call [facility] at [phone] to speak to a grief counselor.

## 2012-05-10 NOTE — Assessment & Plan Note (Signed)
Has not diuresed Edema still bad Increased DOE and PND  Will increase the furosemide to 80 bid May need spironolactone but high risk for renal worsening and hyperkalemia Check met b today

## 2012-05-10 NOTE — Assessment & Plan Note (Signed)
Recent worsening in memory probably pseudodementia No evidence of new stroke

## 2012-05-12 ENCOUNTER — Other Ambulatory Visit: Payer: Self-pay | Admitting: Cardiovascular Disease

## 2012-05-15 ENCOUNTER — Other Ambulatory Visit: Payer: Self-pay

## 2012-05-15 MED ORDER — POTASSIUM CHLORIDE ER 10 MEQ PO TBCR: 10.0000 meq | EXTENDED_RELEASE_TABLET | Freq: Two times a day (BID) | ORAL | Status: AC

## 2012-05-15 MED ORDER — TORSEMIDE 20 MG PO TABS: 40.0000 mg | ORAL_TABLET | Freq: Two times a day (BID) | ORAL | Status: DC

## 2012-05-16 ENCOUNTER — Ambulatory Visit (INDEPENDENT_AMBULATORY_CARE_PROVIDER_SITE_OTHER): Payer: No Typology Code available for payment source | Admitting: General Practice

## 2012-05-16 DIAGNOSIS — I4891 Unspecified atrial fibrillation: Secondary | ICD-10-CM

## 2012-05-16 LAB — POCT INR: INR: 2.6

## 2012-05-16 NOTE — Patient Instructions (Signed)
Continue to take 7. 5 mg all days except 5 mg on Mon, Wed and Friday.   Re-check in 2 weeks.  Orders given to [place], RN @ [facility]

## 2012-05-20 ENCOUNTER — Ambulatory Visit (INDEPENDENT_AMBULATORY_CARE_PROVIDER_SITE_OTHER): Payer: No Typology Code available for payment source | Admitting: Internal Medicine

## 2012-05-20 ENCOUNTER — Encounter: Payer: Self-pay | Admitting: Internal Medicine

## 2012-05-20 ENCOUNTER — Telehealth: Payer: Self-pay

## 2012-05-20 VITALS — BP 146/72 | HR 85 | Temp 98.0°F | Wt 229.5 lb

## 2012-05-20 DIAGNOSIS — F4329 Adjustment disorder with other symptoms: Secondary | ICD-10-CM

## 2012-05-20 DIAGNOSIS — F4321 Adjustment disorder with depressed mood: Secondary | ICD-10-CM

## 2012-05-20 DIAGNOSIS — I5033 Acute on chronic diastolic (congestive) heart failure: Secondary | ICD-10-CM

## 2012-05-20 NOTE — Telephone Encounter (Signed)
assess 

## 2012-05-20 NOTE — Telephone Encounter (Signed)
No answer

## 2012-05-20 NOTE — Progress Notes (Signed)
Subjective:    Patient ID: [name], male    DOB: [birth year], 79 y.o.   MRN: [id number]  HPI Here with wife  Reviewed echo report  Furosemide changed to torsemide Now on potassium supplements also Has lost 5# since my last visit  Still having breathing problems--may be some worse though Needed nitro x 1 for chest discomfort--before church on Easter Short of breath even with limited walking  Saw hospice counselor-- Ruta Hinds--- last week That was very helpful for both of them Have return appt next week  Current Outpatient Prescriptions on File Prior to Visit  Medication Sig Dispense Refill  . Cholecalciferol (VITAMIN D) 2000 UNITS CAPS Take 1 capsule by mouth daily.      Marland Kitchen COLCRYS 0.6 MG tablet TAKE ONE TABLET BY MOUTH TWICE DAILY  60 tablet  1  . cyclobenzaprine (FLEXERIL) 10 MG tablet TAKE ONE TABLET BY MOUTH THREE TIMES A DAY FOR MUSCLE SPASMS  30 tablet  0  . doxazosin (CARDURA) 8 MG tablet Take 1 tablet (8 mg total) by mouth at bedtime.  30 tablet  6  . fluticasone (FLONASE) 50 MCG/ACT nasal spray 2 sprays by Nasal route as needed.        Marland Kitchen glipiZIDE (GLUCOTROL XL) 10 MG 24 hr tablet Take 1 tablet (10 mg total) by mouth daily.  30 tablet  5  . glucose blood (BAYER CONTOUR TEST) test strip Use as instructed, check blood sugar three times daily       . hydrALAZINE (APRESOLINE) 100 MG tablet Take 1 tablet (100 mg total) by mouth 3 (three) times daily.  90 tablet  6  . insulin detemir (LEVEMIR FLEXPEN) 100 UNIT/ML injection Inject 12 Units into the skin daily.  9 mL  12  . Insulin Pen Needle (PEN NEEDLES 5/16") 30G X 8 MM MISC Inject 20 Units into the skin as directed. As directed.  100 each  6  . isosorbide mononitrate (IMDUR) 60 MG 24 hr tablet Take 1 tablet (60 mg total) by mouth 2 (two) times daily.  60 tablet  6  . losartan (COZAAR) 50 MG tablet Take 50 mg by mouth daily.      . metoprolol tartrate (LOPRESSOR) 25 MG tablet Take 1 tablet (25 mg total) by mouth 2 (two)  times daily.  60 tablet  6  . Multiple Vitamin (MULTIVITAMIN) tablet Take 1 tablet by mouth daily.        . nitroGLYCERIN (NITROSTAT) 0.4 MG SL tablet Place 1 tablet (0.4 mg total) under the tongue every 5 (five) minutes as needed.  30 tablet  3  . omeprazole (PRILOSEC) 20 MG capsule Take 20 mg by mouth daily.        . polyethylene glycol (MIRALAX / GLYCOLAX) packet Take 17 g by mouth daily.      . potassium chloride (K-DUR) 10 MEQ tablet Take 1 tablet (10 mEq total) by mouth 2 (two) times daily.  60 tablet  3  . simvastatin (ZOCOR) 20 MG tablet Take 1 tablet (20 mg total) by mouth every evening.  30 tablet  11  . Tamsulosin HCl (FLOMAX) 0.4 MG CAPS Take 0.4 mg by mouth.        . torsemide (DEMADEX) 20 MG tablet Take 2 tablets (40 mg total) by mouth 2 (two) times daily.  120 tablet  3  . traZODone (DESYREL) 50 MG tablet Take 1-2 tablets (50-100 mg total) by mouth at bedtime.  60 tablet  1  . warfarin (COUMADIN) 5  MG tablet TAKE 1+1/2 TABLETS DAILY OR AS DIRECTED  60 tablet  3   No current facility-administered medications on file prior to visit.    Allergies  Allergen Reactions  . Buspirone Hcl     REACTION: unknown  . Sulfonamide Derivatives     REACTION: unspecified    Past Medical History  Diagnosis Date  . Allergy   . Anemia   . GERD (gastroesophageal reflux disease)   . Hypertension   . Osteoarthritis   . Hyperlipidemia   . BPH (benign prostatic hyperplasia)     Dr. Achilles Dunk  . Diastolic CHF, acute   . S/P ablation of atrial fibrillation   . Anxiety   . 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 Diamond Nickel  . Allergic rhinitis   . CAD (coronary artery disease)     Dr. Diamond Nickel  . Diabetes mellitus type II   . Atrial flutter 07/1999  . Atrial tachycardia 03/2000  . Chest pain 03/2000  . Vascular dementia     Past Surgical History  Procedure Laterality Date  . Angioplasty      multiple, and stents  . Coronary artery bypass graft  01/2000  . Circumcision  12/2005  .  Coronary stent placement  07/2003    RCA  . Circumcision  12/2005    Family History  Problem Relation Age of Onset  . Diabetes Mother   . Kidney disease Mother     ESRD  . Heart disease Father     CVA  . Stroke Father 42    deceased from same  . Hypertension Sister   . Coronary artery disease Sister   . Depression Daughter     History   Social History  . Marital Status: Married    Spouse Name: N/A    Number of Children: 4  . Years of Education: N/A   Occupational History  . retired     disabled due to CAD   Social History Main Topics  . Smoking status: Never Smoker   . Smokeless tobacco: Former Neurosurgeon    Types: Chew    Quit date: 01/30/1985  . Alcohol Use: No     Comment: Quit 30 years ago  . Drug Use: No  . Sexually Active: Not on file   Other Topics Concern  . Not on file   Social History Narrative   Married with 4 children         Review of Systems Appetite isn't so good  Not sleeping well still    Objective:   Physical Exam  Constitutional: He appears well-developed. No distress.  Neck: Normal range of motion. Neck supple.  Cardiovascular: Normal rate, regular rhythm and normal heart sounds.  Exam reveals no gallop.   No murmur heard. Pulmonary/Chest: Effort normal. No respiratory distress. He has no wheezes. He has no rales.  Dullness and decreased breath sounds at right base  Musculoskeletal: He exhibits edema. He exhibits no tenderness.  4+ reedy edema (non pitting) up to knees still  Lymphadenopathy:    He has no cervical adenopathy.  Psychiatric:  Still down with constricted affect          Assessment & Plan:

## 2012-05-20 NOTE — Assessment & Plan Note (Addendum)
Echo reviewed Changed to torsemide by Dr Mariah Milling but still limited effect Would only give 1 more week before trying spironolactone (would then stop the potassium) Evidence for spironolactone is not clear in diastolic failure---but his symptoms are clearly from fluid and he needs to be diuresed

## 2012-05-20 NOTE — Assessment & Plan Note (Signed)
Has connected with grief counselor and will continue with him

## 2012-05-22 ENCOUNTER — Telehealth: Payer: Self-pay | Admitting: *Deleted

## 2012-05-22 NOTE — Telephone Encounter (Signed)
See telephone note from 4/21

## 2012-05-22 NOTE — Telephone Encounter (Signed)
Pt wife states pt weight still up "10 pounds" since changing to torsemide 40 mg BID Saw Dr. Alphonsus Sias Monday 4/21 and no meds were changed Says pt is drinking "plenty of water"  Also tells me pt has been having CP, left sided and "all across chest" since Monday, took NTG SL x 1 4/21, NTG SL x 1 4/22, NTG SL x 1 this am Describes pain as "twinge" when he moves "real fast" Was given muscle relaxer by PCP and this does not seem to help Asks if pt can be seen today by Dr. Mariah Milling I had her hold while I discussed with Dr. Mariah Milling, who says pt should restrict fluid intake, continue torsemide 40 mg BID and feel chest pain is non-cardiac, suggests pt f/u with [place] 4/25 with weights and report.  I explained this to wife who says she weighed him this am while I was on phone; weight was 222 pounds (was 229 pounds 4/21 at Dr. Karle Starch office); will continue taking torsemide as prescribed We will call them back 4/25 and reassess I advised wife to call us sooner should they get really concerned about the CP and it is not relieved by SL NTG Understanding verb

## 2012-05-22 NOTE — Telephone Encounter (Signed)
Pt wife called stating that pt is having chest pain and has taken nitro.

## 2012-05-24 ENCOUNTER — Ambulatory Visit (INDEPENDENT_AMBULATORY_CARE_PROVIDER_SITE_OTHER): Payer: No Typology Code available for payment source | Admitting: General Practice

## 2012-05-24 DIAGNOSIS — I4891 Unspecified atrial fibrillation: Secondary | ICD-10-CM

## 2012-05-24 LAB — POCT INR: INR: 1.9

## 2012-05-24 NOTE — Patient Instructions (Signed)
Continue to take 7. 5 mg all days except 5 mg on Mon, Wed and Friday.   Re-check in 2 weeks.  Orders given to [place], RN @ [facility] [phone]

## 2012-05-27 ENCOUNTER — Telehealth: Payer: Self-pay

## 2012-05-27 NOTE — Telephone Encounter (Signed)
Pt's wife is calling to say pt still having a "burning" sensation on left side of chest Has taken NTG x 1 daily for this discomfort Has not taken any today Admits to pain being worse "when doing something" Taking muscle relaxers as prescribed by PCP with no relief  I discussed this with Dr. Mariah Milling who advises him to hold simvastatin since it may be r/t statin and make appt to be seen this week  I explained this to wife appt made with Alinda Money, [place] 4/30 at 1100 Wife understands to have pt hold zocor in mean time She also understands to call 911 should pt's symptoms get worse/go unrelieved with NTG

## 2012-05-29 ENCOUNTER — Ambulatory Visit (INDEPENDENT_AMBULATORY_CARE_PROVIDER_SITE_OTHER): Payer: No Typology Code available for payment source | Admitting: Internal Medicine

## 2012-05-29 ENCOUNTER — Encounter: Payer: Self-pay | Admitting: Physician Assistant

## 2012-05-29 ENCOUNTER — Encounter: Payer: Self-pay | Admitting: Internal Medicine

## 2012-05-29 ENCOUNTER — Ambulatory Visit (INDEPENDENT_AMBULATORY_CARE_PROVIDER_SITE_OTHER): Payer: No Typology Code available for payment source | Admitting: Physician Assistant

## 2012-05-29 VITALS — BP 120/60 | HR 86 | Temp 98.2°F | Wt 222.0 lb

## 2012-05-29 VITALS — BP 122/70 | HR 76 | Ht 69.0 in | Wt 228.2 lb

## 2012-05-29 DIAGNOSIS — I5043 Acute on chronic combined systolic (congestive) and diastolic (congestive) heart failure: Secondary | ICD-10-CM

## 2012-05-29 DIAGNOSIS — I25810 Atherosclerosis of coronary artery bypass graft(s) without angina pectoris: Secondary | ICD-10-CM

## 2012-05-29 DIAGNOSIS — N183 Chronic kidney disease, stage 3 unspecified: Secondary | ICD-10-CM

## 2012-05-29 DIAGNOSIS — E119 Type 2 diabetes mellitus without complications: Secondary | ICD-10-CM

## 2012-05-29 DIAGNOSIS — L98499 Non-pressure chronic ulcer of skin of other sites with unspecified severity: Secondary | ICD-10-CM

## 2012-05-29 DIAGNOSIS — I480 Paroxysmal atrial fibrillation: Secondary | ICD-10-CM | POA: Insufficient documentation

## 2012-05-29 DIAGNOSIS — I509 Heart failure, unspecified: Secondary | ICD-10-CM

## 2012-05-29 DIAGNOSIS — E7800 Pure hypercholesterolemia, unspecified: Secondary | ICD-10-CM

## 2012-05-29 DIAGNOSIS — F4329 Adjustment disorder with other symptoms: Secondary | ICD-10-CM

## 2012-05-29 DIAGNOSIS — L98411 Non-pressure chronic ulcer of buttock limited to breakdown of skin: Secondary | ICD-10-CM

## 2012-05-29 DIAGNOSIS — L98419 Non-pressure chronic ulcer of buttock with unspecified severity: Secondary | ICD-10-CM | POA: Insufficient documentation

## 2012-05-29 DIAGNOSIS — N179 Acute kidney failure, unspecified: Secondary | ICD-10-CM

## 2012-05-29 DIAGNOSIS — I4891 Unspecified atrial fibrillation: Secondary | ICD-10-CM

## 2012-05-29 DIAGNOSIS — I10 Essential (primary) hypertension: Secondary | ICD-10-CM

## 2012-05-29 DIAGNOSIS — I5033 Acute on chronic diastolic (congestive) heart failure: Secondary | ICD-10-CM

## 2012-05-29 DIAGNOSIS — F4321 Adjustment disorder with depressed mood: Secondary | ICD-10-CM

## 2012-05-29 LAB — BASIC METABOLIC PANEL
BUN: 64 mg/dL — ABNORMAL HIGH (ref 6–23)
GFR: 22.42 mL/min — ABNORMAL LOW (ref >60.00)
Potassium: 4.1 mEq/L (ref 3.5–5.1)

## 2012-05-29 MED ORDER — ASPIRIN EC 81 MG PO TBEC: 81.0000 mg | DELAYED_RELEASE_TABLET | Freq: Every day | ORAL | Status: AC

## 2012-05-29 MED ORDER — METOLAZONE 5 MG PO TABS: ORAL_TABLET | ORAL | Status: DC

## 2012-05-29 MED ORDER — TRAZODONE HCL 50 MG PO TABS: 50.0000 mg | ORAL_TABLET | Freq: Every day | ORAL | Status: AC

## 2012-05-29 NOTE — Assessment & Plan Note (Signed)
Weight is down but symptoms worse Clearly less edema but worsened DOE Will continue current diuretics and check met B

## 2012-05-29 NOTE — Assessment & Plan Note (Signed)
Seeing [name] for counseling again tomorrow Having ongoing sleep problems All this affecting his heart Will restart the trazodone

## 2012-05-29 NOTE — Assessment & Plan Note (Signed)
122/70 in the office today. Continue antihypertensives.

## 2012-05-29 NOTE — Assessment & Plan Note (Signed)
Will try end-it or zinc oxide Should heal on its own

## 2012-05-29 NOTE — Assessment & Plan Note (Signed)
Continue statin. 

## 2012-05-29 NOTE — Assessment & Plan Note (Signed)
Maintaining NSR on office EKG. Does endorse occasional palpitations, which have unchanged in frequency or severity. INR 1.9 on 05/24/12 Coumadin visit. Continue BB for rate-control. Will need to continue to monitor to ensure CHF does not potentiate a-fib with RVR.

## 2012-05-29 NOTE — Assessment & Plan Note (Signed)
Patient's episodic chest discomfort is difficult to isolate to one primary cause. There are features consistent with reflux/GI, musculoskeletal and cardiac ischemic etiologies. Supporting the latter are exertional aggravation, NTG responsiveness and new changes on the patient's EKG in the office today. This may represent primary ischemia or volume overload/stress changes from decompensated CHF. Unclear candidacy for repeat invasive ischemic eval +/- intervention, particularly in light of his recent acute on CKD. Will plan to improve volume status and stabilize renal function prior to addressing ischemia. As noted, there are additional factors which are likely attributed to his volume overload. Add low-dose ASA. Hold ARB. Continue BB, Imdur/hydralazine, statin, NTG SL PRN.

## 2012-05-29 NOTE — Progress Notes (Signed)
Patient ID: [name], male    DOB: [birth year], 79 y.o.   MRN: [id number]  HPI Comments: 79 yo AAM with history of CAD s/p CABG in 2000-2001 and stent in [place] in 2007, HTN, DM, PAF (on chronic Coumadin anticoagulation), chronic combined CHF, hyperlipidemia, CKD (stage III-IV, Cr 2.4 on 05/10/12), known PVD with reduced ABI in the past who presents today for routine followup.  He was seen twice in the past two months for worsening edema and weight gain. Amlodipine was held and blood pressure medications changed. He has been adherent to sodium restriction, but there has been confusion as to fluid restriction.   Recent hospital admission for right-sided chest pain. He was felt to be atypical in nature. Cardiac enzymes negative. Amiodarone was held and he was restarted on a statin, Zocor 20 mg daily. Creatinine in the hospital was 1.84 on March 9, normal LFTs with ALT 25, AST 39, creatinine 1.6 on March 10, total cholesterol 177, LDL 126  He has had significant stress at home with the death of his daughter and his brother in the last month.  Stress test May 2012 shows no ischemia Lower extremity ultrasound in 2012 showing greater than 50% left SFA disease 2-D echo 05/01/12: EF 40-45%, mild concentric LVH, grade 2 diastolic dysfunction, moderate MR, mild biatrial enlargement, PASD 49 mmHg, dilated IVC c/w severely elevated CVP.   Shortly after undergoing a 2-D echocardiogram noted above, the patient complained of increased weight gain, worsening dyspnea on exertion and lower extremity edema. Dr. Mariah Milling replaced Lasix 80 twice a day with torsemide 40 twice a day. He is advised to monitor daily weights, elevate his legs and restrict sodium and fluid intake. He is scheduled for followup in the office for which he presents today.  He continues to experience the symptoms in addition to increased abdominal distention, severe PND and frequent episodes of chest discomfort. He describes 3 different  circumstances in which substernal chest discomfort. He does experience chest pain after meals which changes in quality upon belching. He also notes chest discomfort which is worse on palpation in position changes. Finally, he does endorse exertional chest discomfort relieved with rest and nitroglycerin. The quality and description of these episodes are all described as left sided "burning" lasting 20-30 minutes rated at a 9/10. Denies radiation or associated symptoms. He followed up with his PCP Dr. Alphonsus Sias earlier today with similar complaints. A BMET was ordered which revealed BUN 64/Cr 3.4. INR 1.9 on 05/24/12 (managed by PCP). He does continue to make urine. His relative who has accompanied him today tells me he has an appointment with Dr. Cherylann Ratel next month. He denies decreased urinary flow or dribbling. He continues to monitor daily weights. Prior office weight 212 lbs-> home to 220-222 lbs-> office weight today 223 lbs. He continues to restrict salt intake. He moderate to fluid intake, but not consistently. He also does not consistently wear compression stockings. He is conscientious of elevating his legs whenever at rest. He takes all medications as prescribed. No EtOH or illicit drug use.   Family, social and past medical history reviewed- reviewed and noted on History in chart. Past medical history as above and noted in history.   EKG shows sinus rhythm with rate 76 beats per minute with more pronounced ST abnormality in the anterolateral leads, and new downsloping ST depressions with associated T-wave inversions I, aVL          Outpatient Encounter Prescriptions as of 04/19/2012  Medication Sig Dispense  Refill  . Cholecalciferol (VITAMIN D) 2000 UNITS CAPS Take 1 capsule by mouth daily.      Marland Kitchen COLCRYS 0.6 MG tablet TAKE ONE TABLET BY MOUTH TWICE DAILY  60 tablet  1  . fluticasone (FLONASE) 50 MCG/ACT nasal spray 2 sprays by Nasal route as needed.        . furosemide (LASIX) 40 MG tablet Take  1 tablet (40 mg total) by mouth daily as needed.  30 tablet  1  . glipiZIDE (GLUCOTROL XL) 10 MG 24 hr tablet Take 1 tablet (10 mg total) by mouth daily.  30 tablet  5  . glucose blood (BAYER CONTOUR TEST) test strip Use as instructed, check blood sugar three times daily       . hydrALAZINE (APRESOLINE) 100 MG tablet Take 1 tablet (100 mg total) by mouth 3 (three) times daily.  90 tablet  6  . insulin detemir (LEVEMIR FLEXPEN) 100 UNIT/ML injection Inject 12 Units into the skin daily.  9 mL  12  . Insulin Pen Needle (PEN NEEDLES 5/16") 30G X 8 MM MISC Inject 20 Units into the skin as directed. As directed.  100 each  6  . isosorbide mononitrate (IMDUR) 60 MG 24 hr tablet Take 1 tablet (60 mg total) by mouth 2 (two) times daily.  60 tablet  6  . losartan (COZAAR) 50 MG tablet Take 50 mg by mouth daily.      . Multiple Vitamin (MULTIVITAMIN) tablet Take 1 tablet by mouth daily.        . nitroGLYCERIN (NITROSTAT) 0.4 MG SL tablet Place 1 tablet (0.4 mg total) under the tongue every 5 (five) minutes as needed.  30 tablet  3  . omeprazole (PRILOSEC) 20 MG capsule Take 20 mg by mouth daily.        . polyethylene glycol (MIRALAX / GLYCOLAX) packet Take 17 g by mouth daily.      . simvastatin (ZOCOR) 20 MG tablet Take 20 mg by mouth every evening.      . Tamsulosin HCl (FLOMAX) 0.4 MG CAPS Take 0.4 mg by mouth.        . traZODone (DESYREL) 50 MG tablet Take 1-2 tablets (50-100 mg total) by mouth at bedtime.  60 tablet  1  . warfarin (COUMADIN) 5 MG tablet TAKE 1+1/2 TABLETS DAILY OR AS DIRECTED  60 tablet  3  . cyclobenzaprine (FLEXERIL) 10 MG tablet Take 1 tablet (10 mg total) by mouth 3 (three) times daily as needed for muscle spasms.  30 tablet  0  . [DISCONTINUED] ALPRAZolam (XANAX) 0.25 MG tablet Take 1-2 tablets (0.25-0.5 mg total) by mouth 3 (three) times daily as needed.  60 tablet  0   No facility-administered encounter medications on file as of 04/19/2012.    Review of Systems   Constitutional: Negative.   HENT: Negative.   Eyes: Negative.   Respiratory: Positive for chest tightness and shortness of breath.   Cardiovascular: Positive for chest pain, palpitations and leg swelling.       Substernal chest wall pain on palpation.   Gastrointestinal: Positive for abdominal distention.  Musculoskeletal: Positive for gait problem.  Skin: Negative.   Neurological: Negative.   Hematological: Negative.   Psychiatric/Behavioral: Negative.   All other systems reviewed and are negative.   BP 122/70  Pulse 76  Ht 5\' 9"  (1.753 m)  Wt 228 lb 4 oz (103.534 kg)  BMI 33.69 kg/m2  Physical Exam  Nursing note and vitals reviewed. Constitutional: He is  oriented to person, place, and time. He appears well-developed and well-nourished.  HENT:  Head: Normocephalic.  Nose: Nose normal.  Mouth/Throat: Oropharynx is clear and moist.  Eyes: Conjunctivae are normal. Pupils are equal, round, and reactive to light.  Neck: Normal range of motion. Neck supple. JVD present.  JVP 6-7 cm  Cardiovascular: Normal rate, regular rhythm, S1 normal, S2 normal and intact distal pulses.  Exam reveals no gallop and no friction rub.   Murmur heard.  Systolic murmur is present with a grade of 2/6  1-2+ pitting edema to below the knees bilaterally, right worse on left  Pulmonary/Chest: Effort normal and breath sounds normal. No respiratory distress. He has no wheezes. He has no rales. He exhibits no tenderness.  Abdominal: Soft. Bowel sounds are normal. He exhibits distension. There is no tenderness.  Musculoskeletal: Normal range of motion. He exhibits edema. He exhibits no tenderness.  Lymphadenopathy:    He has no cervical adenopathy.  Neurological: He is alert and oriented to person, place, and time. Coordination normal.  Skin: Skin is warm and dry. No rash noted. No erythema.  Psychiatric: He has a normal mood and affect. His behavior is normal. Judgment and thought content normal.       Assessment and Plan

## 2012-05-29 NOTE — Assessment & Plan Note (Addendum)
Cr has jumped from 2.4 to 3.4 in the matter of two weeks. This has been in the setting of diuretic adjustments. He does have CKD, stage III at baseline. Have advised to discontinue ARB, colchicine. Have increase diuretic regimen in an effort to promote diuresis. Will recheck his renal function and electrolytes on Monday. If significantly worse, he may have to be admitted for more advanced evaluation, management and monitoring. In the meantime, have moved his appointment with Dr. Cherylann Ratel to 06/06/12.

## 2012-05-29 NOTE — Patient Instructions (Addendum)
Please restart the trazodone to help your sleep. Please get End-it cream or thick zinc oxide and apply regularly to the open buttock area.

## 2012-05-29 NOTE — Progress Notes (Signed)
Subjective:    Patient ID: [name], male    DOB: [birth year], 79 y.o.   MRN: [id number]  HPI Here with wife Has lost 9# on his scale in the past 8 days (7.5# on our scale)  Has had some chest pain Still has easy DOE---may actually have worsened despite weight loss Has to stop and sit to catch breath just walking around his house  Awakens 3AM every morning---then can't get back to sleep May fall asleep on and off in day--- "seeing someone" at times Has woken up talking about dead family members  New sore on buttocks and crack of butt Neosporin may be helping some  Current Outpatient Prescriptions on File Prior to Visit  Medication Sig Dispense Refill  . Cholecalciferol (VITAMIN D) 2000 UNITS CAPS Take 1 capsule by mouth daily.      Marland Kitchen COLCRYS 0.6 MG tablet TAKE ONE TABLET BY MOUTH TWICE DAILY  60 tablet  1  . cyclobenzaprine (FLEXERIL) 10 MG tablet TAKE ONE TABLET BY MOUTH THREE TIMES A DAY FOR MUSCLE SPASMS  30 tablet  0  . doxazosin (CARDURA) 8 MG tablet Take 1 tablet (8 mg total) by mouth at bedtime.  30 tablet  6  . fluticasone (FLONASE) 50 MCG/ACT nasal spray 2 sprays by Nasal route as needed.        Marland Kitchen glipiZIDE (GLUCOTROL XL) 10 MG 24 hr tablet Take 1 tablet (10 mg total) by mouth daily.  30 tablet  5  . glucose blood (BAYER CONTOUR TEST) test strip Use as instructed, check blood sugar three times daily       . hydrALAZINE (APRESOLINE) 100 MG tablet Take 1 tablet (100 mg total) by mouth 3 (three) times daily.  90 tablet  6  . insulin detemir (LEVEMIR FLEXPEN) 100 UNIT/ML injection Inject 12 Units into the skin daily.  9 mL  12  . Insulin Pen Needle (PEN NEEDLES 5/16") 30G X 8 MM MISC Inject 20 Units into the skin as directed. As directed.  100 each  6  . isosorbide mononitrate (IMDUR) 60 MG 24 hr tablet Take 1 tablet (60 mg total) by mouth 2 (two) times daily.  60 tablet  6  . losartan (COZAAR) 50 MG tablet Take 50 mg by mouth daily.      . metoprolol tartrate (LOPRESSOR)  25 MG tablet Take 1 tablet (25 mg total) by mouth 2 (two) times daily.  60 tablet  6  . Multiple Vitamin (MULTIVITAMIN) tablet Take 1 tablet by mouth daily.        . nitroGLYCERIN (NITROSTAT) 0.4 MG SL tablet Place 1 tablet (0.4 mg total) under the tongue every 5 (five) minutes as needed.  30 tablet  3  . omeprazole (PRILOSEC) 20 MG capsule Take 20 mg by mouth daily.        . polyethylene glycol (MIRALAX / GLYCOLAX) packet Take 17 g by mouth daily.      . potassium chloride (K-DUR) 10 MEQ tablet Take 1 tablet (10 mEq total) by mouth 2 (two) times daily.  60 tablet  3  . Tamsulosin HCl (FLOMAX) 0.4 MG CAPS Take 0.4 mg by mouth.        . torsemide (DEMADEX) 20 MG tablet Take 2 tablets (40 mg total) by mouth 2 (two) times daily.  120 tablet  3  . traZODone (DESYREL) 50 MG tablet Take 1-2 tablets (50-100 mg total) by mouth at bedtime.  60 tablet  1  . warfarin (COUMADIN) 5 MG tablet  TAKE 1+1/2 TABLETS DAILY OR AS DIRECTED  60 tablet  3   No current facility-administered medications on file prior to visit.    Allergies  Allergen Reactions  . Buspirone Hcl     REACTION: unknown  . Sulfonamide Derivatives     REACTION: unspecified    Past Medical History  Diagnosis Date  . Allergy   . Anemia   . GERD (gastroesophageal reflux disease)   . Hypertension   . Osteoarthritis   . Hyperlipidemia   . BPH (benign prostatic hyperplasia)     Dr. Achilles Dunk  . Diastolic CHF, acute   . S/P ablation of atrial fibrillation   . Anxiety   . 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 Diamond Nickel  . Allergic rhinitis   . CAD (coronary artery disease)     Dr. Diamond Nickel  . Diabetes mellitus type II   . Atrial flutter 07/1999  . Atrial tachycardia 03/2000  . Chest pain 03/2000  . Vascular dementia     Past Surgical History  Procedure Laterality Date  . Angioplasty      multiple, and stents  . Coronary artery bypass graft  01/2000  . Circumcision  12/2005  . Coronary stent placement  07/2003    RCA  .  Circumcision  12/2005    Family History  Problem Relation Age of Onset  . Diabetes Mother   . Kidney disease Mother     ESRD  . Heart disease Father     CVA  . Stroke Father 74    deceased from same  . Hypertension Sister   . Coronary artery disease Sister   . Depression Daughter     History   Social History  . Marital Status: Married    Spouse Name: N/A    Number of Children: 4  . Years of Education: N/A   Occupational History  . retired     disabled due to CAD   Social History Main Topics  . Smoking status: Never Smoker   . Smokeless tobacco: Former Neurosurgeon    Types: Chew    Quit date: 01/30/1985  . Alcohol Use: No     Comment: Quit 30 years ago  . Drug Use: No  . Sexually Active: Not on file   Other Topics Concern  . Not on file   Social History Narrative   Married with 4 children         Review of Systems Right leg edema is some better No fever    Objective:   Physical Exam  Constitutional: He appears well-developed and well-nourished. No distress.  Neck: Normal range of motion. Neck supple. No thyromegaly present.  Cardiovascular: Normal rate and normal heart sounds.  Exam reveals no gallop.   No murmur heard. irregular  Pulmonary/Chest: Effort normal and breath sounds normal. No respiratory distress. He has no wheezes. He has no rales.  Lymphadenopathy:    He has no cervical adenopathy.  Skin:  ~63mm ulcer on right buttock near fold Sacral area without ulcer now  Psychiatric:  Still sedate          Assessment & Plan:

## 2012-05-29 NOTE — Patient Instructions (Addendum)
Please take metolazone 5mg  as prescribed- 30 minutes in the morning prior to taking your first dose of torsemide every other day. Continue current torsemide dose.   Please restrict your fluid intake. Hold off on salt. Please wear compression stockings and elevate legs whenever at rest.   Will hold losartan and colchicine.   Resume low-dose ASA 81 mg.   We will plan to draw repeat labwork on Monday- to assess kidney function and electrolytes.   Increase potassium supplement to three times a day.   We will be in touch on Monday to reassess weight, swelling and symptoms.   Please follow-up with Dr. Cherylann Ratel which has been rescheduled for next week .  If your symptoms become worse, or you do not experience significant weight loss, please present to the nearest emergency department for further management.

## 2012-05-29 NOTE — Assessment & Plan Note (Signed)
Consider renally-adjusting glipizide with reduced kidney function.

## 2012-05-29 NOTE — Assessment & Plan Note (Signed)
Seems to have ischemic symptoms consistent with unstable angina Not really a candidate for intervention Seeing Dr Mariah Milling today---he can decide on changes to medical therapy

## 2012-05-29 NOTE — Assessment & Plan Note (Addendum)
Previous data including weight, labs, echo and medications reviewed from old records. Patient markedly volume overloaded today. He has had minimal diuresis, in fact further volume retention since adjusting his diuretic regimen from furosemide to torsemide. Differential etiologies include reduced excretion from acute on CKD, suboptimal diuretic dosing given CKD, ischemic cardiomyopathy, ongoing active ischemia. EKG in the office today does indicate lateral ischemic changes (both old and new). Cr has jumped significantly over the past month, and there is concern that a primary renal issue is contributing more. Less likely DVT/PE with chronic Coumadin anticoagulation. After discussing with Dr. Mariah Milling, will plan to add metolazone 5mg  every other day in addition to torsemide regimen. This will be taken 30 min prior to his AM torsemide dose. Will increase KCl supplementation. Spent a considerable amount of time discussing appropriate CHF management- weight monitoring, salt/fluid restriction, compression stockings/leg elevation, etc. He will record weight over the next several days and over the weekend. We will call him for an update on Monday and check a repeat BMET to assess renal function. Advised that if during this time, his weights continue to trend up and he declines symptomatically, to present to the nearest ED.

## 2012-05-31 NOTE — Addendum Note (Signed)
Addended by: Baldomero Lamy on: 05/31/2012 12:35 PM   Modules accepted: Orders

## 2012-06-02 ENCOUNTER — Other Ambulatory Visit: Payer: Self-pay | Admitting: Cardiovascular Disease

## 2012-06-03 ENCOUNTER — Telehealth: Payer: Self-pay

## 2012-06-03 ENCOUNTER — Ambulatory Visit (INDEPENDENT_AMBULATORY_CARE_PROVIDER_SITE_OTHER): Payer: No Typology Code available for payment source

## 2012-06-03 DIAGNOSIS — I4891 Unspecified atrial fibrillation: Secondary | ICD-10-CM

## 2012-06-03 NOTE — Telephone Encounter (Signed)
FYI

## 2012-06-03 NOTE — Telephone Encounter (Signed)
Assess symptoms/blood work

## 2012-06-03 NOTE — Telephone Encounter (Signed)
Sounds like the metolazone is working well, hopefully not at the expense of worsening renal function. Will review on BMP drawn today. Full compliance with CHF management seems to be an issue as well (fluid restriction before, compression stockings now). At this point, every little bit helps to improve overall picture. Thanks for addressing.   Jacqulyn Bath, PA-C 06/03/2012 10:48 AM

## 2012-06-03 NOTE — Telephone Encounter (Signed)
I spoke with pt/pt's wife They say weight is down from 222 pounds to 217 pounds. He takes metolazone today Denies CP over the last 2 days No complaints today "forgets" to put compression stockings on. I advised to wear these since this may help with edema Understanding verb Had BMP checked today We will call them with results

## 2012-06-04 ENCOUNTER — Other Ambulatory Visit: Payer: Self-pay

## 2012-06-04 ENCOUNTER — Telehealth: Payer: Self-pay

## 2012-06-04 MED ORDER — METOLAZONE 5 MG PO TABS: ORAL_TABLET | ORAL | Status: DC

## 2012-06-04 NOTE — Telephone Encounter (Signed)
I spoke with pt's wife who was made aware of Dr. Windell Hummingbird plan. She will have pt hold metolazone until pt sees Dr. Cherylann Ratel 5/8 Says pt weight=216 pounds (down from 217 pounds yesterday). Still has some LE edema, but slowly improving Confirms adequate U/O. C/o dry mouth Says pt not drinking very much but will have him drink a little more today We will await Dr. Garnett Farm office visit

## 2012-06-04 NOTE — Telephone Encounter (Signed)
Kenneth Money, PA called UE:AVWU recent BMP (creat=3.8). He asks that I page Dr. Mariah Milling to have him call PA about this Considering admitting pt for nephrology consult versus zaroxolyn change and keep appt with Dr. Cherylann Ratel scheduled for 5/8  I paged Dr. Mariah Milling who called back. I asked him to contact PA about this but he gave orders to "have pt decrease zaroxolyn to 2x weekly if he needs this.  Otherwise he may take PRN edema.  Should keep appointment with Dr. Cherylann Ratel as scheduled.  Advise pt that weight he is at today is his 'dry weight' and should dry to stay a little more wet. OK to keep appointment with me as scheduled for 07/02/12" VO Dr. Alvis Lemmings, RN  I attempted to page [name], [place] about this.  Awaiting his return call

## 2012-06-04 NOTE — Telephone Encounter (Signed)
I was able to speak to [name], [place] about Dr. Windell Hummingbird response.  He agrees with plan I will call pt to make him aware

## 2012-06-07 ENCOUNTER — Other Ambulatory Visit: Payer: Self-pay | Admitting: Internal Medicine

## 2012-06-07 ENCOUNTER — Telehealth: Payer: Self-pay | Admitting: Internal Medicine

## 2012-06-07 ENCOUNTER — Ambulatory Visit (INDEPENDENT_AMBULATORY_CARE_PROVIDER_SITE_OTHER): Payer: No Typology Code available for payment source | Admitting: Cardiology

## 2012-06-07 ENCOUNTER — Telehealth: Payer: Self-pay

## 2012-06-07 LAB — POCT INR: INR: 1.9

## 2012-06-07 NOTE — Telephone Encounter (Signed)
Wife called to let us know pt had INR checked at Dr. Karle Starch office and was told there would be a $25 copay. She says they cannot afford this and would like to switch to our coumadin clinic. I told her I would make coumadin clinic aware and they would be in contact with pt/his wife.  Understanding verb

## 2012-06-07 NOTE — Telephone Encounter (Signed)
Patient's wife called to cancel patient coumadin appointment on 06/20/12.  She said they can't afford the $25 co-pay for every visit.  Patient will be going to Dr.Gollan's office to have the protime checked.

## 2012-06-07 NOTE — Telephone Encounter (Signed)
Wife called to let Dr. Mariah Milling know he went to Dr. Cherylann Ratel as scheduled and Dr. Cherylann Ratel stopped metolazone and had him hold torsemide and KCL over w/e.  He will go back for labs Monday 06/10/12. F/u again with Dr. Cherylann Ratel 6/6 I will make dr. Mariah Milling aware

## 2012-06-07 NOTE — Telephone Encounter (Signed)
Called spoke with pt's wife.  Pt's specialty copay to see Dr Mariah Milling is $60.  Advised pt's wife unfortunately this is the same copay they would be responsible for if they are seen in the [facility].  Pt's primary care copay is $25, therefor it is cheaper to have Coumadin checked and monitored there.  Pt's wife verbalized understanding will call Dr Karle Starch office to schedule f/u.

## 2012-06-07 NOTE — Telephone Encounter (Signed)
That is fine as long as he keeps getting regular checks, but I think they may have the same set up there

## 2012-06-10 ENCOUNTER — Ambulatory Visit: Payer: No Typology Code available for payment source | Admitting: Internal Medicine

## 2012-06-10 ENCOUNTER — Telehealth: Payer: Self-pay | Admitting: Internal Medicine

## 2012-06-10 NOTE — Telephone Encounter (Signed)
Please call and offer them an appt tomorrow at 10:15

## 2012-06-10 NOTE — Telephone Encounter (Signed)
Patient Information:  Caller Name: Harrison Mons  Phone: [phone]  Patient: [name], [name]  Gender: Male  DOB: 1931-10-04  Age: 79 Years  PCP: Tillman Abide [facility])  Office Follow Up:  Does the office need to follow up with this patient?: Yes  Instructions For The Office: no appts available within dispositioned time frame krs/can  RN Note:  Has new skin lesion/open sore left leg.  Has been placing zinc oxide on it, but states it looks worse 06/10/12.   Patient afebrile.  Sore area is warm to touch.  Patient is sleeping all day.  Increased ankle edema.  Per skin lesion protocol, advised appt today; no appts available within dispositioned time frame.  Info to office via Epic/high priority for provider review/appt workin/callback.  May reach patient at [phone].  krs/can  Symptoms  Reason For Call & Symptoms: Patient gaining weight over past 24 hours.  Keeps a weight chart.  Weight was 219# 06/10/12 and 217# 06/08/12.  Notes two new sores on his leg and buttocks.  Given 20u insulin for blood sugar of 240.  Has not been eating, and is sleeping all day.  Reviewed Health History In EMR: Yes  Reviewed Medications In EMR: Yes  Reviewed Allergies In EMR: Yes  Reviewed Surgeries / Procedures: Yes  Date of Onset of Symptoms: Unknown  Guideline(s) Used:  Skin Lesion - Moles or Growths  Disposition Per Guideline:   See Today in Office  Reason For Disposition Reached:   Looks like a boil, infected sore, or deep ulcer  Advice Given:  N/A  Patient Will Follow Care Advice:  YES

## 2012-06-10 NOTE — Telephone Encounter (Signed)
appt made

## 2012-06-11 ENCOUNTER — Ambulatory Visit (INDEPENDENT_AMBULATORY_CARE_PROVIDER_SITE_OTHER): Payer: No Typology Code available for payment source | Admitting: Internal Medicine

## 2012-06-11 ENCOUNTER — Encounter: Payer: Self-pay | Admitting: Internal Medicine

## 2012-06-11 VITALS — BP 120/60 | HR 78 | Temp 98.1°F | Wt 218.0 lb

## 2012-06-11 DIAGNOSIS — IMO0001 Reserved for inherently not codable concepts without codable children: Secondary | ICD-10-CM

## 2012-06-11 DIAGNOSIS — L97909 Non-pressure chronic ulcer of unspecified part of unspecified lower leg with unspecified severity: Secondary | ICD-10-CM

## 2012-06-11 DIAGNOSIS — I83009 Varicose veins of unspecified lower extremity with ulcer of unspecified site: Secondary | ICD-10-CM

## 2012-06-11 MED ORDER — HYDROGEL GEL: 1.0000 "application " | Freq: Every day | Status: AC

## 2012-06-11 NOTE — Patient Instructions (Addendum)
Please skip the torsemide and potassium any day your weight is under 215# at home.  Please use the wet to dry saline for 2 more days---then use the hydrogel or neosporin daily with gauze covering.

## 2012-06-11 NOTE — Assessment & Plan Note (Signed)
Wet to dry dressing applied Wife instructed to do this for 2 more days--then change to hydrogel

## 2012-06-11 NOTE — Progress Notes (Signed)
Subjective:    Patient ID: [name], male    DOB: 1931/05/07, 79 y.o.   MRN: [id number]  HPI Weight down another 10# Has been off the zaroxlyn Held the torsemide over the weekend---just restarted yesterday  No significant dizziness--very slight and brief orthostasis Legs are better with fluid Weight now ~215-218# at home  Sugars had been high--over 200 Wife increased the insulin back up to 20 units  Left calf ulcer was "fiery red" yesterday Buttock ulcer just isn't healing Pain when he sits on it Have been using End-it daily and cover with bandages  Current Outpatient Prescriptions on File Prior to Visit  Medication Sig Dispense Refill  . aspirin EC 81 MG tablet Take 1 tablet (81 mg total) by mouth daily.  90 tablet  3  . Cholecalciferol (VITAMIN D) 2000 UNITS CAPS Take 1 capsule by mouth daily.      . cyclobenzaprine (FLEXERIL) 10 MG tablet TAKE ONE TABLET BY MOUTH THREE TIMES A DAY FOR MUSCLE SPASMS  30 tablet  0  . doxazosin (CARDURA) 8 MG tablet Take 1 tablet (8 mg total) by mouth at bedtime.  30 tablet  6  . fluticasone (FLONASE) 50 MCG/ACT nasal spray 2 sprays by Nasal route as needed.        Marland Kitchen glipiZIDE (GLUCOTROL XL) 10 MG 24 hr tablet Take 1 tablet (10 mg total) by mouth daily.  30 tablet  5  . glucose blood (BAYER CONTOUR TEST) test strip Use as instructed, check blood sugar three times daily       . hydrALAZINE (APRESOLINE) 100 MG tablet Take 1 tablet (100 mg total) by mouth 3 (three) times daily.  90 tablet  6  . insulin detemir (LEVEMIR FLEXPEN) 100 UNIT/ML injection Inject 12 Units into the skin daily.  9 mL  12  . Insulin Pen Needle (PEN NEEDLES 5/16") 30G X 8 MM MISC Inject 20 Units into the skin as directed. As directed.  100 each  6  . isosorbide mononitrate (IMDUR) 60 MG 24 hr tablet Take 1 tablet (60 mg total) by mouth 2 (two) times daily.  60 tablet  6  . metoprolol tartrate (LOPRESSOR) 25 MG tablet Take 1 tablet (25 mg total) by mouth 2 (two) times daily.   60 tablet  6  . Multiple Vitamin (MULTIVITAMIN) tablet Take 1 tablet by mouth daily.        . nitroGLYCERIN (NITROSTAT) 0.4 MG SL tablet Place 1 tablet (0.4 mg total) under the tongue every 5 (five) minutes as needed.  30 tablet  3  . omeprazole (PRILOSEC) 20 MG capsule Take 20 mg by mouth daily.        . polyethylene glycol (MIRALAX / GLYCOLAX) packet Take 17 g by mouth daily.      . potassium chloride (K-DUR) 10 MEQ tablet Take 1 tablet (10 mEq total) by mouth 2 (two) times daily.  60 tablet  3  . Tamsulosin HCl (FLOMAX) 0.4 MG CAPS Take 0.4 mg by mouth.        . torsemide (DEMADEX) 20 MG tablet Take 40 mg by mouth daily. If weight gain is 3# or more daily then take an extra tablet at lunchtime. Skip any day your weight at home is under 215#      . traZODone (DESYREL) 50 MG tablet Take 1-2 tablets (50-100 mg total) by mouth at bedtime.  60 tablet  11  . warfarin (COUMADIN) 5 MG tablet TAKE 1 AND 1/2 TABLET BY MOUTH DAILY OR  AS DIRECTED  60 tablet  0  . ZINC OXIDE, TOPICAL, 10 % CREA Apply topically as needed.       No current facility-administered medications on file prior to visit.    Allergies  Allergen Reactions  . Buspirone Hcl     REACTION: unknown  . Sulfonamide Derivatives     REACTION: unspecified    Past Medical History  Diagnosis Date  . Allergy   . Anemia   . GERD (gastroesophageal reflux disease)   . Hypertension   . Osteoarthritis   . Hyperlipidemia   . BPH (benign prostatic hyperplasia)     Dr. Achilles Dunk  . Diastolic CHF, acute   . S/P ablation of atrial fibrillation   . Anxiety   . 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 Diamond Nickel  . Allergic rhinitis   . CAD (coronary artery disease)     Dr. Diamond Nickel  . Diabetes mellitus type II   . Atrial flutter 07/1999  . Atrial tachycardia 03/2000  . Chest pain 03/2000  . Vascular dementia     Past Surgical History  Procedure Laterality Date  . Angioplasty      multiple, and stents  . Coronary artery bypass  graft  01/2000  . Circumcision  12/2005  . Coronary stent placement  07/2003    RCA  . Circumcision  12/2005    Family History  Problem Relation Age of Onset  . Diabetes Mother   . Kidney disease Mother     ESRD  . Heart disease Father     CVA  . Stroke Father 40    deceased from same  . Hypertension Sister   . Coronary artery disease Sister   . Depression Daughter     History   Social History  . Marital Status: Married    Spouse Name: N/A    Number of Children: 4  . Years of Education: N/A   Occupational History  . retired     disabled due to CAD   Social History Main Topics  . Smoking status: Never Smoker   . Smokeless tobacco: Former Neurosurgeon    Types: Chew    Quit date: 01/30/1985  . Alcohol Use: No     Comment: Quit 30 years ago  . Drug Use: No  . Sexually Active: Not on file   Other Topics Concern  . Not on file   Social History Narrative   1 daughter, Jennette Kettle, died      No living will   No formal health care POA---requests wife then niece Karel Jarvis   Would accept resuscitation attempts   Would accept feeding tube            Review of Systems No fever Appetite has been off     Objective:   Physical Exam  Skin:  Left buttock ulcer is clean and seems to be granulating  Small left calf ulcer Not inflamed or tender Non healing eschar and edges not active          Assessment & Plan:

## 2012-06-19 ENCOUNTER — Ambulatory Visit: Payer: No Typology Code available for payment source | Admitting: Internal Medicine

## 2012-06-20 ENCOUNTER — Ambulatory Visit: Payer: No Typology Code available for payment source | Admitting: Internal Medicine

## 2012-06-20 ENCOUNTER — Telehealth: Payer: Self-pay | Admitting: Internal Medicine

## 2012-06-20 ENCOUNTER — Ambulatory Visit: Payer: No Typology Code available for payment source

## 2012-06-20 DIAGNOSIS — I509 Heart failure, unspecified: Secondary | ICD-10-CM

## 2012-06-20 DIAGNOSIS — I214 Non-ST elevation (NSTEMI) myocardial infarction: Secondary | ICD-10-CM

## 2012-06-20 DIAGNOSIS — R0602 Shortness of breath: Secondary | ICD-10-CM

## 2012-06-20 NOTE — Telephone Encounter (Signed)
Will check on him later Not checked into ER yet

## 2012-06-20 NOTE — Telephone Encounter (Signed)
Pt's wife called and wanted to let you know she has called the paramedics to take Mr. Hilmer to the ED.  He is SOB, coughing up blood, and no appetite. She says they are taking him to [facility].  Thank you.

## 2012-06-21 ENCOUNTER — Telehealth: Payer: Self-pay

## 2012-06-22 NOTE — Telephone Encounter (Signed)
Spoke to wife to express my condolences

## 2012-06-30 NOTE — Telephone Encounter (Signed)
Per Dr. Mariah Milling, he wanted me to let Dr. Alphonsus Sias know pt died this am at [facility] I will let him know

## 2012-06-30 DEATH — deceased

## 2012-07-02 ENCOUNTER — Ambulatory Visit: Payer: No Typology Code available for payment source | Admitting: Cardiovascular Disease

## 2012-07-09 ENCOUNTER — Ambulatory Visit: Payer: Self-pay | Admitting: General Practice

## 2012-07-09 NOTE — Patient Instructions (Signed)
Pt expired.

## 2013-10-15 ENCOUNTER — Telehealth: Payer: Self-pay | Admitting: Internal Medicine

## 2013-10-15 NOTE — Telephone Encounter (Signed)
10/15/13-Niece called to check status of insurance medical records request. No requests receive, provided fax number to give to [facility]. rmf

## 2014-03-09 IMAGING — US US CAROTID DUPLEX BILAT
1 series · 13 of 24 positions shown · non-contrast
Comparison: none

REASON FOR EXAM: syncope
COMMENTS:

[Series 1: us carotid duplex bilat · 13 of 74 slices shown]
[im 1/74]
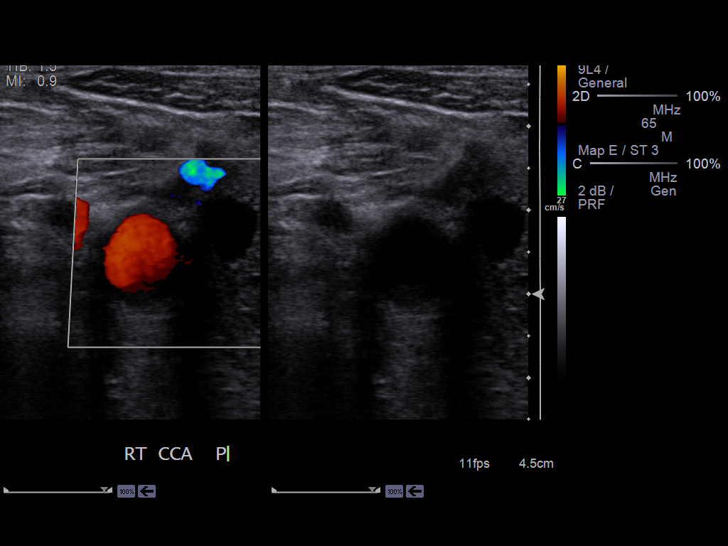
[im 7/74]
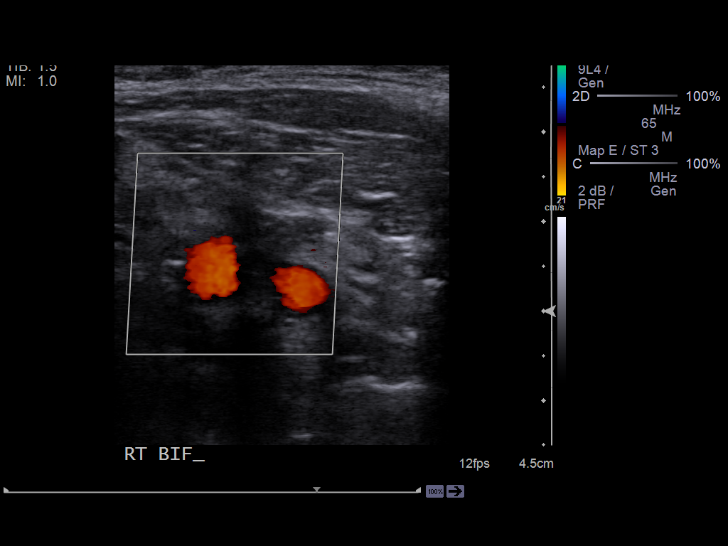
[im 13/74]
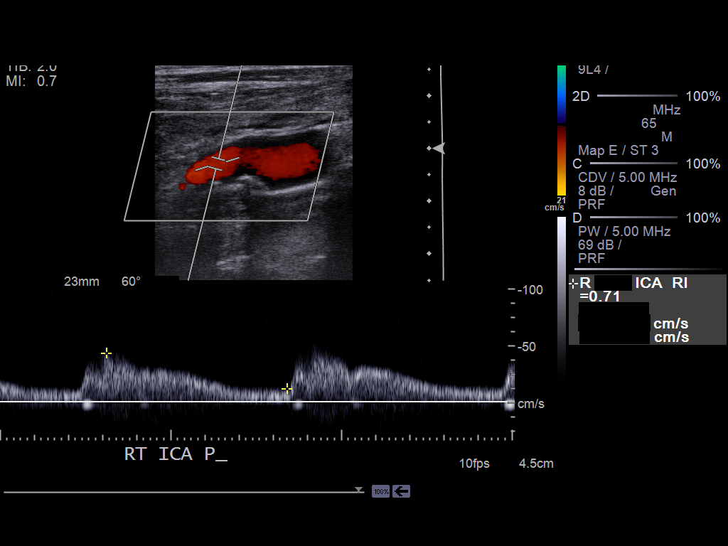
[im 20/74]
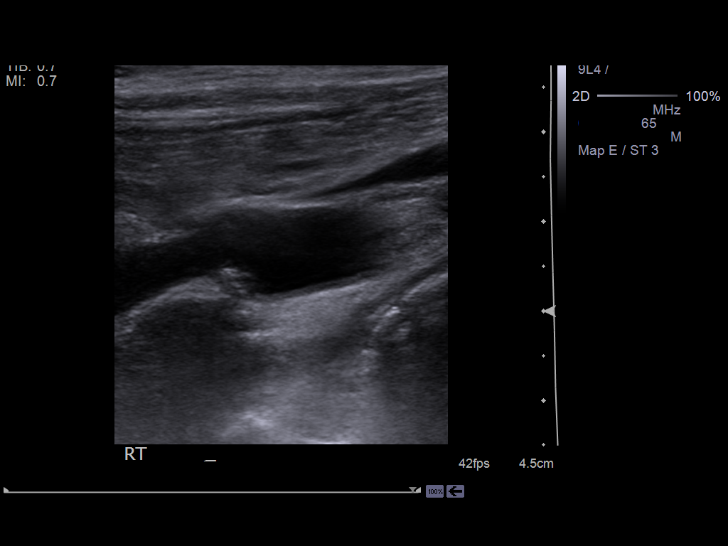
[im 26/74]
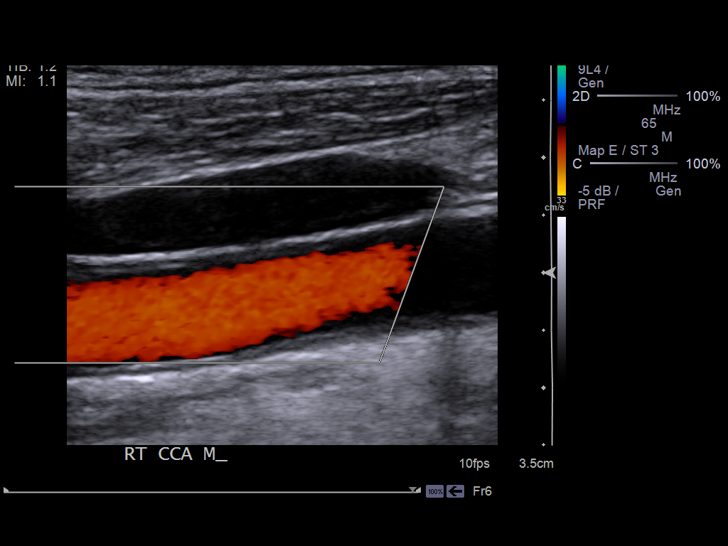
[im 32/74]
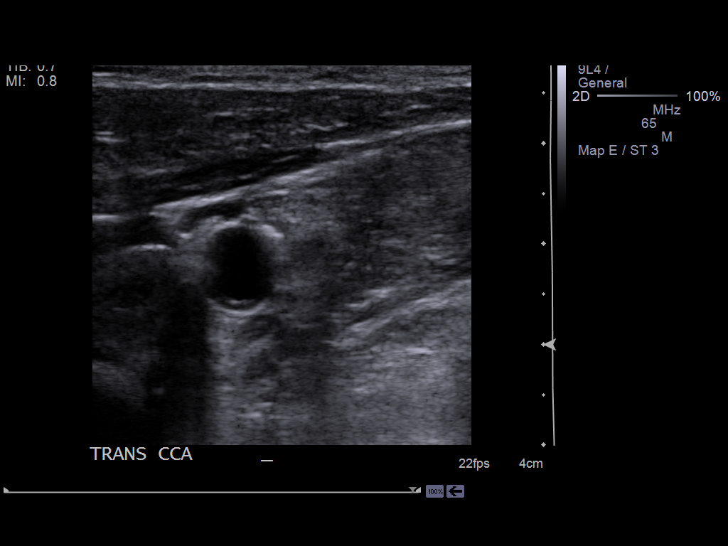
[im 39/74]
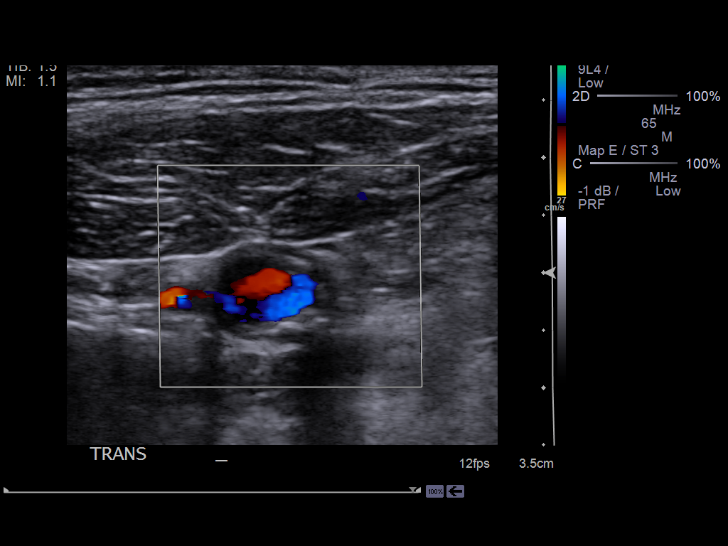
[im 42/74]
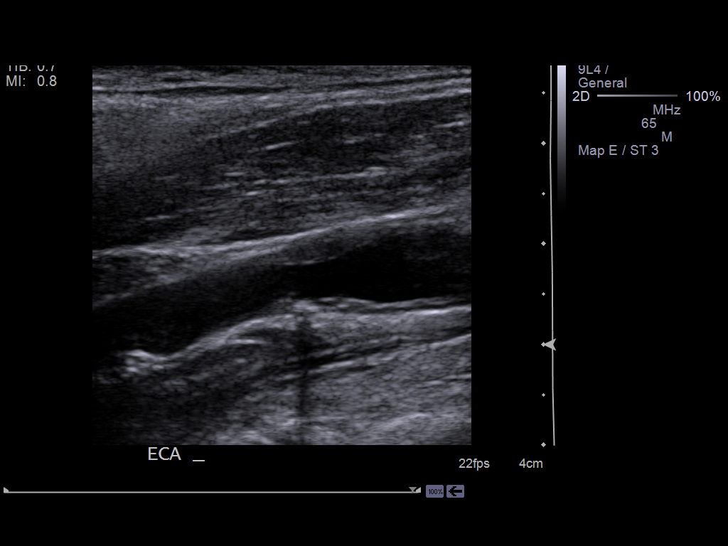
[im 48/74]
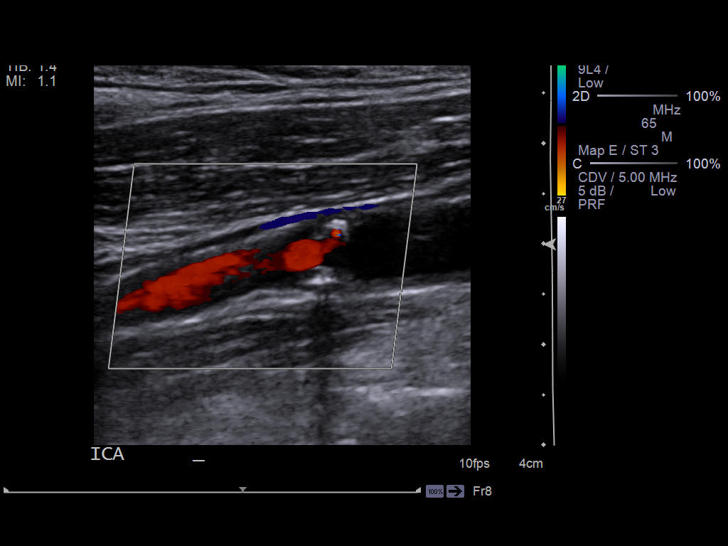
[im 54/74]
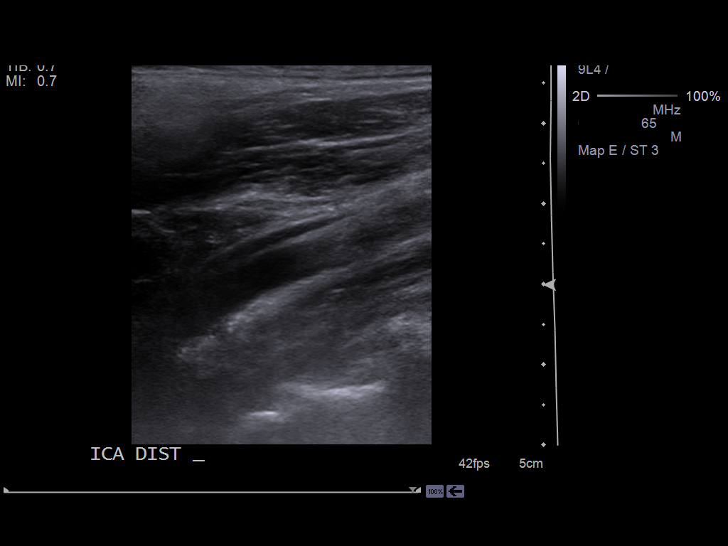
[im 61/74]
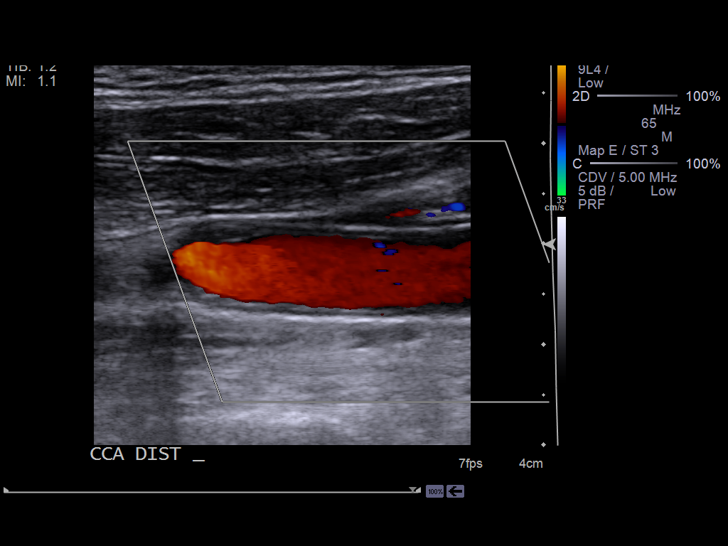
[im 67/74]
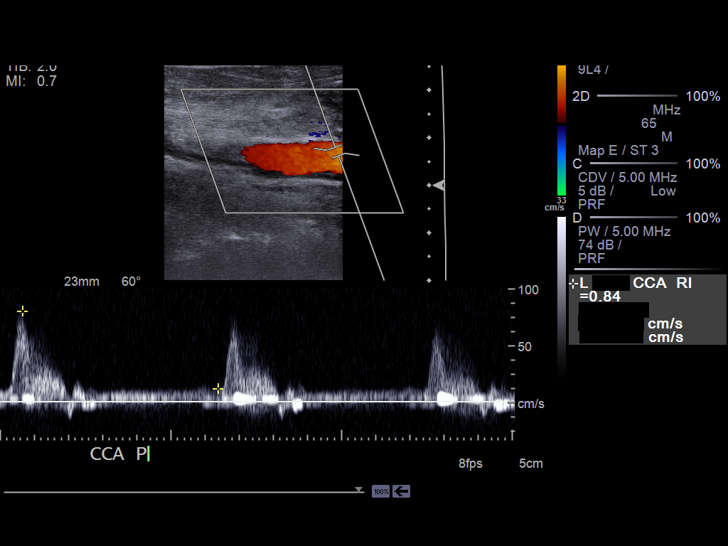
[im 74/74]
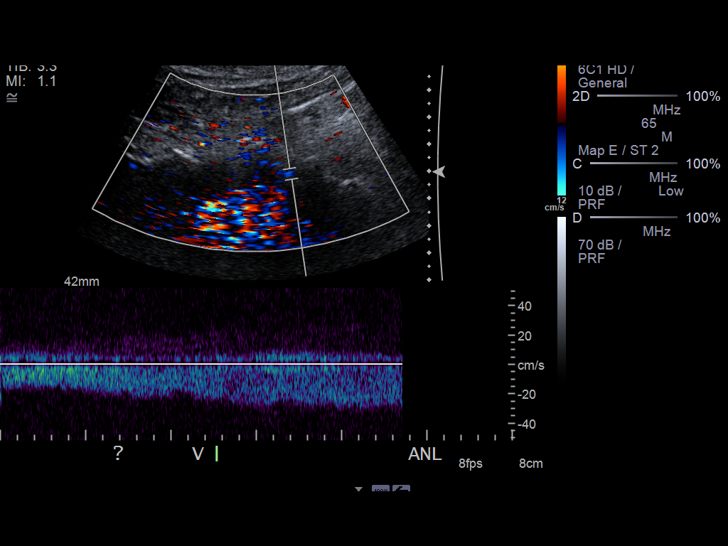

[13 of 24 positions shown; findings below may reference images not displayed]

PROCEDURE:     US  - US CAROTID DOPPLER BILATERAL  - December 08, 2011 [DATE]

RESULT:     Both right and left carotid systems were evaluated with
grayscale and color flow Doppler techniques. On the right there is a small
amount of calcified and soft plaque at the bulb with small amounts of
calcified plaque elsewhere. On the left and there is a small amount of
calcified plaque in the distal CCA as well as mixed plaque at the bulb.
There is soft and calcified plaque in the proximal ICA with an area of
ulceration demonstrated. The waveform patterns and color flow images do not
suggest significant turbulence.

On the right the peak internal carotid systolic velocity measured 69 cm/sec
and the peak common carotid velocity measured 61 cm/second corresponding to
a ratio of 1.1. On the left the peak internal carotid systolic velocity
measured 100 cm/sec and the peak common carotid velocity measured 81
cm/second corresponding to a normal ratio of 1.2. The vertebral artery on
the left is normal in flow direction. The right vertebral artery was not
demonstrated.
IMPRESSION: There are moderate amounts of calcified and soft plaque
bilaterally. There is no evidence of a hemodynamically significant stenosis.
An ulcerated plaque is present on the left in the proximal ICA. Further
evaluation with MR angiography or CT angiography would be useful. This would
also allow attempts at visualization of the nonvisualized right vertebral
artery.

[REDACTED]

## 2014-04-04 IMAGING — CR DG CHEST 2V
1 series · 2 of 2 positions shown · non-contrast
Comparison: none

REASON FOR EXAM: [place] M cough x 1 week
COMMENTS:

PROCEDURE:     DXR - DXR CHEST PA (OR AP) AND LATERAL  - January 03, 2012  [DATE]
RESULT:     Comparison: 12/07/2011

[Series 1: x chest ap · 0.14mm/px · 2 of 2 slices shown]
[im 1/2]
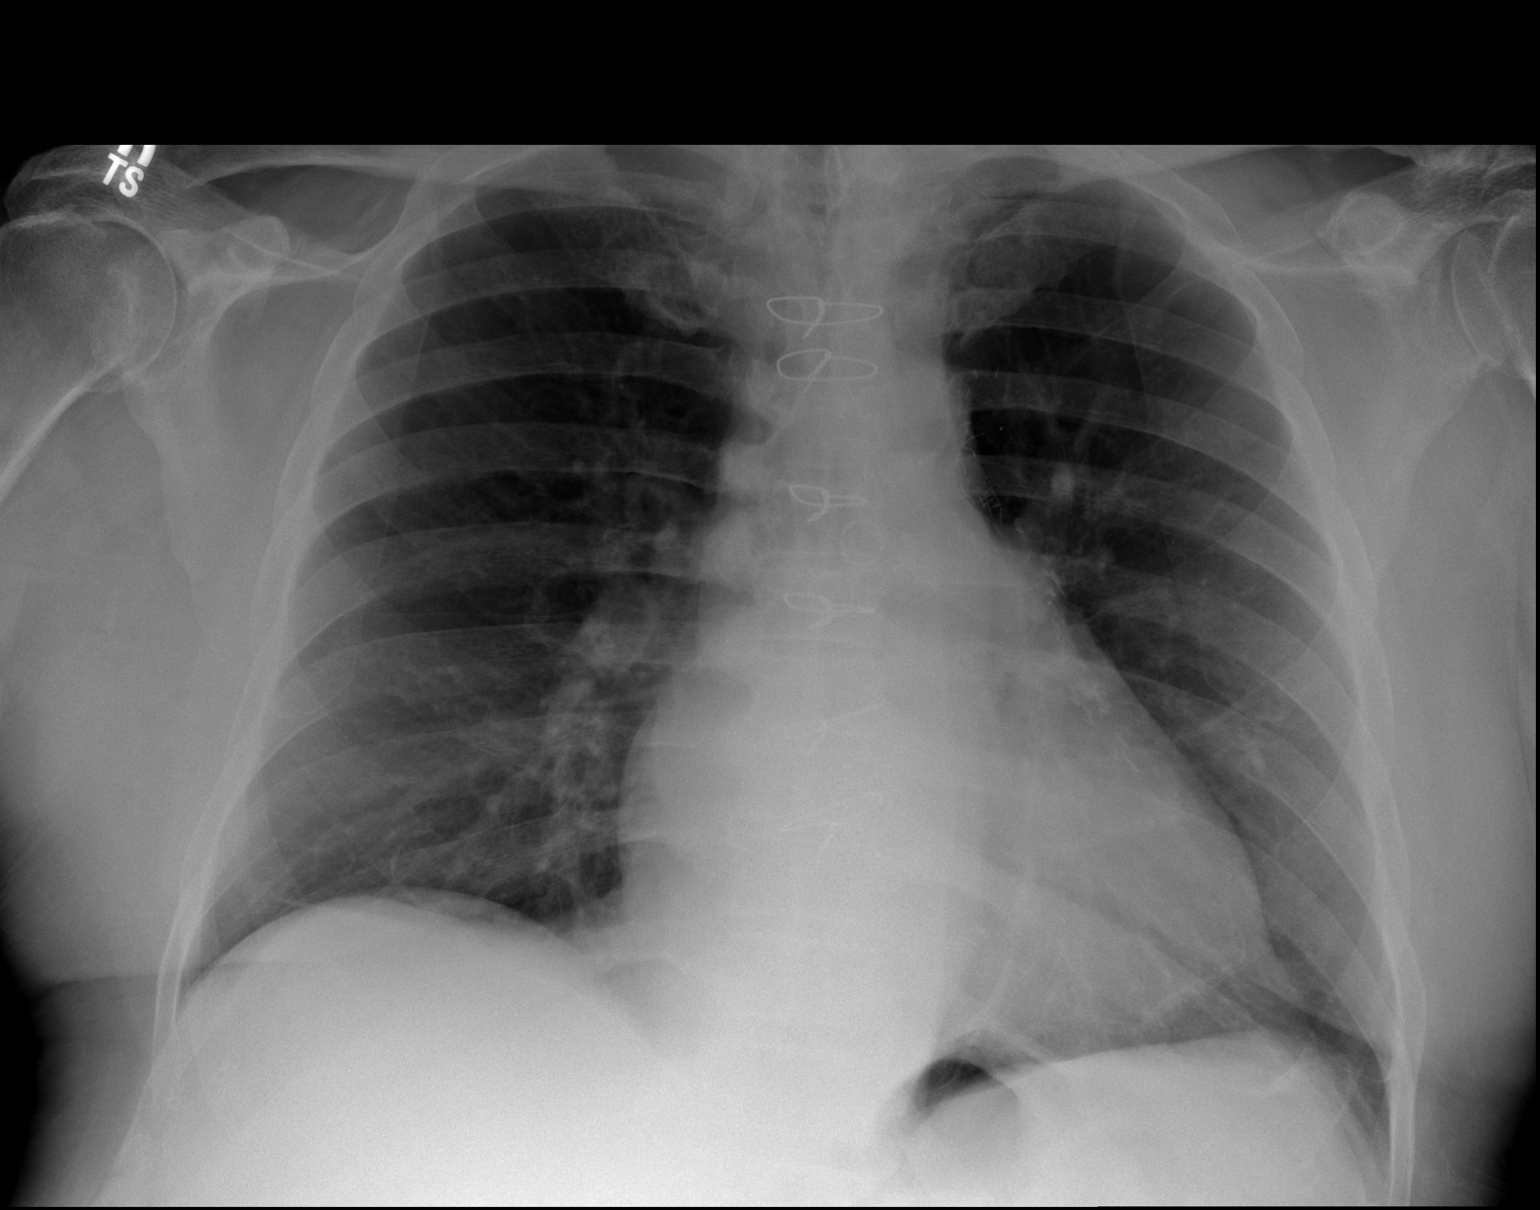
[im 2/2]
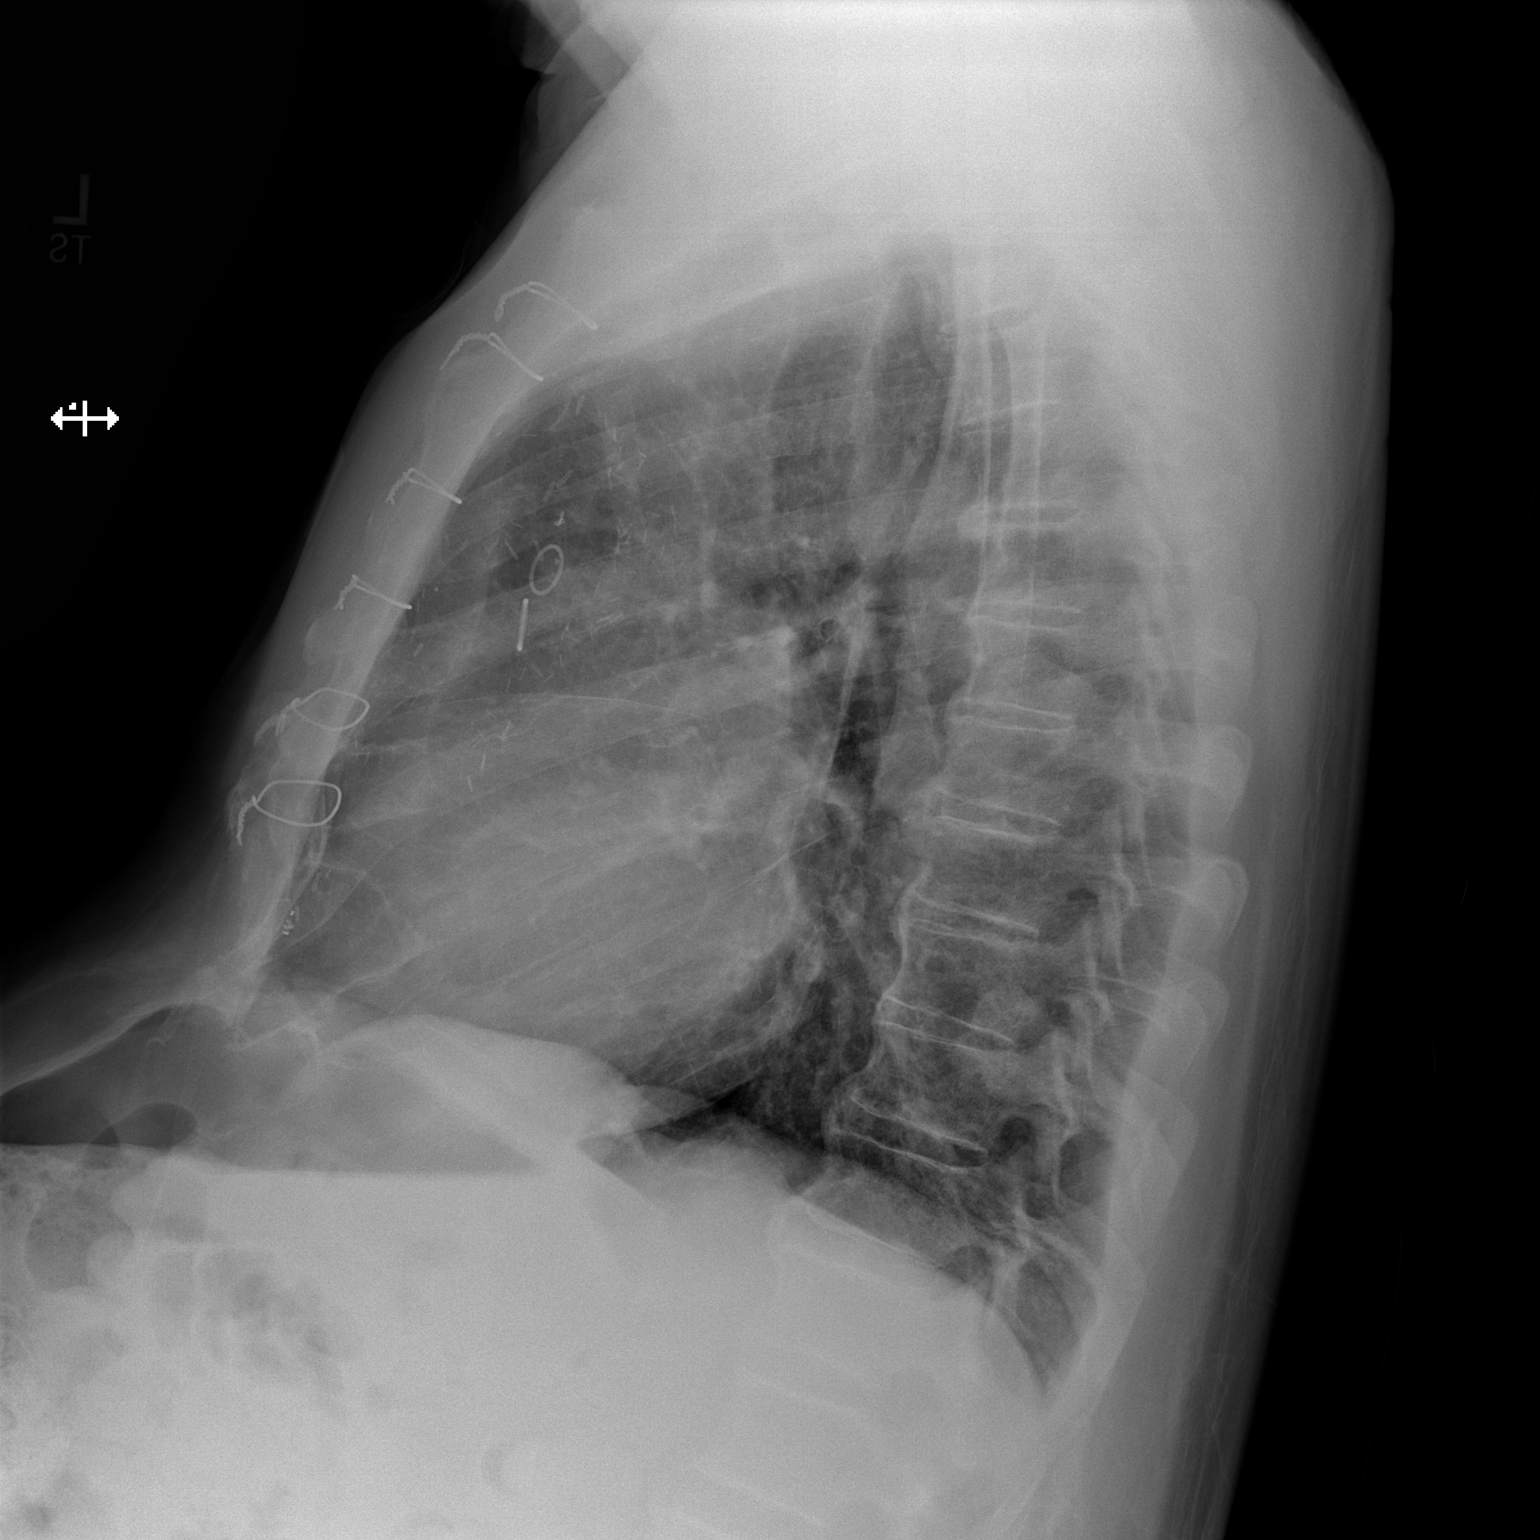

[2 of 2 positions shown; findings below may reference images not displayed]

FINDINGS: Cardiomegaly and the mediastinum are stable. Prior median sternotomy and
CABG. No focal pulmonary opacities.
IMPRESSION: No acute cardiopulmonary disease.

## 2014-04-04 IMAGING — CT CT ABD-PELV W/O CM
1 of 2 series · 15 of 32 positions shown, 19 images · non-contrast
Comparison: none

REASON FOR EXAM: (1) ruq tendeerness, l groin pain; (2) pain
COMMENTS:

PROCEDURE:     CT  - CT ABDOMEN AND PELVIS W[DATE]  [DATE]
RESULT:     Comparison: 12/07/2011
TECHNIQUE: Multiple axial images from the lung bases to the symphysis pubis
were obtained without oral and without intravenous contrast.

[Series 2: 3mm soft tissue · axial · 0.86mm/px · z∈[-460,-60]mm · 15 of 147 slices shown, 19 images]
[im 7/147  soft-tissue]
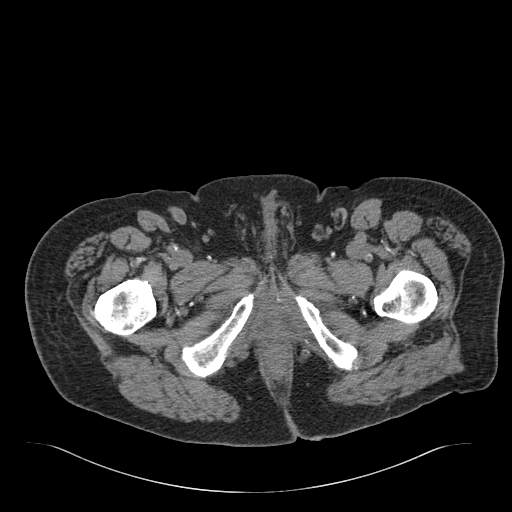
[im 7/147  bone]
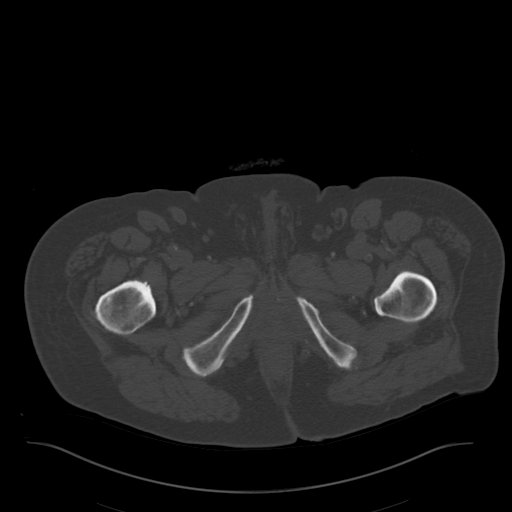
[im 19/147  soft-tissue]
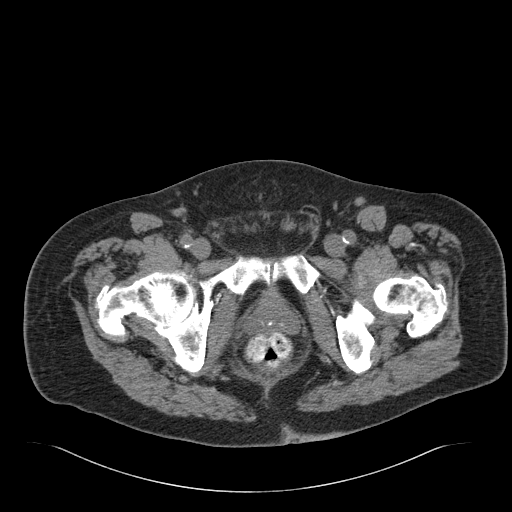
[im 31/147  soft-tissue]
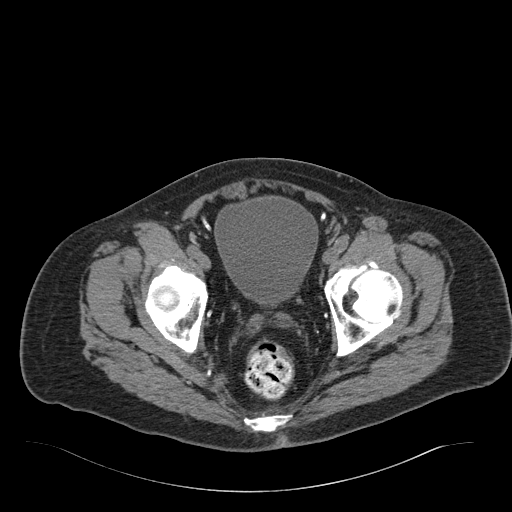
[im 43/147  soft-tissue]
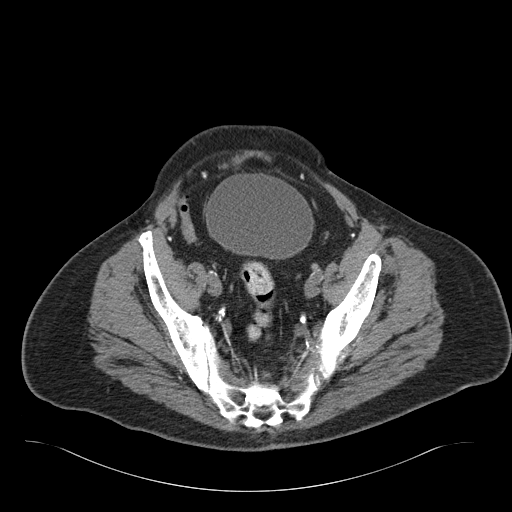
[im 49/147  soft-tissue]
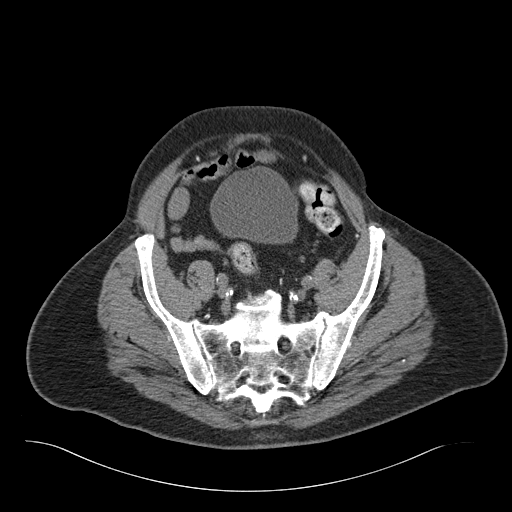
[im 61/147  soft-tissue]
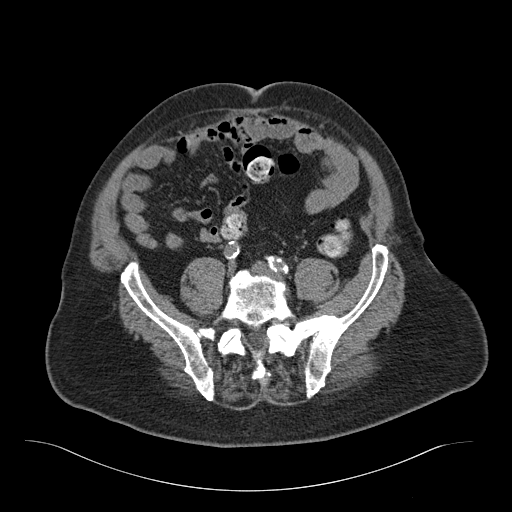
[im 74/147  soft-tissue]
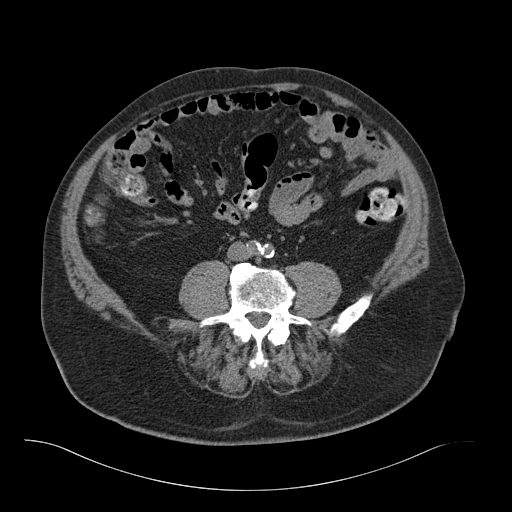
[im 86/147  soft-tissue]
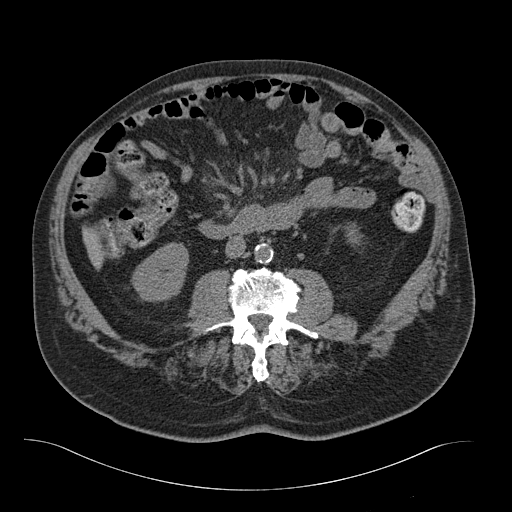
[im 98/147  soft-tissue]
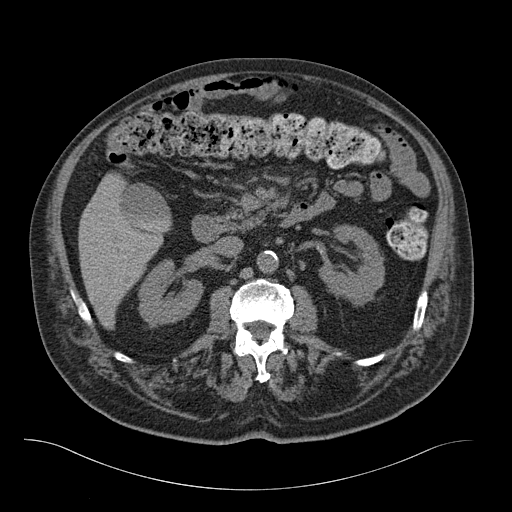
[im 98/147  bone]
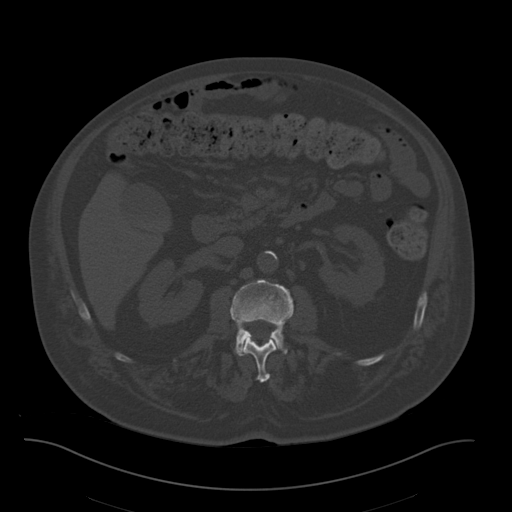
[im 104/147  soft-tissue]
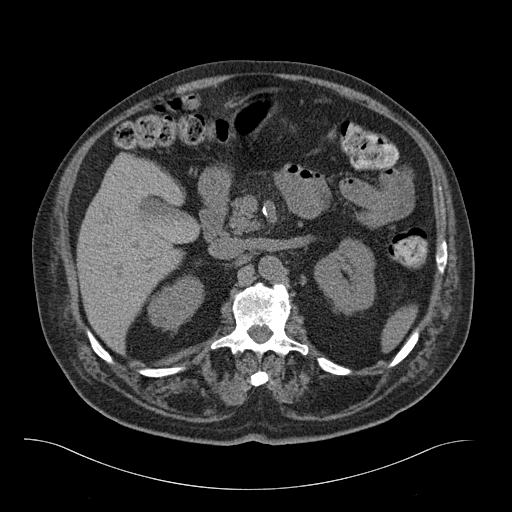
[im 116/147  soft-tissue]
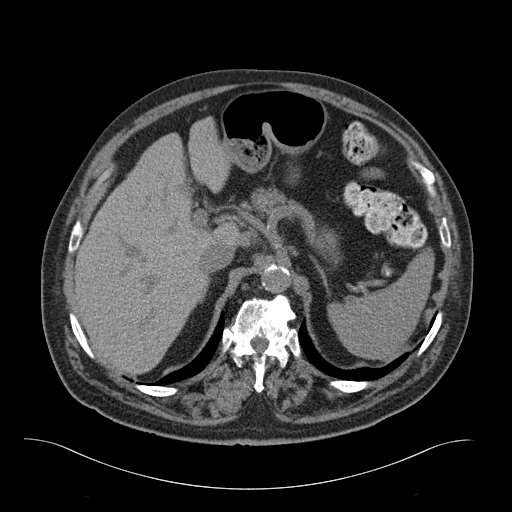
[im 122/147  lung]
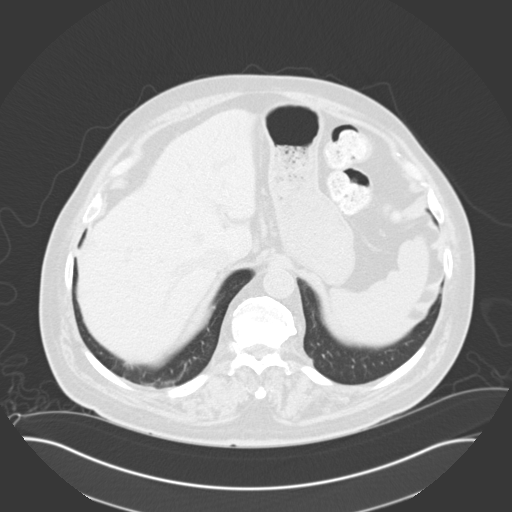
[im 128/147  soft-tissue]
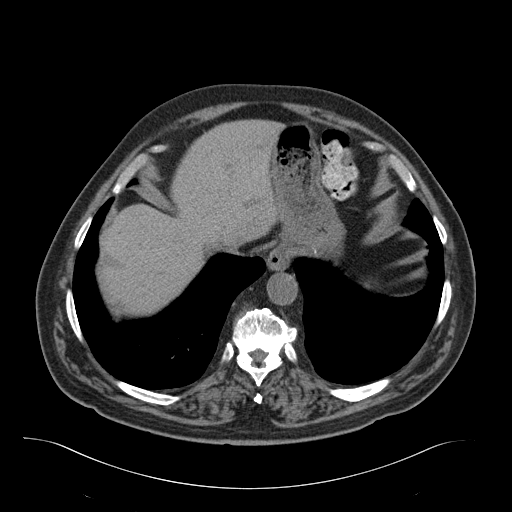
[im 128/147  lung]
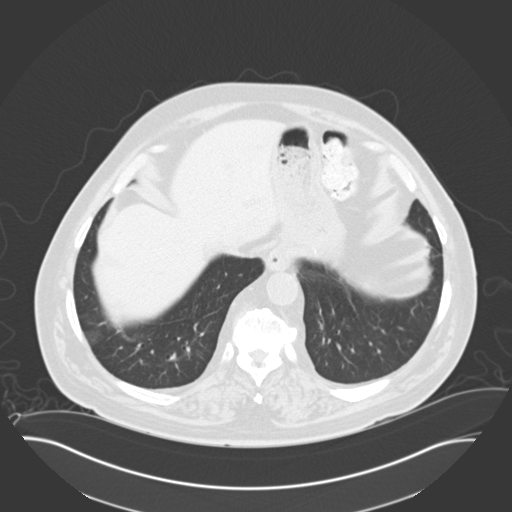
[im 134/147  lung]
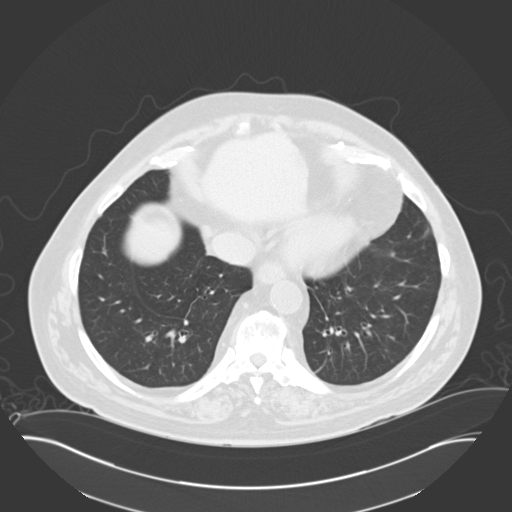
[im 140/147  soft-tissue]
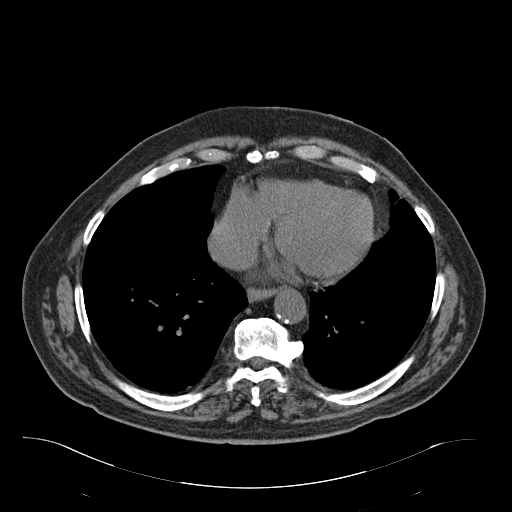
[im 140/147  lung]
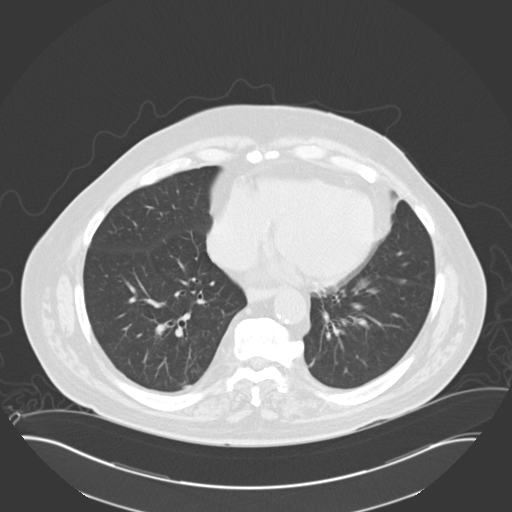

[15 of 32 positions shown; findings below may reference images not displayed]

FINDINGS: Calcifications are seen in the coronary arteries.

Lack of intravenous contrast limits evaluation of the solid abdominal
organs.  Relative increased density within the gallbladder may represent
sludge are multiple tiny gallstones. The liver, spleen, adrenals, and
pancreas are unremarkable. No renal calculi or hydronephrosis. No
ureterectasis.

There is slight increased density in the mesenteric fat. This is
nonspecific, but can be seen with etiologies such as mesenteric
panniculitis. The small and large bowel are normal in caliber. The appendix
is at the upper limits of normal in caliber measuring 6-7 mm in diameter.
There are no adjacent inflammatory changes.

No aggressive lytic or sclerotic osseous lesions are identified.
IMPRESSION: 1. No renal calculi or hydronephrosis.
2. The appendix is at the upper limit of normal in diameter. There are no
adjacent inflammatory changes. However, correlate for early appendicitis.

## 2014-04-04 IMAGING — US US PELVIS LIMITED
1 series · 14 of 25 positions shown · non-contrast
Comparison: none

REASON FOR EXAM: l testicular paion
COMMENTS:

PROCEDURE:     US  - US TESTICULAR  - January 03, 2012  [DATE]
RESULT:     Comparison: None.
TECHNIQUE: Multiple grayscale and color Doppler images were obtained of the
testicles.

[Series 1: us pelvis limited · 0.08mm/px · 14 of 31 slices shown]
[im 1/31]
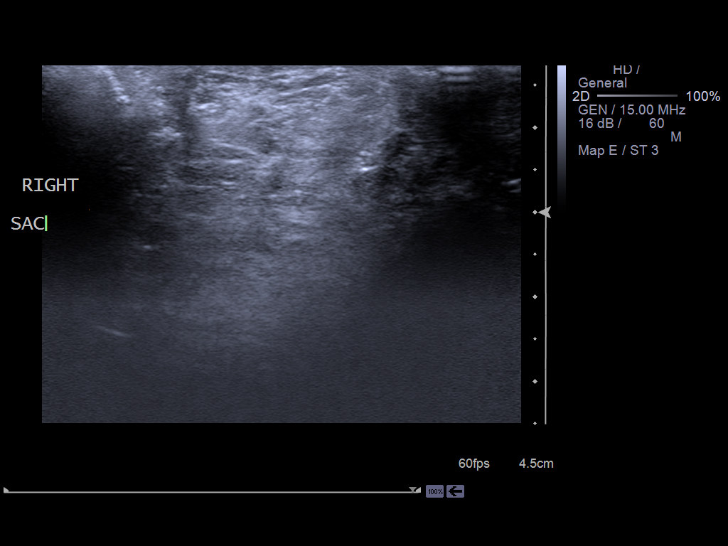
[im 3/31]
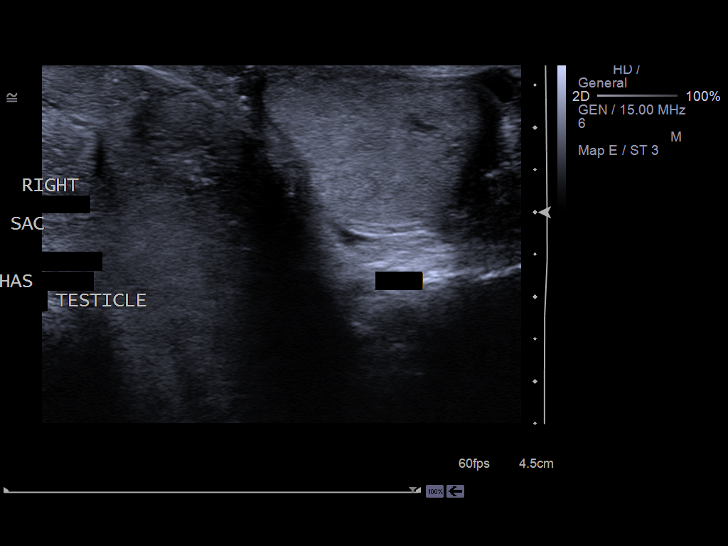
[im 6/31]
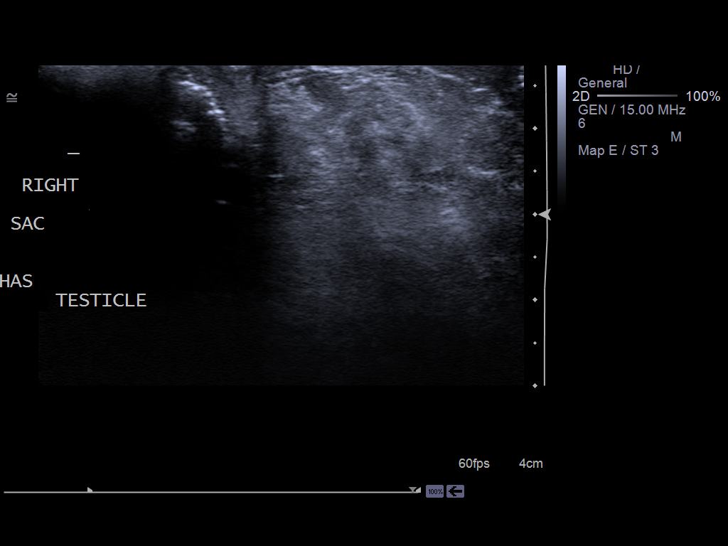
[im 8/31]
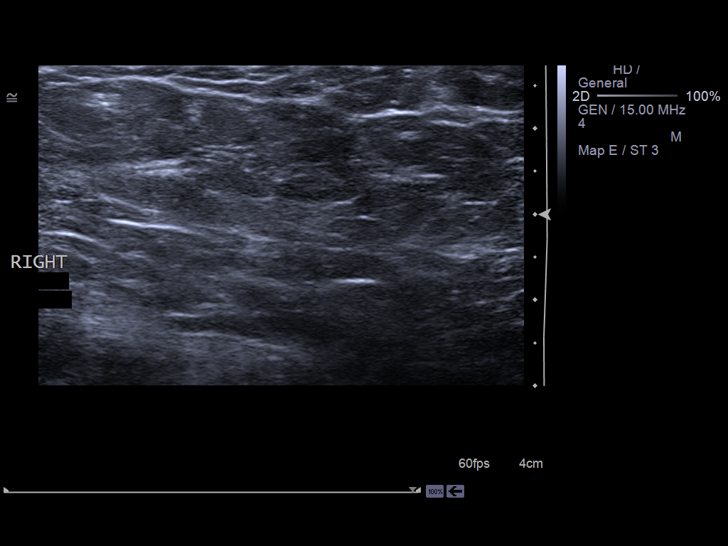
[im 11/31]
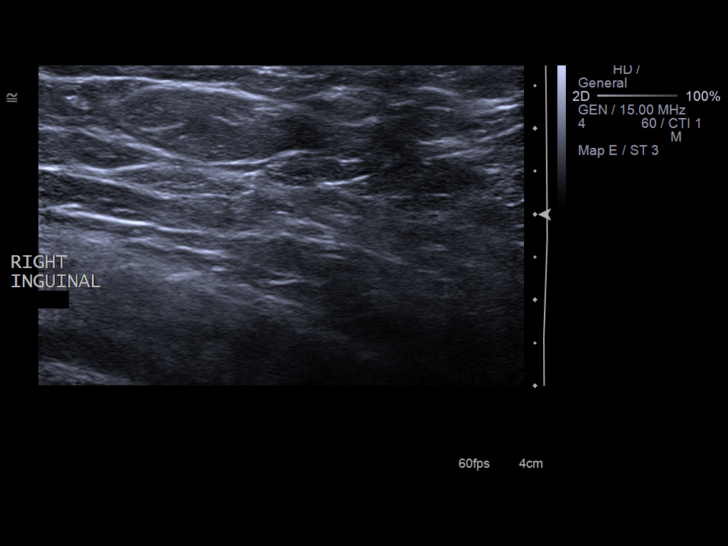
[im 12/31]
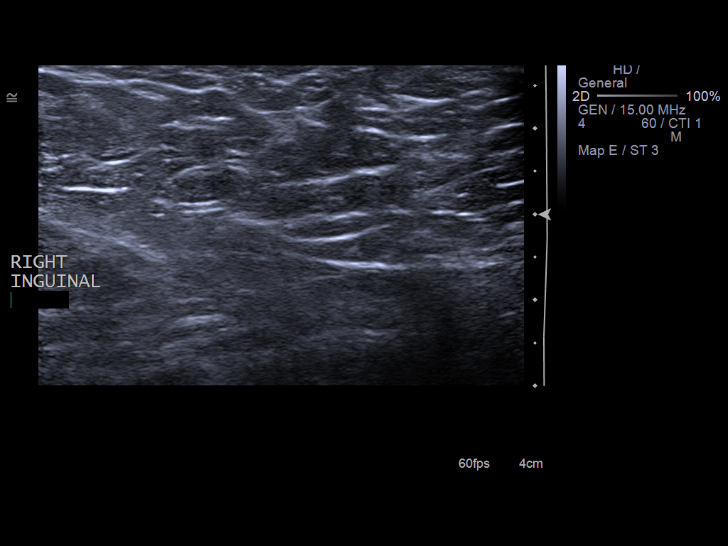
[im 14/31]
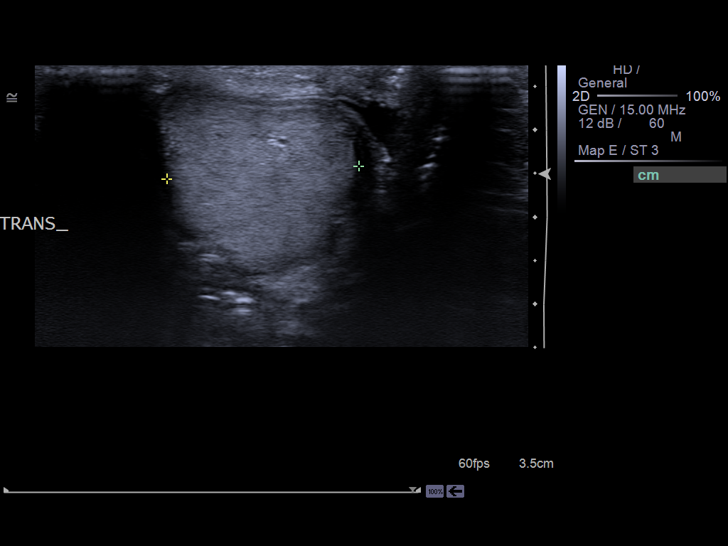
[im 17/31]
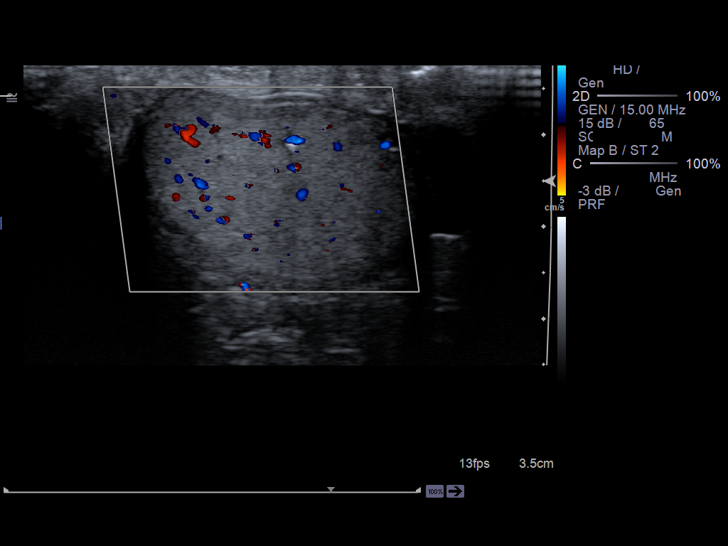
[im 19/31]
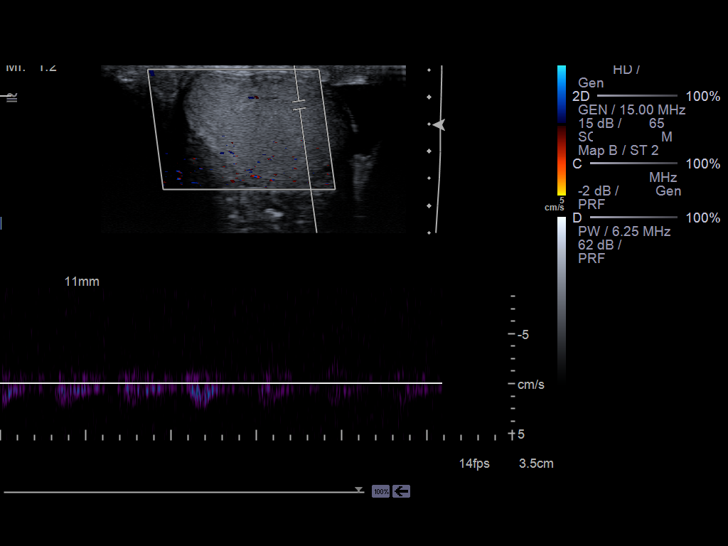
[im 21/31]
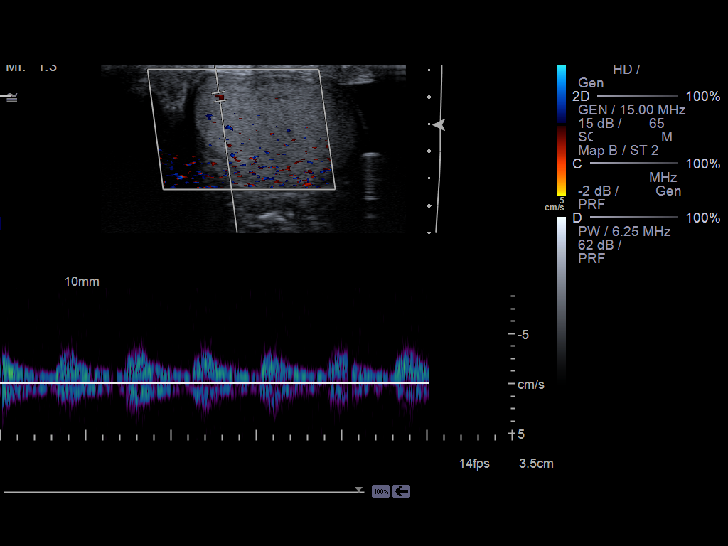
[im 23/31]
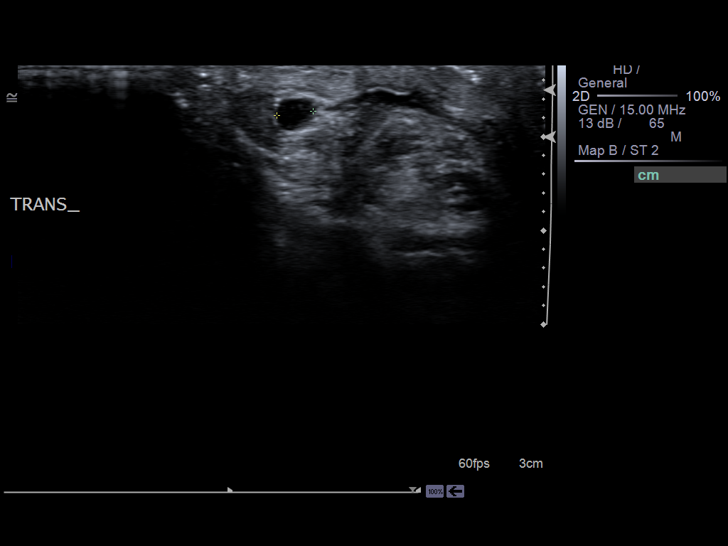
[im 26/31]
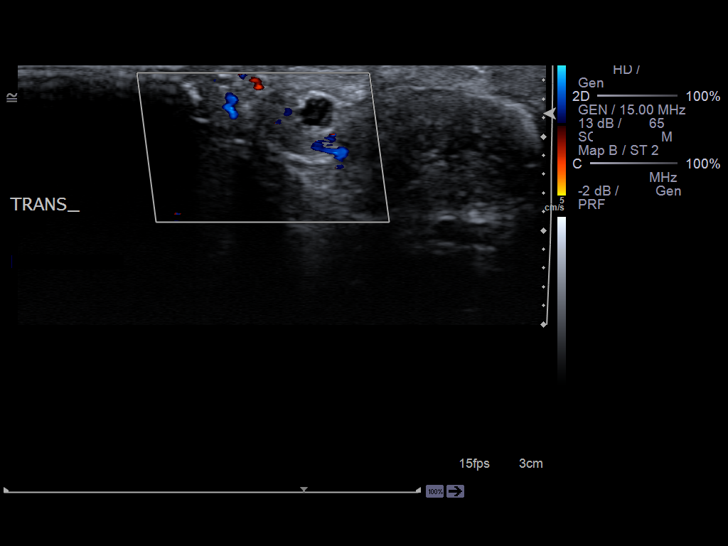
[im 28/31]
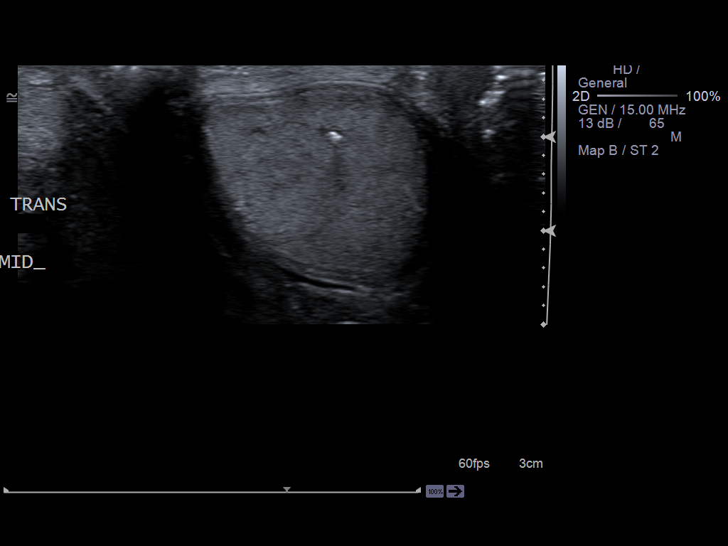
[im 31/31]
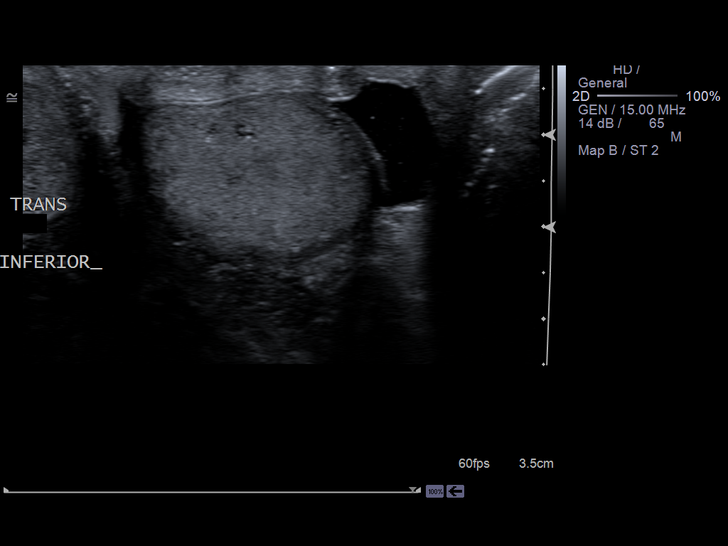

[14 of 25 positions shown; findings below may reference images not displayed]

FINDINGS: The right testicle was not identified. The patient stated he only has a left
testicle. The left testicle measures 3.0 x 2.2 x 1.9 cm. The left testicle
is homogeneous in echotexture. There is a single punctate calcification in
the left testicle. Vascular flow is penetrated in the left testicle. There
is a small left hydrocele. There is a small cyst in the epididymis.
IMPRESSION: 1. Small left hydrocele.
2. The right testicle is absent.

## 2014-04-06 IMAGING — US ABDOMEN ULTRASOUND
1 series · 14 of 25 positions shown · non-contrast
Comparison: none

REASON FOR EXAM: elevated LFTS
COMMENTS:

PROCEDURE:     US  - US ABDOMEN GENERAL SURVEY  - January 05, 2012 [DATE]
RESULT:     Comparison: None
TECHNIQUE: Multiple gray-scale and color-flow Doppler images of the abdomen
are presented for review.

[Series 1: abdomen ultrasound · 0.30mm/px · 14 of 110 slices shown]
[im 1/110]
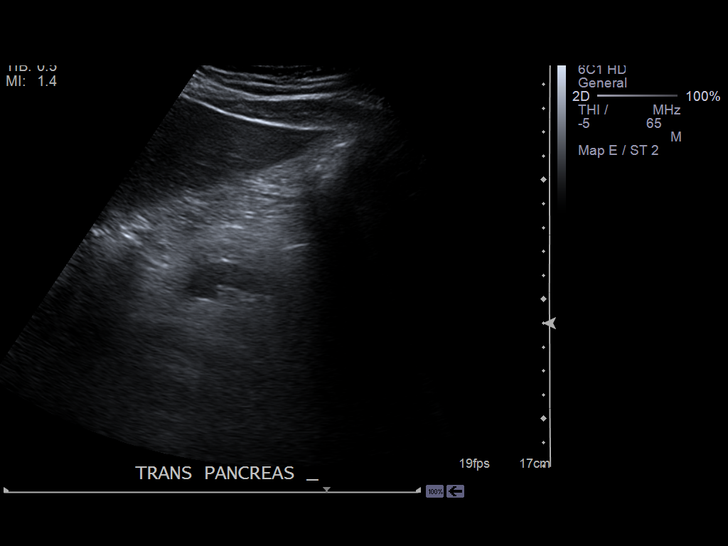
[im 10/110]
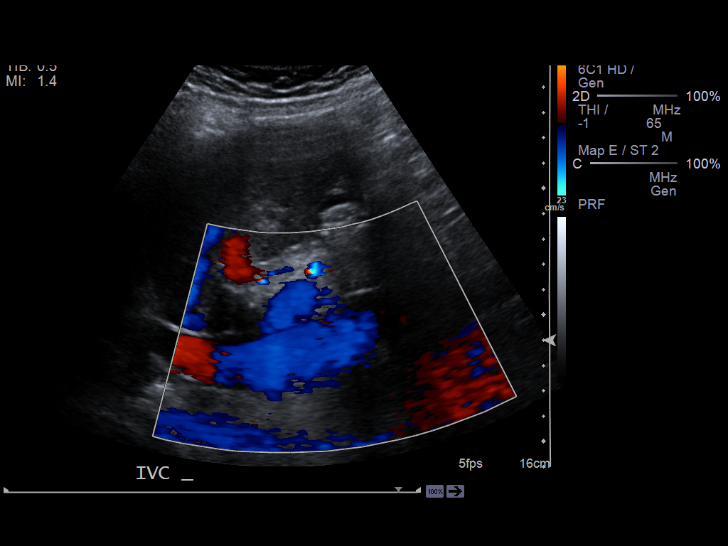
[im 19/110]
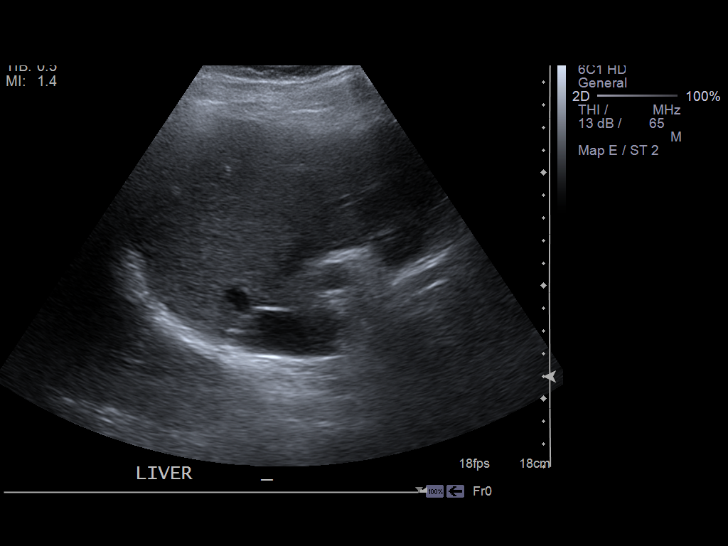
[im 28/110]
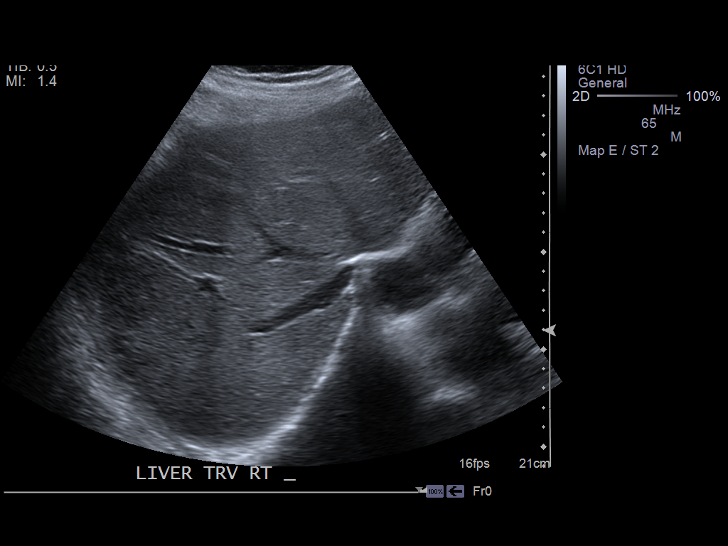
[im 37/110]
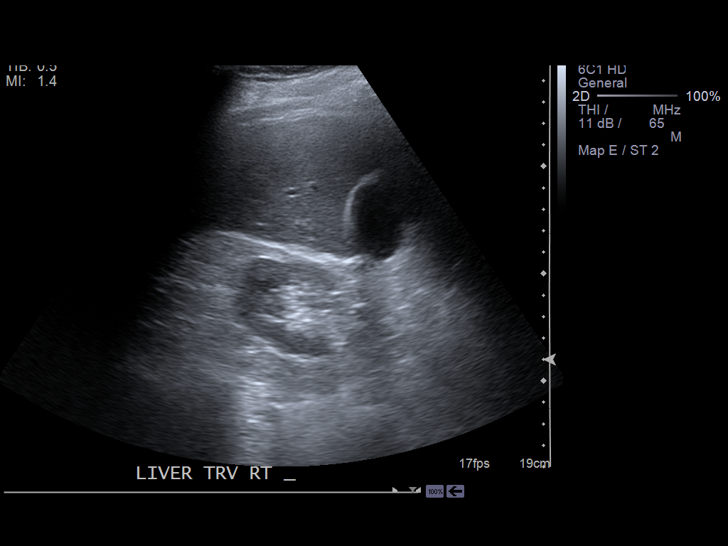
[im 41/110]
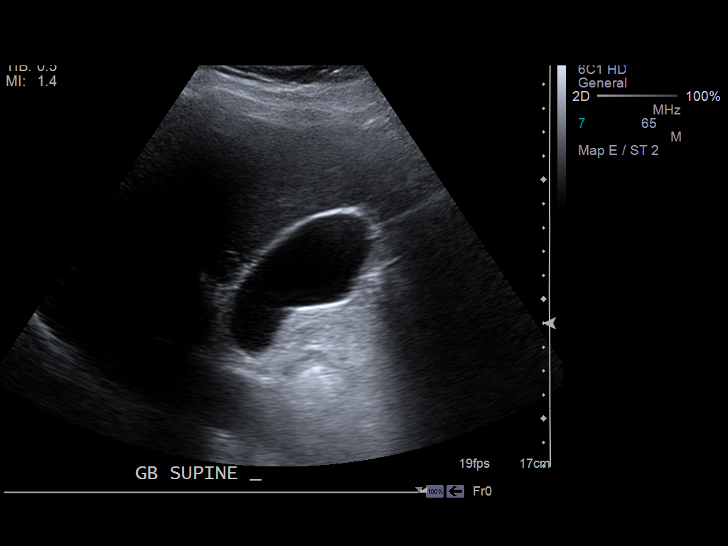
[im 50/110]
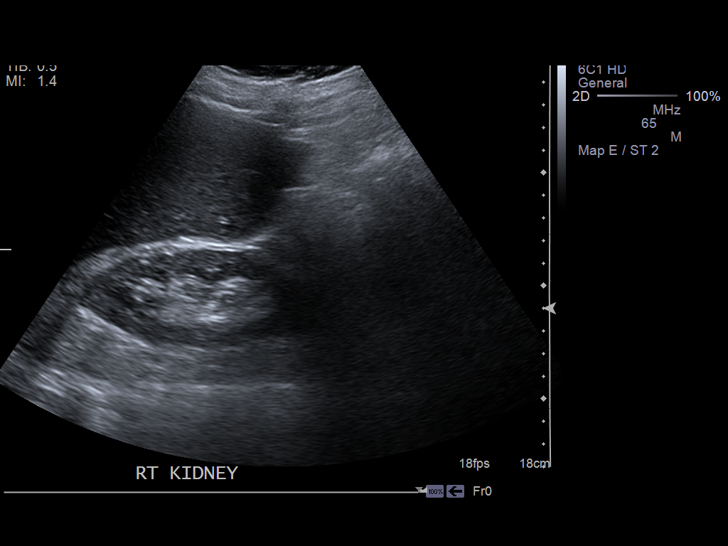
[im 60/110]
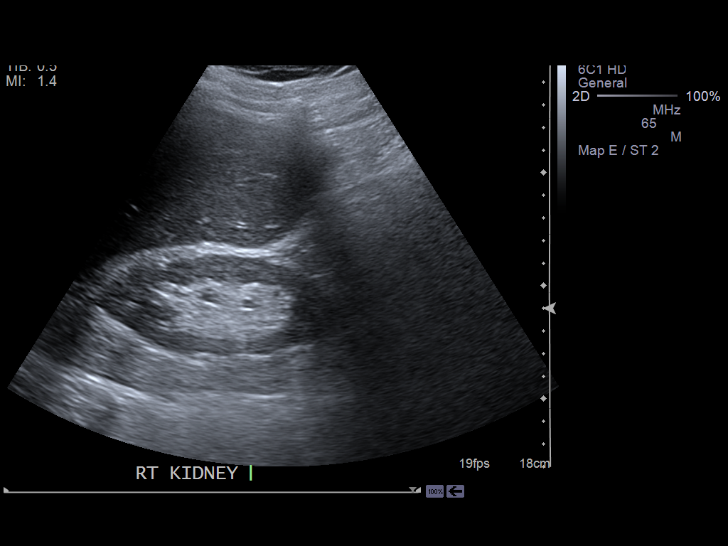
[im 69/110]
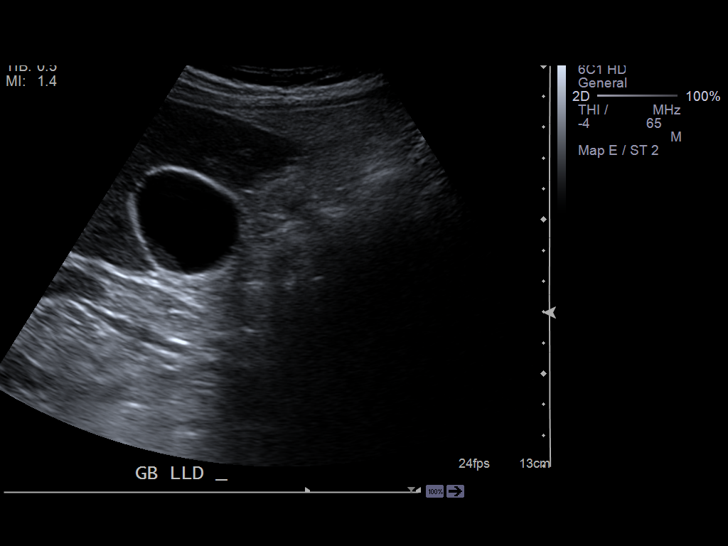
[im 73/110]
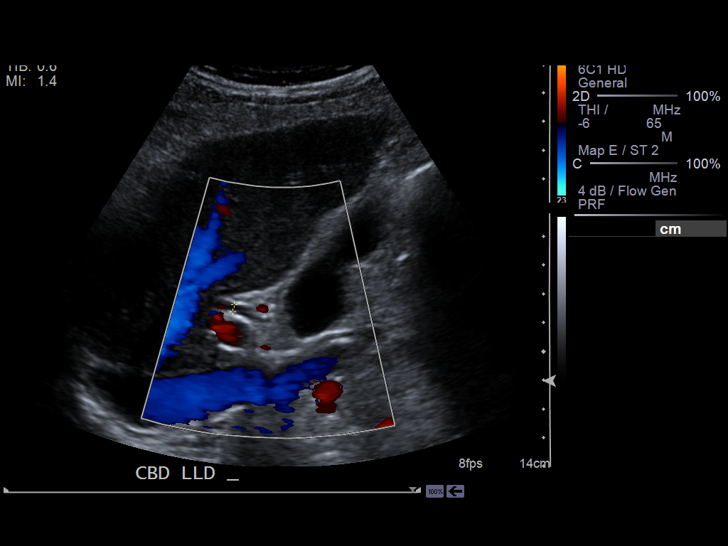
[im 82/110]
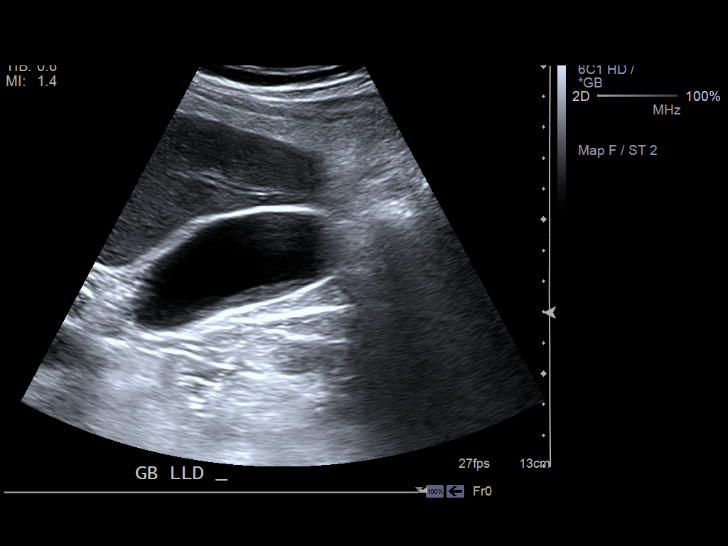
[im 91/110]
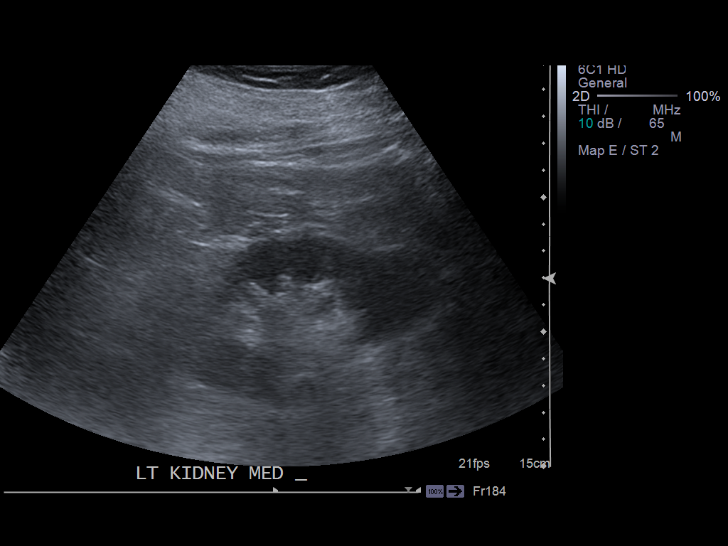
[im 100/110]
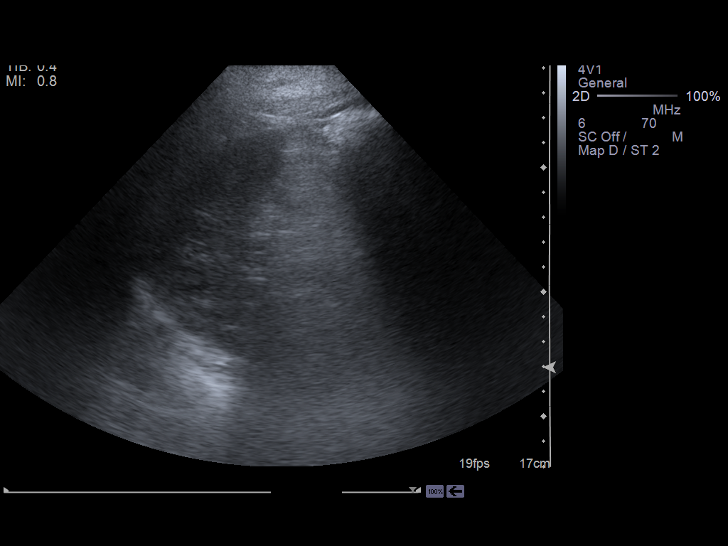
[im 110/110]
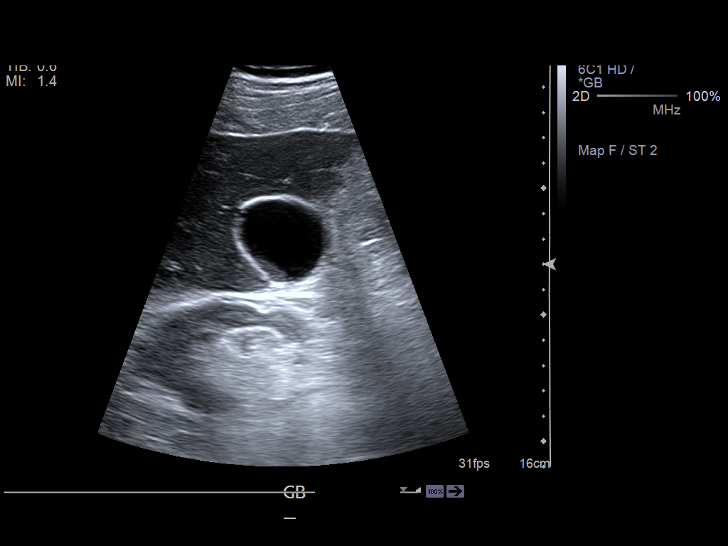

[14 of 25 positions shown; findings below may reference images not displayed]

FINDINGS: Visualized portions of the liver demonstrate normal echogenicity and normal
contours. The liver is without evidence of a focal hepatic lesion.

There is gallbladder sludge. There are no cholelithiasis. There is no intra-
or extrahepatic biliary ductal dilatation. The common duct measures 2.5 mm
in maximal diameter. There is no gallbladder wall thickening,
pericholecystic fluid, or sonographic Murphy's sign.

The visualized portion of the pancreas is normal in echogenicity. The spleen
is normal in size.  Bilateral kidneys are normal in echogenicity and size.
The right kidney measures 10.7 x 4.6 x 4.2 cm. The left kidney measures 11 x
3.6 x 4.4 cm. There are no renal calculi or hydronephrosis. The abdominal
aorta and IVC are unremarkable.
IMPRESSION: 1. Minimal gallbladder sludge. No cholelithiasis or sonographic evidence of
acute cholecystitis.

[REDACTED]
